# Patient Record
Sex: Female | Born: 1995 | Race: White | Hispanic: No | Marital: Single | Smoking: Never smoker
Health system: Southern US, Community
[De-identification: ages and names within clinical notes are randomized; demographics above are authoritative.]

## PROBLEM LIST (undated history)

## (undated) DIAGNOSIS — F329 Major depressive disorder, single episode, unspecified: Secondary | ICD-10-CM

## (undated) DIAGNOSIS — G43909 Migraine, unspecified, not intractable, without status migrainosus: Secondary | ICD-10-CM

## (undated) DIAGNOSIS — F419 Anxiety disorder, unspecified: Secondary | ICD-10-CM

## (undated) DIAGNOSIS — F410 Panic disorder [episodic paroxysmal anxiety] without agoraphobia: Secondary | ICD-10-CM

## (undated) DIAGNOSIS — F32A Depression, unspecified: Secondary | ICD-10-CM

## (undated) HISTORY — PX: ESOPHAGOGASTRODUODENOSCOPY: SHX1529

## (undated) HISTORY — PX: CHOLECYSTECTOMY: SHX55

---
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
## 2015-03-29 DIAGNOSIS — Z8659 Personal history of other mental and behavioral disorders: Secondary | ICD-10-CM

## 2015-03-29 DIAGNOSIS — F840 Autistic disorder: Secondary | ICD-10-CM | POA: Insufficient documentation

## 2015-03-29 HISTORY — DX: Personal history of other mental and behavioral disorders: Z86.59

## 2015-11-13 DIAGNOSIS — E282 Polycystic ovarian syndrome: Secondary | ICD-10-CM | POA: Insufficient documentation

## 2015-11-13 DIAGNOSIS — R5383 Other fatigue: Secondary | ICD-10-CM | POA: Insufficient documentation

## 2016-11-07 DIAGNOSIS — F410 Panic disorder [episodic paroxysmal anxiety] without agoraphobia: Secondary | ICD-10-CM | POA: Insufficient documentation

## 2016-11-07 DIAGNOSIS — F4000 Agoraphobia, unspecified: Secondary | ICD-10-CM

## 2016-11-07 HISTORY — DX: Agoraphobia, unspecified: F40.00

## 2017-04-20 DIAGNOSIS — F339 Major depressive disorder, recurrent, unspecified: Secondary | ICD-10-CM | POA: Insufficient documentation

## 2017-04-20 DIAGNOSIS — F330 Major depressive disorder, recurrent, mild: Secondary | ICD-10-CM | POA: Insufficient documentation

## 2017-07-18 ENCOUNTER — Inpatient Hospital Stay (HOSPITAL_COMMUNITY)
Admission: AD | Admit: 2017-07-18 | Discharge: 2017-07-23 | DRG: 885 | Disposition: A | Discharge status: Home / Self Care | Payer: Medicaid Other | Source: Intra-hospital | Attending: Psychiatry | Admitting: Psychiatry

## 2017-07-18 ENCOUNTER — Emergency Department (HOSPITAL_COMMUNITY)
Admission: EM | Admit: 2017-07-18 | Discharge: 2017-07-18 | Disposition: A | Discharge status: Other Acute Inpatient Hospital | Payer: Medicaid Other | Attending: Emergency Medicine | Admitting: Emergency Medicine

## 2017-07-18 ENCOUNTER — Encounter (HOSPITAL_COMMUNITY): Payer: Self-pay

## 2017-07-18 ENCOUNTER — Encounter (HOSPITAL_COMMUNITY): Payer: Self-pay | Admitting: Emergency Medicine

## 2017-07-18 ENCOUNTER — Other Ambulatory Visit: Payer: Self-pay

## 2017-07-18 DIAGNOSIS — R45851 Suicidal ideations: Secondary | ICD-10-CM | POA: Diagnosis present

## 2017-07-18 DIAGNOSIS — F419 Anxiety disorder, unspecified: Secondary | ICD-10-CM | POA: Diagnosis not present

## 2017-07-18 DIAGNOSIS — F840 Autistic disorder: Secondary | ICD-10-CM | POA: Diagnosis present

## 2017-07-18 DIAGNOSIS — F1099 Alcohol use, unspecified with unspecified alcohol-induced disorder: Secondary | ICD-10-CM | POA: Diagnosis not present

## 2017-07-18 DIAGNOSIS — R51 Headache: Secondary | ICD-10-CM | POA: Diagnosis not present

## 2017-07-18 DIAGNOSIS — R440 Auditory hallucinations: Secondary | ICD-10-CM | POA: Diagnosis not present

## 2017-07-18 DIAGNOSIS — F28 Other psychotic disorder not due to a substance or known physiological condition: Secondary | ICD-10-CM | POA: Diagnosis present

## 2017-07-18 DIAGNOSIS — F29 Unspecified psychosis not due to a substance or known physiological condition: Secondary | ICD-10-CM

## 2017-07-18 DIAGNOSIS — F329 Major depressive disorder, single episode, unspecified: Secondary | ICD-10-CM | POA: Insufficient documentation

## 2017-07-18 DIAGNOSIS — G43909 Migraine, unspecified, not intractable, without status migrainosus: Secondary | ICD-10-CM | POA: Diagnosis present

## 2017-07-18 DIAGNOSIS — G4700 Insomnia, unspecified: Secondary | ICD-10-CM | POA: Diagnosis present

## 2017-07-18 DIAGNOSIS — R519 Headache, unspecified: Secondary | ICD-10-CM

## 2017-07-18 DIAGNOSIS — F23 Brief psychotic disorder: Secondary | ICD-10-CM

## 2017-07-18 DIAGNOSIS — F410 Panic disorder [episodic paroxysmal anxiety] without agoraphobia: Secondary | ICD-10-CM | POA: Diagnosis present

## 2017-07-18 DIAGNOSIS — F411 Generalized anxiety disorder: Secondary | ICD-10-CM | POA: Insufficient documentation

## 2017-07-18 HISTORY — DX: Panic disorder (episodic paroxysmal anxiety): F41.0

## 2017-07-18 HISTORY — DX: Major depressive disorder, single episode, unspecified: F32.9

## 2017-07-18 HISTORY — DX: Migraine, unspecified, not intractable, without status migrainosus: G43.909

## 2017-07-18 HISTORY — DX: Unspecified psychosis not due to a substance or known physiological condition: F29

## 2017-07-18 HISTORY — DX: Depression, unspecified: F32.A

## 2017-07-18 HISTORY — DX: Anxiety disorder, unspecified: F41.9

## 2017-07-18 LAB — COMPREHENSIVE METABOLIC PANEL
ALBUMIN: 4.3 g/dL (ref 3.5–5.0)
ALK PHOS: 62 U/L (ref 38–126)
ALT: 21 U/L (ref 14–54)
AST: 24 U/L (ref 15–41)
Anion gap: 13 (ref 5–15)
BILIRUBIN TOTAL: 1 mg/dL (ref 0.3–1.2)
BUN: 10 mg/dL (ref 6–20)
CO2: 24 mmol/L (ref 22–32)
CREATININE: 1.02 mg/dL — AB (ref 0.44–1.00)
Calcium: 9.9 mg/dL (ref 8.9–10.3)
Chloride: 103 mmol/L (ref 101–111)
GFR calc Af Amer: >60 mL/min (ref >60)
GFR calc non Af Amer: >60 mL/min (ref >60)
GLUCOSE: 99 mg/dL (ref 65–99)
POTASSIUM: 4.1 mmol/L (ref 3.5–5.1)
Sodium: 140 mmol/L (ref 135–145)
TOTAL PROTEIN: 6.8 g/dL (ref 6.5–8.1)

## 2017-07-18 LAB — RAPID URINE DRUG SCREEN, HOSP PERFORMED
AMPHETAMINES: NOT DETECTED
BARBITURATES: NOT DETECTED
Benzodiazepines: POSITIVE — AB
Cocaine: NOT DETECTED
OPIATES: NOT DETECTED
Tetrahydrocannabinol: NOT DETECTED

## 2017-07-18 LAB — CBC
HEMATOCRIT: 44.5 % (ref 36.0–46.0)
Hemoglobin: 15.2 g/dL — ABNORMAL HIGH (ref 12.0–15.0)
MCH: 30.3 pg (ref 26.0–34.0)
MCHC: 34.2 g/dL (ref 30.0–36.0)
MCV: 88.6 fL (ref 78.0–100.0)
PLATELETS: 222 10*3/uL (ref 150–400)
RBC: 5.02 MIL/uL (ref 3.87–5.11)
RDW: 11.8 % (ref 11.5–15.5)
WBC: 7.9 10*3/uL (ref 4.0–10.5)

## 2017-07-18 LAB — I-STAT BETA HCG BLOOD, ED (MC, WL, AP ONLY)

## 2017-07-18 LAB — ACETAMINOPHEN LEVEL: Acetaminophen (Tylenol), Serum: <10 ug/mL — ABNORMAL LOW (ref 10–30)

## 2017-07-18 LAB — ETHANOL: Alcohol, Ethyl (B): <10 mg/dL (ref <10)

## 2017-07-18 LAB — SALICYLATE LEVEL: Salicylate Lvl: <7 mg/dL (ref 2.8–30.0)

## 2017-07-18 MED ORDER — IBUPROFEN 400 MG PO TABS
600.0000 mg | ORAL_TABLET | Freq: Four times a day (QID) | ORAL | Status: DC | PRN
  Administered 2017-07-19 – 2017-07-21 (×4): 600 mg via ORAL
  Filled 2017-07-18 (×5): qty 1

## 2017-07-18 MED ORDER — DIVALPROEX SODIUM ER 500 MG PO TB24
750.0000 mg | ORAL_TABLET | Freq: Every day | ORAL | Status: DC
  Administered 2017-07-18 – 2017-07-22 (×5): 750 mg via ORAL
  Filled 2017-07-18 (×7): qty 1

## 2017-07-18 MED ORDER — QUETIAPINE FUMARATE 25 MG PO TABS
25.0000 mg | ORAL_TABLET | Freq: Once | ORAL | Status: AC
  Administered 2017-07-18: 25 mg via ORAL
  Filled 2017-07-18 (×2): qty 1

## 2017-07-18 MED ORDER — METOCLOPRAMIDE HCL 5 MG/ML IJ SOLN
10.0000 mg | Freq: Once | INTRAMUSCULAR | Status: AC
  Administered 2017-07-18: 10 mg via INTRAVENOUS
  Filled 2017-07-18: qty 2

## 2017-07-18 MED ORDER — DIPHENHYDRAMINE HCL 50 MG/ML IJ SOLN
25.0000 mg | Freq: Once | INTRAMUSCULAR | Status: AC
  Administered 2017-07-18: 25 mg via INTRAVENOUS
  Filled 2017-07-18: qty 1

## 2017-07-18 MED ORDER — QUETIAPINE FUMARATE 100 MG PO TABS
100.0000 mg | ORAL_TABLET | Freq: Every day | ORAL | Status: DC
  Administered 2017-07-18: 100 mg via ORAL
  Filled 2017-07-18 (×3): qty 1

## 2017-07-18 MED ORDER — ENSURE ENLIVE PO LIQD
237.0000 mL | Freq: Two times a day (BID) | ORAL | Status: DC
  Administered 2017-07-18 – 2017-07-22 (×10): 237 mL via ORAL
  Filled 2017-07-18 (×3): qty 237

## 2017-07-18 MED ORDER — KETOROLAC TROMETHAMINE 30 MG/ML IJ SOLN
30.0000 mg | Freq: Once | INTRAMUSCULAR | Status: AC
  Administered 2017-07-18: 30 mg via INTRAVENOUS
  Filled 2017-07-18: qty 1

## 2017-07-18 MED ORDER — VALPROATE SODIUM 500 MG/5ML IV SOLN
500.0000 mg | Freq: Once | INTRAVENOUS | Status: AC
  Administered 2017-07-18: 500 mg via INTRAVENOUS
  Filled 2017-07-18: qty 5

## 2017-07-18 MED ORDER — LORAZEPAM 1 MG PO TABS
1.0000 mg | ORAL_TABLET | Freq: Four times a day (QID) | ORAL | Status: DC | PRN
Start: 2017-07-18 — End: 2017-07-19
  Administered 2017-07-18 – 2017-07-19 (×2): 1 mg via ORAL
  Filled 2017-07-18 (×2): qty 1

## 2017-07-18 MED ORDER — SODIUM CHLORIDE 0.9 % IV BOLUS
1000.0000 mL | Freq: Once | INTRAVENOUS | Status: AC
  Administered 2017-07-18: 1000 mL via INTRAVENOUS

## 2017-07-18 MED ORDER — FLUOXETINE HCL 20 MG PO CAPS
60.0000 mg | ORAL_CAPSULE | Freq: Every day | ORAL | Status: DC
  Administered 2017-07-18 – 2017-07-19 (×2): 60 mg via ORAL
  Filled 2017-07-18 (×5): qty 3

## 2017-07-18 MED ORDER — SUMATRIPTAN SUCCINATE 100 MG PO TABS
100.0000 mg | ORAL_TABLET | ORAL | Status: DC | PRN
  Administered 2017-07-19 – 2017-07-21 (×5): 100 mg via ORAL
  Filled 2017-07-18 (×3): qty 2
  Filled 2017-07-18: qty 1
  Filled 2017-07-18 (×2): qty 2

## 2017-07-18 MED ORDER — HYDROXYZINE HCL 25 MG PO TABS
25.0000 mg | ORAL_TABLET | ORAL | Status: DC | PRN
  Administered 2017-07-19 – 2017-07-21 (×4): 25 mg via ORAL
  Filled 2017-07-18 (×5): qty 1

## 2017-07-18 MED ORDER — PROCHLORPERAZINE MALEATE 5 MG PO TABS
10.0000 mg | ORAL_TABLET | Freq: Four times a day (QID) | ORAL | Status: DC | PRN
  Administered 2017-07-18 – 2017-07-20 (×4): 10 mg via ORAL
  Filled 2017-07-18 (×4): qty 1

## 2017-07-18 MED ORDER — DEXAMETHASONE SODIUM PHOSPHATE 10 MG/ML IJ SOLN
10.0000 mg | Freq: Once | INTRAMUSCULAR | Status: AC
  Administered 2017-07-18: 10 mg via INTRAVENOUS
  Filled 2017-07-18: qty 1

## 2017-07-18 NOTE — Tx Team (Signed)
Initial Treatment Plan 07/18/2017 1:45 PM [name] ZOX:[id number]    PATIENT STRESSORS: Health problems   PATIENT STRENGTHS: Financial means Motivation for treatment/growth Supportive family/friends   PATIENT IDENTIFIED PROBLEMS: Chronic migraine for past 2 months  Increasing depression and suicidal thoughts                   DISCHARGE CRITERIA:  Ability to meet basic life and health needs Improved stabilization in mood, thinking, and/or behavior  PRELIMINARY DISCHARGE PLAN: Outpatient therapy Return to previous living arrangement  PATIENT/FAMILY INVOLVEMENT: This treatment plan has been presented to and reviewed with the patient, Kammy Klett, and/or family member, Tria Noguera (mother).  The patient and family have been given the opportunity to ask questions and make suggestions.  Kirstie Mirza, RN 07/18/2017, 1:45 PM

## 2017-07-18 NOTE — [facility] Group Notes (Signed)
Life SKills   Date:  07/18/2017  Time:  4:08 PM  Type of Therapy:  Nurse Education  /  The group focuses on teaching patients how to identify their needs and then how to develp the skills needed to get them met.   Participation Level:  Active  Participation Quality:  Attentive  Affect:  Appropriate  Cognitive:  Appropriate  Insight:  Appropriate  Engagement in Group:  Engaged  Modes of Intervention:  Education  Summary of Progress/Problems:  Betty Rogers 07/18/2017, 4:08 PM

## 2017-07-18 NOTE — Progress Notes (Signed)
Per Julieanne Cotton, pt has been accepted to [facility] bed 403-1. Accepting provider is Shuvon Rankin.  Attending provider is [place]. Patient can arrive by 10:00 A.M. Number for report is [phone].  CSW has called [employer] with accepting information.   Moss Mc MSW, LCSW-A, LCAS-A Clinical Social Worker 07/18/2017 9:24 AM

## 2017-07-18 NOTE — H&P (Signed)
Psychiatric Admission Assessment Adult  Patient Identification: Betty Rogers MRN:  [id number] Date of Evaluation:  07/18/2017 Chief Complaint:  MDD with psychotic features Principal Diagnosis: <principal problem not specified> Diagnosis:   Patient Active Problem List   Diagnosis Date Noted  . Schizoaffective disorder ([place]) [F25.9] 07/18/2017   History of Present Illness: Patient is seen and examined.  Patient is a 22 year old female with a reported past psychiatric history significant for autism spectrum disorder, unspecified depression, unspecified anxiety, unspecified panic attacks, unspecified psychosis, migraine headaches and apparently tinnitus who originally presented to the [facility] emergency department with a chronic migraine headache.  Apparently the patient has had a migraine for the last several weeks to months.  She apparently had a medical hospitalization at [facility] approximately a week ago for this headache.  Apparently that hospitalization was not successful.  She presented to the emergency department with her mother on 5/18.  He was also for a headache evaluation.  The patient wanted her to have an MRI at that time.  The emergency room physician did not believe it was indicated.  During the course of the evaluation the patient also related having auditory and visual hallucinations.  There was also suicidal ideation with a threat to jump out of the car because the headache was so bad.  The ER gave the patient IV Depakote in the emergency room.  At the time of this evaluation in the psychiatric unit she denied having an active headache.  She did state that she was having auditory hallucinations.  Patient has autism spectrum disorder, and also appears to have some intellectual deficits.  She is not a very good historian.  The emergency room record showed the patient apparently did not have a psychiatric provider.  I contacted the patient's mother and got some collateral  history.  She is seen by psychiatric practice in [place].  It is a neuropsychiatric practice.  She stated that they had seen the provider approximately a month ago.  The patient was having the headache at that time, but "they did nothing".  The patient's mother was somewhat disorganized about the patient's medications.  We attempted to contact the [facility] pharmacy, but they were already closed.  The mother stated the patient had been taking Seroquel, and "had been on everything in the past".  When the mother delivered the list of medications to the nursing staff Seroquel was not on that record.  Review of chart everywhere showed she had previously been treated with Topamax, Seroquel, Prozac.  Her current Prozac dose was 80 mg p.o. daily.  Apparently that is not been successful.  The mother stated she was having panic attacks multiple times a day.  Review of chart everywhere is showed that the patient had been taking 300 mg of Seroquel at least in 2017.  She was admitted to the hospital for evaluation and stabilization. Associated Signs/Symptoms: Depression Symptoms:  depressed mood, anhedonia, psychomotor agitation, fatigue, feelings of worthlessness/guilt, difficulty concentrating, hopelessness, suicidal thoughts without plan, anxiety, panic attacks, loss of energy/fatigue, (Hypo) Manic Symptoms:  Impulsivity, Anxiety Symptoms:  Excessive Worry, Psychotic Symptoms:  Hallucinations: Auditory PTSD Symptoms: Negative Total Time spent with patient: 1 hour  Past Psychiatric History: Apparently the patient has not had any psychiatric hospitalizations in the past.  This would be the first.  She has been followed psychiatrically.  It appears as though she had been seen at the [facility] for her autism spectrum disorder.  Is the patient at risk  to self? No.  Has the patient been a risk to self in the past 6 months? Yes.    Has the patient been a risk to self  within the distant past? No.  Is the patient a risk to others? No.  Has the patient been a risk to others in the past 6 months? No.  Has the patient been a risk to others within the distant past? No.   Prior Inpatient Therapy:   Prior Outpatient Therapy:    Alcohol Screening: 1. How often do you have a drink containing alcohol?: Monthly or less 2. How many drinks containing alcohol do you have on a typical day when you are drinking?: 1 or 2 3. How often do you have six or more drinks on one occasion?: Never AUDIT-C Score: 1 4. How often during the last year have you found that you were not able to stop drinking once you had started?: Never 5. How often during the last year have you failed to do what was normally expected from you becasue of drinking?: Never 6. How often during the last year have you needed a first drink in the morning to get yourself going after a heavy drinking session?: Never 7. How often during the last year have you had a feeling of guilt of remorse after drinking?: Never 8. How often during the last year have you been unable to remember what happened the night before because you had been drinking?: Never 9. Have you or someone else been injured as a result of your drinking?: No 10. Has a relative or friend or a doctor or another health worker been concerned about your drinking or suggested you cut down?: No Alcohol Use Disorder Identification Test Final Score (AUDIT): 1 Substance Abuse History in the last 12 months:  No. Consequences of Substance Abuse: Negative Previous Psychotropic Medications: Yes  Psychological Evaluations: Yes  Past Medical History:  Past Medical History:  Diagnosis Date  . Anxiety and depression   . Migraines   . Panic attacks     Past Surgical History:  Procedure Laterality Date  . CHOLECYSTECTOMY    . CHOLECYSTECTOMY     Family History: History reviewed. No pertinent family history. Family Psychiatric  History: Apparently there is a  history of bipolar disorder in her family. Tobacco Screening: Have you used any form of tobacco in the last 30 days? (Cigarettes, Smokeless Tobacco, Cigars, and/or Pipes): No Social History:  Social History   Substance and Sexual Activity  Alcohol Use Yes  . Frequency: Never   Comment: "a half a wine cooler occasionally"     Social History   Substance and Sexual Activity  Drug Use Never    Additional Social History: Marital status: Single Are you sexually active?: Yes What is your sexual orientation?: Bisexual - prefer guys. Has your sexual activity been affected by drugs, alcohol, medication, or emotional stress?: N/A Does patient have children?: No                         Allergies:   Allergies  Allergen Reactions  . Nickel     Rash  . Other Itching    Metal    Lab Results:  Results for orders placed or performed during the hospital encounter of 07/18/17 (from the past 48 hour(s))  Comprehensive metabolic panel     Status: Abnormal   Collection Time: 07/18/17  5:35 AM  Result Value Ref Range   Sodium 140 135 -  145 mmol/L   Potassium 4.1 3.5 - 5.1 mmol/L   Chloride 103 101 - 111 mmol/L   CO2 24 22 - 32 mmol/L   Glucose, Bld 99 65 - 99 mg/dL   BUN 10 6 - 20 mg/dL   Creatinine, Ser 1.02 (H) 0.44 - 1.00 mg/dL   Calcium 9.9 8.9 - 10.3 mg/dL   Total Protein 6.8 6.5 - 8.1 g/dL   Albumin 4.3 3.5 - 5.0 g/dL   AST 24 15 - 41 U/L   ALT 21 14 - 54 U/L   Alkaline Phosphatase 62 38 - 126 U/L   Total Bilirubin 1.0 0.3 - 1.2 mg/dL   GFR calc non Af Amer >60 >60 mL/min   GFR calc Af Amer >60 >60 mL/min    Comment: (NOTE) The eGFR has been calculated using the CKD EPI equation. This calculation has not been validated in all clinical situations. eGFR's persistently <60 mL/min signify possible Chronic Kidney Disease.    Anion gap 13 5 - 15    Comment: Performed at [facility] [address] [place], [place] [address]  Ethanol     Status: None   Collection  Time: 07/18/17  5:35 AM  Result Value Ref Range   Alcohol, Ethyl (B) <10 <10 mg/dL    Comment: (NOTE) Lowest detectable limit for serum alcohol is 10 mg/dL. For medical purposes only. Performed at [facility], [place] [address], [place], [place] [address]   Salicylate level     Status: None   Collection Time: 07/18/17  5:35 AM  Result Value Ref Range   Salicylate Lvl <0.6 2.8 - 30.0 mg/dL    Comment: Performed at [facility] [address], [place], [place] [address]  Acetaminophen level     Status: Abnormal   Collection Time: 07/18/17  5:35 AM  Result Value Ref Range   Acetaminophen (Tylenol), Serum <10 (L) 10 - 30 ug/mL    Comment: (NOTE) Therapeutic concentrations vary significantly. A range of 10-30 ug/mL  may be an effective concentration for many patients. However, some  are best treated at concentrations outside of this range. Acetaminophen concentrations >150 ug/mL at 4 hours after ingestion  and >50 ug/mL at 12 hours after ingestion are often associated with  toxic reactions. Performed at [facility], [place] [address] [place], [place] [address]   cbc     Status: Abnormal   Collection Time: 07/18/17  5:35 AM  Result Value Ref Range   WBC 7.9 4.0 - 10.5 K/uL   RBC 5.02 3.87 - 5.11 MIL/uL   Hemoglobin 15.2 (H) 12.0 - 15.0 g/dL   HCT 44.5 36.0 - 46.0 %   MCV 88.6 78.0 - 100.0 fL   MCH 30.3 26.0 - 34.0 pg   MCHC 34.2 30.0 - 36.0 g/dL   RDW 11.8 11.5 - 15.5 %   Platelets 222 150 - 400 K/uL    Comment: Performed at [facility], [place] [address], [place], [place] [address]  Rapid urine drug screen (hospital performed)     Status: Abnormal   Collection Time: 07/18/17  5:38 AM  Result Value Ref Range   Opiates NONE DETECTED NONE DETECTED   Cocaine NONE DETECTED NONE DETECTED   Benzodiazepines POSITIVE (A) NONE DETECTED   Amphetamines NONE DETECTED NONE DETECTED   Tetrahydrocannabinol NONE DETECTED NONE DETECTED   Barbiturates NONE DETECTED NONE  DETECTED    Comment: (NOTE) DRUG SCREEN FOR MEDICAL PURPOSES ONLY.  IF CONFIRMATION IS NEEDED FOR ANY PURPOSE, NOTIFY LAB WITHIN 5 DAYS. LOWEST DETECTABLE LIMITS FOR URINE DRUG SCREEN Drug Class                     Cutoff (ng/mL) Amphetamine and metabolites    1000 Barbiturate and metabolites    200 Benzodiazepine                 191 Tricyclics and metabolites     300 Opiates and metabolites        300 Cocaine and metabolites        300 THC                            50 Performed at [facility], [place] [address], [place], [place] [address]   I-Stat beta hCG blood, ED     Status: None   Collection Time: 07/18/17  5:49 AM  Result Value Ref Range   I-stat hCG, quantitative <5.0 <5 mIU/mL   Comment 3            Comment:   GEST. AGE      CONC.  (mIU/mL)   <=1 WEEK        5 - 50     2 WEEKS       50 - 500     3 WEEKS       100 - 10,000     4 WEEKS     1,000 - 30,000        FEMALE AND NON-PREGNANT FEMALE:     LESS THAN 5 mIU/mL     Blood Alcohol level:  Lab Results  Component Value Date   ETH <10 04/59/9774    Metabolic Disorder Labs:  No results found for: HGBA1C, MPG No results found for: PROLACTIN No results found for: CHOL, TRIG, HDL, CHOLHDL, VLDL, LDLCALC  Current Medications: Current Facility-Administered Medications  Medication Dose Route Frequency Provider Last Rate Last Dose  . divalproex (DEPAKOTE ER) 24 hr tablet 750 mg  750 mg Oral Q supper Sharma Covert, MD      . feeding supplement (ENSURE ENLIVE) (ENSURE ENLIVE) liquid 237 mL  237 mL Oral BID BM Sharma Covert, MD   237 mL at 07/18/17 1450  . FLUoxetine (PROZAC) capsule 60 mg  60 mg Oral Daily Sharma Covert, MD      . hydrOXYzine (ATARAX/VISTARIL) tablet 25 mg  25 mg Oral Q4H PRN Sharma Covert, MD      . ibuprofen (ADVIL,MOTRIN) tablet 600 mg  600 mg Oral Q6H PRN Rankin, Shuvon B, NP      . LORazepam (ATIVAN) tablet 1 mg  1 mg Oral Q6H PRN Sharma Covert, MD      .  prochlorperazine (COMPAZINE) tablet 10 mg  10 mg Oral Q6H PRN Sharma Covert, MD      . QUEtiapine (SEROQUEL) tablet 100 mg  100 mg Oral QHS Sharma Covert, MD      . QUEtiapine (SEROQUEL) tablet 25 mg  25 mg Oral Once Sharma Covert, MD      . SUMAtriptan Dellis Filbert) tablet 100 mg  100 mg Oral Q2H PRN Sharma Covert, MD       PTA Medications: Medications Prior to Admission  Medication Sig Dispense Refill Last Dose  . divalproex (DEPAKOTE) 500 MG DR tablet Take 500 mg by mouth 2 (two) times daily.  07/17/2017 at Unknown time  . FLUoxetine (PROZAC) 40 MG capsule Take 40 mg by mouth daily.   07/17/2017 at Unknown time  . hydrOXYzine (ATARAX/VISTARIL) 10 MG tablet Take 10 mg by mouth 2 (two) times daily as needed for anxiety.   07/17/2017 at Unknown time  . LORazepam (ATIVAN) 1 MG tablet Take 1 mg by mouth 2 (two) times daily.   07/18/2017 at Unknown time  . prochlorperazine (COMPAZINE) 10 MG tablet Take 10 mg by mouth every 8 (eight) hours as needed for nausea or vomiting.   07/17/2017 at Unknown time  . SUMAtriptan (IMITREX) 100 MG tablet Take 50 mg by mouth every 2 (two) hours as needed for migraine. May repeat in 2 hours if headache persists or recurs.   07/18/2017 at Unknown time    Musculoskeletal: Strength & Muscle Tone: within normal limits Gait & Station: normal Patient leans: N/A  Psychiatric Specialty Exam: Physical Exam  Nursing note and vitals reviewed. Constitutional: She is oriented to person, place, and time. She appears well-developed and well-nourished.  HENT:  Head: Normocephalic and atraumatic.  Respiratory: Effort normal.  Neurological: She is alert and oriented to person, place, and time.    ROS  There were no vitals taken for this visit.There is no height or weight on file to calculate BMI.  General Appearance: Disheveled  Eye Contact:  Minimal  Speech:  Normal Rate  Volume:  Decreased  Mood:  Dysphoric  Affect:  Blunt  Thought Process:  Coherent   Orientation:  Full (Time, Place, and Person)  Thought Content:  Hallucinations: Auditory  Suicidal Thoughts:  Yes.  without intent/plan  Homicidal Thoughts:  No  Memory:  Immediate;   Fair  Judgement:  Impaired  Insight:  Lacking  Psychomotor Activity:  Increased  Concentration:  Concentration: Fair  Recall:  [employer] of Knowledge:  Fair  Language:  Fair  Akathisia:  Negative  Handed:  Right  AIMS (if indicated):     Assets:  Desire for Improvement  ADL's:  Intact  Cognition:  WNL  Sleep:       Treatment Plan Summary: Daily contact with patient to assess and evaluate symptoms and progress in treatment, Medication management and Plan Patient is seen and examined.  Patient is a 22 year old female with the above-stated past psychiatric history is admitted to the hospital because of suicidal ideation.  She has been previously treated with Seroquel, Topamax and Prozac.  Her current Prozac dosage was 80 mg, but apparently does not appear to be effective.  I am going to begin weaning her off of it.  We will decrease her dose to 60 mg a day today, and then hopefully get her off in the next couple of days to use a different antidepressant/antianxiety medicine.  Is unclear whether or not she is actively taking Seroquel, but I am going to go on and restarted at 100 mg p.o. nightly.  We will titrate that over the next several days.  We will also continue the Depakote at 750 mg p.o. nightly.  I am also going to increase her Ativan 1 mg p.o. 3 times daily as needed for anxiety.  We can continue the Vistaril.  Compazine and Imitrex will be available for migraine headache problems.  We will continue to try and contact her pharmacy to get an adequate current list of all of her medications and think she may have been treated with in the past.  On Monday we will contact her local psychiatric provider to  get a more clear history of what is been going on most recently.  Observation Level/Precautions:  15  minute checks  Laboratory:  Chemistry Profile  Psychotherapy:    Medications:    Consultations:    Discharge Concerns:    Estimated LOS:  Other:     Physician Treatment Plan for Primary Diagnosis: <principal problem not specified> Long Term Goal(s): Improvement in symptoms so as ready for discharge  Short Term Goals: Ability to identify changes in lifestyle to reduce recurrence of condition will improve, Ability to verbalize feelings will improve, Ability to disclose and discuss suicidal ideas, Ability to demonstrate self-control will improve, Ability to identify and develop effective coping behaviors will improve, Ability to maintain clinical measurements within normal limits will improve and Compliance with prescribed medications will improve  Physician Treatment Plan for Secondary Diagnosis: Active Problems:   Schizoaffective disorder ([place])  Long Term Goal(s): Improvement in symptoms so as ready for discharge  Short Term Goals: Ability to identify changes in lifestyle to reduce recurrence of condition will improve, Ability to verbalize feelings will improve, Ability to disclose and discuss suicidal ideas, Ability to demonstrate self-control will improve, Ability to identify and develop effective coping behaviors will improve, Ability to maintain clinical measurements within normal limits will improve and Compliance with prescribed medications will improve  I certify that inpatient services furnished can reasonably be expected to improve the patient's condition.    Sharma Covert, MD 5/18/20193:34 PM

## 2017-07-18 NOTE — ED Provider Notes (Addendum)
TIME SEEN: 6:08 AM  CHIEF COMPLAINT: Suicidal thoughts, command hallucinations, headache  HPI: Patient is a 22 year old female with history of depression, migraines who presents to the emergency department with complaints of suicidal thoughts for the past day with command hallucinations telling her to harm herself.  She states that on the way here she had a plan to jump out of the car.  No homicidal thoughts.  No history of previous suicide attempt.  Patient also complaining of a posterior headache.  Describes it as throbbing in nature and gradual in onset.  Feels different than her typical migraines because her migraines are usually in the top of her head.  She has had some tinnitus with this headache.  It does cause her to have nausea and vomiting.  Has tried Imitrex at home without relief.  Was seen at [facility] for the same and had a head CT which was unremarkable.  ROS: See HPI Constitutional: no fever  Eyes: no drainage  ENT: no runny nose   Cardiovascular:  no chest pain  Resp: no SOB  GI: no vomiting GU: no dysuria Integumentary: no rash  Allergy: no hives  Musculoskeletal: no leg swelling  Neurological: no slurred speech ROS otherwise negative  PAST MEDICAL HISTORY/PAST SURGICAL HISTORY:  Past Medical History:  Diagnosis Date  . Anxiety and depression   . Migraines   . Panic attacks     MEDICATIONS:  Prior to Admission medications   Not on File    ALLERGIES:  Not on File  SOCIAL HISTORY:  Social History   Tobacco Use  . Smoking status: Never Smoker  . Smokeless tobacco: Never Used  Substance Use Topics  . Alcohol use: Never    Frequency: Never    FAMILY HISTORY: No family history on file.  EXAM: BP 130/81 (BP Location: Right Arm)   Pulse (!) 113   Temp 98.5 F (36.9 C) (Oral)   Resp 20   Ht  (1.626 m)   Wt 74.8 kg (165 lb)   SpO2 100%   BMI 28.32 kg/m  CONSTITUTIONAL: Alert and oriented and responds appropriately to questions.  Well-appearing; well-nourished, pacing the hallway, in no significant distress, appears anxious HEAD: Normocephalic EYES: Conjunctivae clear, pupils appear equal, EOMI ENT: normal nose; moist mucous membranes NECK: Supple, no meningismus, no nuchal rigidity, no LAD  CARD: RRR; S1 and S2 appreciated; no murmurs, no clicks, no rubs, no gallops RESP: Normal chest excursion without splinting or tachypnea; breath sounds clear and equal bilaterally; no wheezes, no rhonchi, no rales, no hypoxia or respiratory distress, speaking full sentences ABD/GI: Normal bowel sounds; non-distended; soft, non-tender, no rebound, no guarding, no peritoneal signs, no hepatosplenomegaly BACK:  The back appears normal and is non-tender to palpation, there is no CVA tenderness EXT: Normal ROM in all joints; non-tender to palpation; no edema; normal capillary refill; no cyanosis, no calf tenderness or swelling    SKIN: Normal color for age and race; warm; no rash NEURO: Moves all extremities equally, normal sensation diffusely, cranial nerves II through XII intact, normal speech, normal gait PSYCH: Patient here with suicidal thoughts with plan.  Has command hallucinations.  No HI.  MEDICAL DECISION MAKING: Patient here with complaints of headache.  I feel that this seems like a migraine but mother disagrees.  Mother is demanding an MRI of the patient's brain in the emergency department.  I discussed with her that given no fever, focal neurologic deficits, head injury and not being on blood thinners that  at this time she does not need an emergent MRI but this can be done as an outpatient.  Mother is upset with this plan and states "that is going to take [place] months".  Have recommended close outpatient neurology follow-up.  We will treat headache with migraine cocktail (Toradol, Reglan, Benadryl, Decadron, IV fluid).   As for patient's suicidal ideation, will obtain screening labs and urine and consult TTS.  She is here  voluntarily.  I feel she would benefit from inpatient psychiatric admission.  ED PROGRESS: Patient's work-up unremarkable other than drug screen being positive for benzodiazepines.  At this time patient is medically cleared.  Awaiting TTS consult.   I reviewed all nursing notes, vitals, pertinent previous records, EKGs, lab and urine results, imaging (as available).     Corrin Hingle, Layla Maw, DO 07/18/17 0705   7:30 AM  Pt still complaining of headache after migraine cocktail.  Will give IV Depakote.  I am not concerned for any life-threatening illness at this time.    Siana Panameno, Layla Maw, DO 07/18/17 0732    Persephanie Laatsch, Layla Maw, DO 07/18/17 1610

## 2017-07-18 NOTE — Progress Notes (Addendum)
D: Patient arrived today to [facility] from [facility] ED as voluntary patient. Patient reports chronic migraine lasting about 2 months, which has not resolved with treatment. She has become increasingly depressed due to the level of pain (10/10), with suicidal thoughts and a plan to stab herself with a knife or jump in front of a car. Patient reports command auditory hallucinations telling herself to harm herself, but denies having suicidal thoughts of her own. Patient reports poor appetite, panic attacks, and no sleep for past 2 days. Her mother is her guardian. Patient has prior diagnosis of MDD, treated with prozac. UDS positive for benzos, however, mother states she takes Ativan twice daily. Patient reports tinnitus. A: Skin assessment performed by Clayborne Dana, RN per protocol, no contraband noted. Skin unremarkable.  Patient had no belongings at time of admission. Unit orientation completed. Care plan and unit routines reviewed with patient, understanding verbalized. Emotional support offered to patient. Encouraged patient to voice concerns. Fluids offered to patient. Q15 minute checks initiated for safety on and off unit. Notified and updated her guardian/mother. R: Patient acknowledged understanding. Patient contracts for safety. Continuing to have SI, with no plan or intent at this time.

## 2017-07-18 NOTE — BHH Group Notes (Signed)
Life SKills   Date:18 Jul 2017  The group focuses on teaching patients how to identify their needs and then hwo to develop skills needed to get those needs met.  Participation Level:  Active  Participation Quality:  Attentive  Affect:  Defensive  Cognitive:  Appropriate  Insight:  Improving  Engagement in Group:  Engaged  Modes of Intervention:  Education  Summary of Progress/Problems:  Lauralyn Primes 07/18/2017, 4:11 PM

## 2017-07-18 NOTE — BHH Counselor (Signed)
Adult Comprehensive Assessment  Patient ID: Betty Rogers, female   DOB: 1996-01-30, 22 y.o.   MRN: [id number]  Information Source: Information source: Patient  Current Stressors:  Educational / Learning stressors: Pt reports it is stressful but is not in school now.  Employment / Job issues: Pt reports I can't hold a job because I get stressed out.  Family Relationships: My younger brother and I struggle. Financial / Lack of resources (include bankruptcy): Pt reports she has social security so that covers my needs. Housing / Lack of housing: Pt pays rent to mom and dad. Pt likes housing arrangements.  Physical health (include injuries & life threatening diseases): Headaches (daily) , voices (daily) and constant rigning in my ear.  Social relationships: I don't have much since graduating from [employer]. I used to have one friend before I moved here from [place].  Substance abuse: Denies Bereavement / Loss: Denies   Living/Environment/Situation:  Living Arrangements: Parent Living conditions (as described by patient or guardian): Like where I live, safe environment.  How long has patient lived in current situation?: 8 years What is atmosphere in current home: Comfortable, Loving  Family History:  Marital status: Single Are you sexually active?: Yes What is your sexual orientation?: Bisexual - prefer guys. Has your sexual activity been affected by drugs, alcohol, medication, or emotional stress?: N/A Does patient have children?: No  Childhood History:  By whom was/is the patient raised?: Mother, Father Description of patient's relationship with caregiver when they were a child: Mom - very close. Dad - not as close as mom but still closed  Patient's description of current relationship with people who raised him/her: Mom - she really close. Dad- the same.  How were you disciplined when you got in trouble as a child/adolescent?: grounding, and spankings when younger Does patient  have siblings?: Yes Number of Siblings: 3 Description of patient's current relationship with siblings: Younger brother - struggle. Older - close. Middle brother - not as close as the oldest but closer then younger brother.  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   Employment/Work Situation:   Employment situation: On disability Why is patient on disability: I can't take care of myself when situations come up.  How long has patient been on disability: 2 months What is the longest time patient has a held a job?: 2.5 years Where was the patient employed at that time?: [employer]  Has patient ever been in the [employer]?: No Are There Guns or Other Weapons in Your Home?: Yes Types of Guns/Weapons: I don't know. They are locked in a safe.  Are These Weapons Safely Secured?: Yes  Financial Resources:   Financial resources: Support from parents / caregiver(Disability) Does patient have a representative payee or guardian?: Yes Name of representative payee or guardian: It's my mom. Betty Rogers  Alcohol/Substance Abuse:   What has been your use of drugs/alcohol within the last 12 months?: None.   Social Support System:   Patient's Community Support System: Good Describe Community Support System: Mom, dad, brothers  Type of faith/religion: No  Leisure/Recreation:   Leisure and Hobbies: draw, play games  Strengths/Needs:   What things does the patient do well?: draw,  card games In what areas does patient struggle / problems for patient: drawing, being able to focus better.   Discharge Plan:   Does patient have access to transportation?: Yes(Mom and dad will come. ) Will patient be returning to same living situation after discharge?: Yes Currently receiving community mental health services: Yes (From Whom)(therapist - Burna Mortimer (doesnt know the agency in [place]), ) If no, would patient like referral for services when discharged?: No Does patient have financial barriers related to discharge medications?: No  Summary/Recommendations:   Summary and Recommendations (to be completed by the evaluator): Patient is a 22 year old female with history of depression, migraines who presents to the emergency department with complaints of suicidal thoughts for the past day with command hallucinations telling her to harm herself.  She states that on the way to the hospital she had a plan to jump out of the car. Patient has a history significant for autism spectrum disorder, unspecified depression, unspecified anxiety, unspecified panic attacks, and migraine headaches. Patient reports her mother, Betty Rogers to be her guardian and payee. Primary stressors include: learning challenges, daily headaches and hearing voices as well as a constant ringing in her ear. Patient shared she has some family relationships that are strained and reports no friendships since completing high school. Patient denies any substance use. Patient will benefit from crisis stabilization, medication evaluation, group therapy and psychoeducation, in addition to case management for discharge planning. At discharge it is recommended that Patient adhere to the established discharge plan and continue in treatment.  Betty Rogers. 07/18/2017

## 2017-07-18 NOTE — ED Notes (Signed)
Staffing notified of need for sitter.  Belonging given to parents.  They remain at the bedside.  Encouraged to call for assistance as needed.

## 2017-07-18 NOTE — ED Notes (Signed)
Mother, Mardelle Pandolfi, also Legal Guardian and pt voiced understanding and agreement w/tx plan - Pt accepted to [facility] [id number]. Mother signed consent form - copy faxed to [facility], copy sent to Medical Records, and original placed in envelope for [facility].

## 2017-07-18 NOTE — BH Assessment (Addendum)
Assessment Note  Betty Rogers is an 22 y.o. female. Patient presents to [facility] voluntarily accompanied by her parents, Read Drivers & Evaline Waltman. Pt endorses chronic migraines with command hallucinations. She says for the past two days her headache has become more severe. She reports she hears voices. She says she hears multiple voices and they tell her to kill herself. AH command her to "grab a knife" and "jump out of the car". She reports she thought about jumping out of car on the drive to [facility] this am.  Pt reports she doesn't feel safe and wants to come into a psych hospital. She has no history of inpatient mental health treatment. She isn't seeing any outpatient mental health providers currently. Pt reports frequent panic attacks with most recent panic attack this am. She says for the past two days she has avoided going into the kitchen for fear she would hurt herself with a knife. She endorses fatigue, isolating behavior, anhedonia (she used to like to draw and play video games), irritability. She reports she also hears a "humming" sound which switches from ear to ear. She reports poor appetite with nausea. No delusions noted. She is unable to discern whether the auditory hallucinations occur when she isn't having a migraine. Pt has no history of suicide attempts. Pt denies homicidal thoughts or physical aggression. Pt denies having access to firearms. Pt denies having any legal problems at this time. Pt denies SI currently or at any time in the past. Pt denies any history of suicide attempts and denies history of self-mutilation.  Parents report pt has an appointment with a neurologist in two weeks with [facility]. Mom says they have been trying to get an MRI for patient but have had to wait two months due to Medicaid. They report great concern about pt's physical and mental state. There is a history of suicide on dad's side of family.   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 Anxiety and depression   . Migraines   . Panic attacks     Past Surgical History:  Procedure Laterality Date  . CHOLECYSTECTOMY      Family History: No family history on file.  Social History:  reports that she has never smoked. She has never used smokeless tobacco. She reports that she does not drink alcohol or use drugs.  Additional Social History:  Alcohol / Drug Use Pain Medications: pt denies abuse - see pta meds list Prescriptions: pt denies abuse - see pta meds list Over the Counter: pt denies abuse - see pta meds list History of alcohol / drug use?: No history of alcohol / drug abuse  CIWA: CIWA-Ar BP: 130/81 Pulse Rate: (!) 113 COWS:    Allergies:  Allergies  Allergen Reactions  . Other Itching    Metal     Home Medications:  (Not in a hospital admission)  OB/GYN Status:  No LMP recorded.  General Assessment Data Location of Assessment: [facility] ED TTS Assessment: In system Is this a Tele or Face-to-Face Assessment?: Face-to-Face Is this an Initial Assessment or a Re-assessment for this encounter?: Initial Assessment Marital status: Single Maiden name: Betty Rogers IT consultant Is patient pregnant?: No Pregnancy Status: No Living Arrangements: Parent, Other relatives(mom, dad, 71 yo brother) Can pt return to current living arrangement?: Yes Admission Status: Voluntary Is patient  capable of signing voluntary admission?: Yes Referral Source: Self/Family/Friend Insurance type: medicaid     Crisis Care Plan Living Arrangements: Parent, Other relatives(mom, dad, 38 yo brother) Legal Guardian: (herself) Name of Psychiatrist: none Name of Therapist: none  Education Status Is patient currently in school?: No Is the patient employed,  unemployed or receiving disability?: Unemployed  Risk to self with the past 6 months Suicidal Ideation: Yes-Currently Present Has patient been a risk to self within the past 6 months prior to admission? : No Suicidal Intent: No Has patient had any suicidal intent within the past 6 months prior to admission? : No Is patient at risk for suicide?: Yes Suicidal Plan?: Yes-Currently Present Has patient had any suicidal plan within the past 6 months prior to admission? : Yes Specify Current Suicidal Plan: jump out of car this am on way to hospital Access to Means: Yes Specify Access to Suicidal Means: access to car, knives at home What has been your use of drugs/alcohol within the last 12 months?: none Previous Attempts/Gestures: No How many times?: 0 Other Self Harm Risks: none Triggers for Past Attempts: (n/a) Intentional Self Injurious Behavior: None Family Suicide History: Yes(on dad's side) Recent stressful life event(s): Recent negative physical changes(migraines getting worse) Persecutory voices/beliefs?: Yes Depression: Yes Depression Symptoms: Isolating, Fatigue, Feeling angry/irritable, Loss of interest in usual pleasures Substance abuse history and/or treatment for substance abuse?: No Suicide prevention information given to non-admitted patients: Not applicable  Risk to Others within the past 6 months Homicidal Ideation: No Does patient have any lifetime risk of violence toward others beyond the six months prior to admission? : No Thoughts of Harm to Others: No Current Homicidal Intent: No Current Homicidal Plan: No Access to Homicidal Means: No Identified Victim: none History of harm to others?: No Assessment of Violence: None Noted Violent Behavior Description: pt denies history of violence Does patient have access to weapons?: No Criminal Charges Pending?: No Does patient have a court date: No Is patient on probation?: No  Psychosis Hallucinations: Auditory, With  command Delusions: None noted  Mental Status Report Appearance/Hygiene: Unremarkable, In scrubs(overweight) Eye Contact: Fair Motor Activity: Freedom of movement Speech: Logical/coherent Level of Consciousness: Alert, Quiet/awake Mood: Depressed, Anxious, Sad, Anhedonia Affect: Appropriate to circumstance, Depressed, Anxious, Sad Anxiety Level: Panic Attacks Panic attack frequency: daily Most recent panic attack: earlier this morning Thought Processes: Coherent, Relevant Judgement: Impaired Orientation: Person, Place, Time, Situation Obsessive Compulsive Thoughts/Behaviors: None  Cognitive Functioning Concentration: Decreased(due to AH) Memory: Remote Intact, Recent Intact Is patient IDD: No Is patient DD?: No Insight: Fair Impulse Control: Fair Appetite: Fair Have you had any weight changes? : No Change Sleep: Decreased Total Hours of Sleep: 6 Vegetative Symptoms: None  ADLScreening [facility] Assessment Services) Patient's cognitive ability adequate to safely complete daily activities?: Yes Patient able to express need for assistance with ADLs?: Yes Independently performs ADLs?: Yes (appropriate for developmental age)  Prior Inpatient Therapy Prior Inpatient Therapy: No  Prior Outpatient Therapy Prior Outpatient Therapy: Yes Prior Therapy Dates: in the distant past Prior Therapy Facilty/Provider(s): doesn't remember Reason for Treatment: MDD, panic disorder Does patient have an ACCT team?: No Does patient have Intensive In-House Services?  : No Does patient have [facility] services? : No Does patient have P4CC services?: No  ADL Screening (condition at time of admission) Patient's cognitive ability adequate to safely complete daily activities?: Yes Is the patient deaf or have difficulty hearing?: No Does the patient have difficulty seeing, even when wearing glasses/contacts?: No Does  the patient have difficulty concentrating, remembering, or making decisions?:  Yes Patient able to express need for assistance with ADLs?: Yes Does the patient have difficulty dressing or bathing?: No Independently performs ADLs?: Yes (appropriate for developmental age) Does the patient have difficulty walking or climbing stairs?: No Weakness of Legs: None Weakness of Arms/Hands: None  Home Assistive Devices/Equipment Home Assistive Devices/Equipment: Eyeglasses    Abuse/Neglect Assessment (Assessment to be complete while patient is alone) Abuse/Neglect Assessment Can Be Completed: Yes Physical Abuse: Denies Verbal Abuse: Denies Sexual Abuse: Denies Exploitation of patient/patient's resources: Denies Self-Neglect: Denies     Merchant navy officer (For Healthcare) Does Patient Have a Medical Advance Directive?: No Would patient like information on creating a medical advance directive?: No - Patient declined    Additional Information 1:1 In Past 12 Months?: No CIRT Risk: No Elopement Risk: No Does patient have medical clearance?: Yes     Disposition:  Disposition Initial Assessment Completed for this Encounter: Yes Disposition of Patient: Admit(shuvon rankin NP recommends inpatient)  On Site Evaluation by:   Reviewed with Physician:    Donnamarie Rossetti P 07/18/2017 8:51 AM

## 2017-07-18 NOTE — ED Triage Notes (Signed)
Mother reports patient has migraine headaches but this seems different than usual. Patient reports having ringing in her ears and having auditory and visual hallucinations.  Endorses being SI with no plan.

## 2017-07-18 NOTE — BHH Suicide Risk Assessment (Signed)
[facility] Admission Suicide Risk Assessment   Nursing information obtained from:    Demographic factors:    Current Mental Status:    Loss Factors:    Historical Factors:    Risk Reduction Factors:     Total Time spent with patient: 45 minutes Principal Problem: <principal problem not specified> Diagnosis:   Patient Active Problem List   Diagnosis Date Noted  . Schizoaffective disorder (HCC) [F25.9] 07/18/2017   Subjective Data: Patient is seen and examined.  Patient is a 22 year old female with a past psychiatric history significant for autism spectrum disorder, unspecified depression, unspecified anxiety, unspecified panic attacks, migraine headaches and apparently tinnitus who originally presented to the [facility] emergency department with a chronic migraine headache.  Apparently the patient did have a migraine for the last several weeks to a month.  It had worsened over the last several weeks.  She had actually been hospitalized at [facility]  approximately a week ago.  Apparently that was not successful.  She presented to the emergency room with her mother on 5/18.  The mother wanted her to have an MRI at that time.  The emergency room physician did not believe it was indicated.  During the course of the evaluation the patient stated she was having auditory and visual hallucinations.  She was having ringing in her ears.  The mother stated that she threatened to jump out of the car because of her migraine headache being so bad.  She stated that the auditory and visual hallucinations came because the headache was so bad.  She received IV Depakote in the emergency room.  At the time of the evaluation in the psychiatric unit the patient denied having an active headache, but did state she was having hallucinations.  There is also some suspicion that she may have some intellectual deficits as well as autism.  She was not a very good historian.  I contacted the mother.  In the emergency room notes  that stated that she did not have a psychiatric provider, but the mother stated that she had been seen by a neuropsychiatric practice in [place].  She had last seen them about a month ago.  The patient was having a headache at that time, but per the mother "they did nothing".  She did state she had been referred to a neurologist, but that was approximately a month away.  The mother stated that because of the headache she was having panic attacks multiple times during the day and became agitated easily.  When discussing about the auditory hallucinations the mother stated that the patient has been on multiple medications throughout her past.  Review of her autism spectrum disorder medications revealed at least in 2017 she was taken topiramate, sumatriptan, Seroquel and Prozac.  The patient was admitted to the hospital for evaluation and stabilization.  Continued Clinical Symptoms:  Alcohol Use Disorder Identification Test Final Score (AUDIT): 1 The "Alcohol Use Disorders Identification Test", Guidelines for Use in Primary Care, Second Edition.  World Science writer [facility]). Score between 0-7:  no or low risk or alcohol related problems. Score between 8-15:  moderate risk of alcohol related problems. Score between 16-19:  high risk of alcohol related problems. Score 20 or above:  warrants further diagnostic evaluation for alcohol dependence and treatment.   CLINICAL FACTORS:   Depression:   Anhedonia Hopelessness Impulsivity More than one psychiatric diagnosis Currently Psychotic Previous Psychiatric Diagnoses and Treatments   Musculoskeletal: Strength & Muscle Tone: within normal limits Gait &  Station: normal Patient leans: N/A  Psychiatric Specialty Exam: Physical Exam  Constitutional: She is oriented to person, place, and time. She appears well-developed and well-nourished.  HENT:  Head: Normocephalic and atraumatic.  Respiratory: Effort normal.  Musculoskeletal: Normal range of  motion.  Neurological: She is alert and oriented to person, place, and time.    ROS  There were no vitals taken for this visit.There is no height or weight on file to calculate BMI.  General Appearance: Disheveled  Eye Contact:  Fair  Speech:  Normal Rate  Volume:  Decreased  Mood:  Dysphoric  Affect:  Congruent  Thought Process:  Linear  Orientation:  Full (Time, Place, and Person)  Thought Content:  Logical and Hallucinations: Auditory  Suicidal Thoughts:  Yes.  without intent/plan  Homicidal Thoughts:  No  Memory:  Immediate;   Poor  Judgement:  Impaired  Insight:  Lacking  Psychomotor Activity:  Increased  Concentration:  Concentration: Poor  Recall:  Fair  Fund of Knowledge:  Poor  Language:  Fair  Akathisia:  Negative  Handed:  Right  AIMS (if indicated):     Assets:  Desire for Improvement  ADL's:  Intact  Cognition:  WNL  Sleep:         COGNITIVE FEATURES THAT CONTRIBUTE TO RISK:  None    SUICIDE RISK:   Minimal: No identifiable suicidal ideation.  Patients presenting with no risk factors but with morbid ruminations; may be classified as minimal risk based on the severity of the depressive symptoms  PLAN OF CARE: Patient is seen and examined.  Patient is a 22 year old female with the above-stated past psychiatric history who was admitted to the hospital because of suicidal ideation.  She has been previously treated with Seroquel, Topamax, Prozac.  Her Prozac dosage is currently 80 mg, but apparently not really effective.,  Decrease her dose down to 60 mg a day with the idea of weaning her off over the next several days and starting something else.  The list of medications given by her mother did not include Seroquel, and I am going restart that at 100 mg p.o. nightly.  We will continue the Depakote at 750 mg p.o. nightly, and I am can increase her Ativan 1 mg p.o. 3 times daily as needed.  We will continue the Vistaril.  Compazine and Imitrex will be continued for  migraine headache.  We are going to continue to contact her pharmacy to clarify her medications more clearly.  I certify that inpatient services furnished can reasonably be expected to improve the patient's condition.   Antonieta Pert, MD 07/18/2017, 3:21 PM

## 2017-07-19 ENCOUNTER — Encounter (HOSPITAL_COMMUNITY): Payer: Self-pay | Admitting: Psychiatry

## 2017-07-19 LAB — VALPROIC ACID LEVEL: VALPROIC ACID LVL: 99 ug/mL (ref 50.0–100.0)

## 2017-07-19 MED ORDER — QUETIAPINE FUMARATE 200 MG PO TABS
200.0000 mg | ORAL_TABLET | Freq: Every day | ORAL | Status: DC
  Administered 2017-07-19 – 2017-07-20 (×2): 200 mg via ORAL
  Filled 2017-07-19 (×5): qty 1

## 2017-07-19 MED ORDER — LORAZEPAM 0.5 MG PO TABS
0.5000 mg | ORAL_TABLET | Freq: Four times a day (QID) | ORAL | Status: DC | PRN
  Administered 2017-07-19 – 2017-07-22 (×4): 0.5 mg via ORAL
  Filled 2017-07-19 (×4): qty 1

## 2017-07-19 MED ORDER — FLUOXETINE HCL 20 MG PO CAPS
40.0000 mg | ORAL_CAPSULE | Freq: Every day | ORAL | Status: DC
  Administered 2017-07-20 – 2017-07-21 (×2): 40 mg via ORAL
  Filled 2017-07-19 (×4): qty 2

## 2017-07-19 NOTE — Progress Notes (Signed)
Nutrition Brief Note  Patient identified on the Malnutrition Screening Tool (MST) Report  No weight loss in weigh records. Pt reports poor appetite. Ensure supplements have been ordered per protocol.  Wt Readings from Last 15 Encounters:  07/18/17 165 lb (74.8 kg)  07/18/17 165 lb (74.8 kg)    Body mass index is 28.32 kg/m. Patient meets criteria for overweight based on current BMI.   Labs and medications reviewed.   No nutrition interventions warranted at this time. If nutrition issues arise, please consult RD.   Tilda Franco, MS, RD, LDN [facility] Inpatient Clinical Dietitian Pager: [phone] After Hours Pager: [phone]

## 2017-07-19 NOTE — Progress Notes (Signed)
Writer entered patients room and observed her lying in bed resting. Patients mother inquired about her medications and wanted to know what she would be taking. Writer informed mother of medication she would receive at bedtime. Her mom is concerned of medications she will receive and writer encouraged her to speak with her doctor on tomorrow. Patient received scheduled medication and continued to rest in bed. Safety maintained on unit with 15 min checks.

## 2017-07-19 NOTE — [facility] Group Notes (Signed)
[facility] LCSW Group Therapy Note  07/19/2017  10:00-11:00AM  Type of Therapy and Topic:  Group Therapy:  Building Supports  Participation Level:  Minimal   Description of Group:  Patients in this group were introduced to the idea of adding a variety of healthy supports to address the various needs in their lives.  Different types of support were defined and described, and patients were asked to act out what each type could be.  Patients discussed what additional healthy supports could be helpful in their recovery and wellness after discharge in order to prevent future hospitalizations.   An emphasis was placed on following up with the discharge plan when they leave the hospital in order to continue becoming healthier and happier.  Therapeutic Goals: 1)  demonstrate the importance of adding supports  2)  discuss 4 definitions of support  3)  identify the patient's current level of healthy support and   4)  elicit commitments to add one healthy support   Summary of Patient Progress:  Patient was present and engaged in the beginning of group reporting her mother is a healthy support. Patient left the group prior to completion and did not take the worksheet / information with her nor return to group.   Therapeutic Modalities:   Motivational Interviewing Brief Solution-Focused Therapy  Shellia Cleverly

## 2017-07-19 NOTE — Progress Notes (Signed)
Adult Psychoeducational Group Note  Date:  07/19/2017 Time:  2:07 AM  Group Topic/Focus:  Wrap-Up Group:   The focus of this group is to help patients review their daily goal of treatment and discuss progress on daily workbooks.  Participation Level:  Did Not Attend  Participation Quality:  Did Not Attend  Affect:  Did Not Attend  Cognitive:  Did Not Attend  Insight: None  Engagement in Group:  Did Not Attend  Modes of Intervention:  Did Not Attend  Additional Comments:  Pt did not attend evening wrap up group tonight.  Felipa Furnace 07/19/2017, 2:07 AM

## 2017-07-19 NOTE — Progress Notes (Signed)
D: Patient presents anxious, somatically preoccupied, and depressed. Patient endorsing SI with no plan or intent, "I don't know what I would do." Guardianship paperwork was obtained per report from prior shift. She also endorses questionable hallucinations of "commands that are coming in static." Patient continues to have migraine pain 10/10. Denies HI. Reports anxiety 10/10, depression 9/10, and hopelessness 10/10. Patient endorses having tremors, agitation, and nausea, but no tremors observed. Patient had no emesis. C/O lightheadedness and dizziness. Orthostatics do not reveal hypotension. A: Patient checked q15 min, and checks reviewed. Reviewed medication changes with patient and educated on side effects. Educated patient on importance of attending group therapy sessions and educated on several coping skills. Encouarged participation in milieu through recreation therapy and attending meals with peers. Administered Ibuprofen for pain, patient reports no relief. Administered Ativan and Vistaril for anxiety, which patient states helped a little. Administered compazine for nausea.  R: Patient receptive to education on medications, and is medication compliant. Patient isolates to room or paces in the hallway, minimal interaction with peers. Patient contracts for safety on the unit. Goal for today: "my mood."

## 2017-07-19 NOTE — [facility] Suicide Risk Assessment (Signed)
[facility] INPATIENT:  Family/Significant Other Suicide Prevention Education  Suicide Prevention Education:  Education Completed; Mother, Cadence Minton,  (name of family member/significant other) has been identified by the patient as the family member/significant other with whom the patient will be residing, and identified as the person(s) who will aid the patient in the event of a mental health crisis (suicidal ideations/suicide attempt).  With written consent from the patient, the family member/significant other has been provided the following suicide prevention education, prior to the and/or following the discharge of the patient.  The suicide prevention education provided includes the following:  Suicide risk factors  Suicide prevention and interventions  National Suicide Hotline telephone number  [facility] assessment telephone number  [facility] Emergency Assistance 911  [facility] and/or Residential Mobile Crisis Unit telephone number  Request made of family/significant other to:  Remove weapons (e.g., guns, rifles, knives), all items previously/currently identified as safety concern.    Remove drugs/medications (over-the-counter, prescriptions, illicit drugs), all items previously/currently identified as a safety concern.  The family member/significant other verbalizes understanding of the suicide prevention education information provided.  The family member/significant other agrees to remove the items of safety concern listed above. Mother inquired about how her daughter was doing. CSW reported she saw patient during group today. Mother's response to information was "I am well aware of this dear". CSW explained it is part of the process and completed the information with mom. Mother stated "thank you Erubiel Manasco" and disconnected the call.   Shellia Cleverly 07/19/2017, 4:29 PM

## 2017-07-19 NOTE — Plan of Care (Signed)
Patient reports poor sleep, poor appetite, poor concentration, feeling hyper. Isolates to room, paces hallway. Minimal participation in plan of care.

## 2017-07-19 NOTE — Progress Notes (Signed)
[facility] MD Progress Note  07/19/2017 10:41 AM Betty Rogers  MRN:  [id number] Subjective: Patient is seen and examined.  Patient is a 22 year old female with a reported past psychiatric history significant for autism spectrum disorder, unspecified depression, unspecified anxiety, unspecified panic attacks, unspecified psychosis, migraine headaches and apparently tinnitus.  She is seen in follow-up.  She stated that she felt about the same.  She stated the voices were decreased, but still there.  She stated the voices were "in my head".  She stated she was still having fleeting suicidal ideation.  She continued to complain of a headache.  Her Depakote level this morning was 99.  I spoke to her mother for quite some time this morning.  It really sounds as though most of these problems have been going on for months or years.  She stated that she had been doing well prior to February of this year.  She stated the headache, anxiety, suicidal thoughts and other problems have been going on since February.  The headache had been going on and off for many years.  They previously seen neurology in [facility], but that doctor left, and the person they were referred to the mother and patient did not like them.  She is seeing someone in neuropsychiatry here, and just saw them a couple weeks ago.  She stated that the patient had been on multiple medications, and nothing it really helped her in the past.  When we are discussing the past were talking about years.  We discussed the fact that an acute psychiatric hospitalization would not not really help many of the issues that were going on.  The mother's frustration is really with the inability to get to see a neurologist.  I told her I would call them neurology consult, but was unsure whether or not they could get over to see her.  I discussed with the patient as well as her mother increasing the Seroquel.  Her mother stated she had been on Seroquel before, but "made her sleep  all day".  I told her that since she been on it before and tolerated it that I would increase it to 200 mg which was less than what she had had previously.  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 Principal Problem: <principal problem not specified> Diagnosis:   Patient Active Problem List   Diagnosis Date Noted  . 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 Anxiety and depression   . Migraines   . Panic attacks     Past Surgical History:  Procedure Laterality Date  . CHOLECYSTECTOMY    . CHOLECYSTECTOMY     Family History: History reviewed. No pertinent family history. Family Psychiatric  History: See admission H&P Social History:  Social History   Substance and Sexual Activity  Alcohol Use Yes  . Frequency: Never   Comment: "a half a wine cooler occasionally"     Social History   Substance and Sexual Activity  Drug Use Never    Social History   Socioeconomic History  . Marital status: Single    Spouse name: Not on file  . Number of children: Not on file  . Years of education: Not on file  . Highest education level: Not on file  Occupational  History  . Not on file  Social Needs  . Financial resource strain: Not on file  . Food insecurity:    Worry: Not on file    Inability: Not on file  . Transportation needs:    Medical: Not on file    Non-medical: Not on file  Tobacco Use  . Smoking status: Never Smoker  . Smokeless tobacco: Never Used  Substance and Sexual Activity  . Alcohol use: Yes    Frequency: Never    Comment: "a half a wine cooler occasionally"  . Drug use: Never  . Sexual activity: Not Currently    Birth control/protection: Abstinence, None  Lifestyle  . Physical activity:    Days per week: Not on file     Minutes per session: Not on file  . Stress: Not on file  Relationships  . Social connections:    Talks on phone: Not on file    Gets together: Not on file    Attends religious service: Not on file    Active member of club or organization: Not on file    Attends meetings of clubs or organizations: Not on file    Relationship status: Not on file  Other Topics Concern  . Not on file  Social History Narrative  . Not on file   Additional Social History:                         Sleep: Fair  Appetite:  Fair  Current Medications: Current Facility-Administered Medications  Medication Dose Route Frequency Provider Last Rate Last Dose  . divalproex (DEPAKOTE ER) 24 hr tablet 750 mg  750 mg Oral Q supper Antonieta Pert, MD   750 mg at 07/18/17 1715  . feeding supplement (ENSURE ENLIVE) (ENSURE ENLIVE) liquid 237 mL  237 mL Oral BID BM Antonieta Pert, MD   237 mL at 07/18/17 1450  . [START ON 07/20/2017] FLUoxetine (PROZAC) capsule 40 mg  40 mg Oral Daily Antonieta Pert, MD      . hydrOXYzine (ATARAX/VISTARIL) tablet 25 mg  25 mg Oral Q4H PRN Antonieta Pert, MD      . ibuprofen (ADVIL,MOTRIN) tablet 600 mg  600 mg Oral Q6H PRN Rankin, Shuvon B, NP   600 mg at 07/19/17 0727  . LORazepam (ATIVAN) tablet 1 mg  1 mg Oral Q6H PRN Antonieta Pert, MD   1 mg at 07/19/17 0758  . prochlorperazine (COMPAZINE) tablet 10 mg  10 mg Oral Q6H PRN Antonieta Pert, MD   10 mg at 07/19/17 0724  . QUEtiapine (SEROQUEL) tablet 200 mg  200 mg Oral QHS Antonieta Pert, MD      . SUMAtriptan [facility]) tablet 100 mg  100 mg Oral Q2H PRN Antonieta Pert, MD        Lab Results:  Results for orders placed or performed during the hospital encounter of 07/18/17 (from the past 48 hour(s))  Valproic acid level     Status: None   Collection Time: 07/19/17  6:17 AM  Result Value Ref Range   Valproic Acid Lvl 99 50.0 - 100.0 ug/mL    Comment: Performed at [facility], [address] [place], [place] [address]    Blood Alcohol level:  Lab Results  Component Value Date   ETH <10 07/18/2017    Metabolic Disorder Labs: No results found for: HGBA1C, MPG No results found for: PROLACTIN No results found for:  CHOL, TRIG, HDL, CHOLHDL, VLDL, LDLCALC  Physical Findings: AIMS:  , ,  ,  ,    CIWA:    COWS:     Musculoskeletal: Strength & Muscle Tone: within normal limits Gait & Station: normal Patient leans: N/A  Psychiatric Specialty Exam: Physical Exam  Nursing note and vitals reviewed. Constitutional: She is oriented to person, place, and time. She appears well-developed and well-nourished.  HENT:  Head: Normocephalic and atraumatic.  Respiratory: Effort normal.  Neurological: She is alert and oriented to person, place, and time.    ROS  Blood pressure 92/81, pulse (!) 138, temperature 98.2 F (36.8 C), temperature source Oral, resp. rate 18, height  (1.626 m), weight 74.8 kg (165 lb).Body mass index is 28.32 kg/m.  General Appearance: Disheveled  Eye Contact:  Fair  Speech:  Normal Rate  Volume:  Decreased  Mood:  Dysphoric  Affect:  Blunt  Thought Process:  Coherent  Orientation:  Full (Time, Place, and Person)  Thought Content:  Logical and Hallucinations: Auditory  Suicidal Thoughts:  Yes.  without intent/plan  Homicidal Thoughts:  No  Memory:  Immediate;   Poor  Judgement:  Impaired  Insight:  Lacking  Psychomotor Activity:  Increased  Concentration:  Concentration: Fair  Recall:  [employer] of Knowledge:  Fair  Language:  Fair  Akathisia:  Negative  Handed:  Right  AIMS (if indicated):     Assets:  Desire for Improvement Housing Leisure Time Social Support  ADL's:  Intact  Cognition:  WNL  Sleep:  Number of Hours: 6.75     Treatment Plan Summary: Daily contact with patient to assess and evaluate symptoms and progress in treatment, Medication management and Plan Patient is seen and examined.   Patient is a 23 year old female with the above-stated post past medical and past psychiatric history seen in follow-up.  She is essentially unchanged from yesterday.  I talked to her mother about medication changes.  I meant to decrease the fluoxetine to 40 mg a day with the thought of changing it to a different antidepressant once her dosage is down a little bit further.  Her Depakote level was 99, and that may be related to the infusion she got as well as a 750 mg p.o. every afternoon.  No change in the Depakote dose.  We will continue the hydroxyzine and lorazepam at their current dosages.  I am going to increase her Seroquel dosage to 200 mg p.o. nightly.  I have told the mother that I would at least ask for a neurology consult.  Their outpatient neurology appointment is in 2 weeks to a month.  Otherwise many of the issues that she has ongoing will not be able to be solved during the course of an acute hospitalization but hopefully we can improve some of her situations.  Antonieta Pert, MD 07/19/2017, 10:41 AM

## 2017-07-20 DIAGNOSIS — G4700 Insomnia, unspecified: Secondary | ICD-10-CM

## 2017-07-20 DIAGNOSIS — F329 Major depressive disorder, single episode, unspecified: Secondary | ICD-10-CM

## 2017-07-20 DIAGNOSIS — F419 Anxiety disorder, unspecified: Secondary | ICD-10-CM

## 2017-07-20 DIAGNOSIS — R51 Headache: Secondary | ICD-10-CM

## 2017-07-20 DIAGNOSIS — R440 Auditory hallucinations: Secondary | ICD-10-CM

## 2017-07-20 MED ORDER — QUETIAPINE FUMARATE 100 MG PO TABS
100.0000 mg | ORAL_TABLET | Freq: Once | ORAL | Status: AC
  Administered 2017-07-20: 100 mg via ORAL
  Filled 2017-07-20 (×2): qty 1

## 2017-07-20 NOTE — Progress Notes (Addendum)
[facility] MD Progress Note  07/20/2017 10:13 AM Betty Rogers  MRN:  [id number] Subjective: patient reports that she continues to feel depressed, anxious. Attributes these symptoms in part to chronic headache. Denies medication side effects. Objective : I have discussed case with treatment team and have met with patient. 22 year old female, no children, lives with parents, reports history of depression and anxiety. Chart notes indicate history of Autism Spectrum Disorder . Admitted due to depression, suicidal ideations with thoughts of jumping out of a car, auditory hallucinations . Reports history of headache which she thinks has contributed to symptoms. Today states she is feeling " worse". Continues to report anxiety, auditory hallucinations ( describes as voice telling her " do it"), thoughts of cutting self. Denies medication side effects. Currently not restless or  in any acute distress, cooperative during session. Labs - Valproic Acid Serum Level 99- therapeutic     Principal Problem: Diagnosis:   Patient Active Problem List   Diagnosis Date Noted  . Psychosis ([place]) [F29] 07/18/2017   Total Time spent with patient: 20 minutes  Past Psychiatric History: See admission H&P  Past Medical History:  Past Medical History:  Diagnosis Date  . Anxiety and depression   . Migraines   . Panic attacks     Past Surgical History:  Procedure Laterality Date  . CHOLECYSTECTOMY    . CHOLECYSTECTOMY     Family History: History reviewed. No pertinent family history. Family Psychiatric  History: See admission H&P Social History:  Social History   Substance and Sexual Activity  Alcohol Use Yes  . Frequency: Never   Comment: "a half a wine cooler occasionally"     Social History   Substance and Sexual Activity  Drug Use Never    Social History   Socioeconomic History  . Marital status: Single    Spouse name: Not on file  . Number of children: Not on file  . Years of education:  Not on file  . Highest education level: Not on file  Occupational History  . Not on file  Social Needs  . Financial resource strain: Not on file  . Food insecurity:    Worry: Not on file    Inability: Not on file  . Transportation needs:    Medical: Not on file    Non-medical: Not on file  Tobacco Use  . Smoking status: Never Smoker  . Smokeless tobacco: Never Used  Substance and Sexual Activity  . Alcohol use: Yes    Frequency: Never    Comment: "a half a wine cooler occasionally"  . Drug use: Never  . Sexual activity: Not Currently    Birth control/protection: Abstinence, None  Lifestyle  . Physical activity:    Days per week: Not on file    Minutes per session: Not on file  . Stress: Not on file  Relationships  . Social connections:    Talks on phone: Not on file    Gets together: Not on file    Attends religious service: Not on file    Active member of club or organization: Not on file    Attends meetings of clubs or organizations: Not on file    Relationship status: Not on file  Other Topics Concern  . Not on file  Social History Narrative  . Not on file   Additional Social History:   Sleep: improving   Appetite:  Fair  Current Medications: Current Facility-Administered Medications  Medication Dose Route Frequency Provider Last Rate Last  Dose  . divalproex (DEPAKOTE ER) 24 hr tablet 750 mg  750 mg Oral Q supper Sharma Covert, MD   750 mg at 07/19/17 1709  . feeding supplement (ENSURE ENLIVE) (ENSURE ENLIVE) liquid 237 mL  237 mL Oral BID BM Sharma Covert, MD   237 mL at 07/19/17 1512  . FLUoxetine (PROZAC) capsule 40 mg  40 mg Oral Daily Sharma Covert, MD   40 mg at 07/20/17 0745  . hydrOXYzine (ATARAX/VISTARIL) tablet 25 mg  25 mg Oral Q4H PRN Sharma Covert, MD   25 mg at 07/19/17 2056  . ibuprofen (ADVIL,MOTRIN) tablet 600 mg  600 mg Oral Q6H PRN Rankin, Shuvon B, NP   600 mg at 07/20/17 0745  . LORazepam (ATIVAN) tablet 0.5 mg  0.5 mg  Oral Q6H PRN Sharma Covert, MD   0.5 mg at 07/20/17 0745  . prochlorperazine (COMPAZINE) tablet 10 mg  10 mg Oral Q6H PRN Sharma Covert, MD   10 mg at 07/19/17 2151  . QUEtiapine (SEROQUEL) tablet 200 mg  200 mg Oral QHS Sharma Covert, MD   200 mg at 07/19/17 2100  . SUMAtriptan (IMITREX) tablet 100 mg  100 mg Oral Q2H PRN Sharma Covert, MD   100 mg at 07/19/17 2055    Lab Results:  Results for orders placed or performed during the hospital encounter of 07/18/17 (from the past 48 hour(s))  Valproic acid level     Status: None   Collection Time: 07/19/17  6:17 AM  Result Value Ref Range   Valproic Acid Lvl 99 50.0 - 100.0 ug/mL    Comment: Performed at [facility], [place] [address] [place],  [address]    Blood Alcohol level:  Lab Results  Component Value Date   ETH <10 09/62/8366    Metabolic Disorder Labs: No results found for: HGBA1C, MPG No results found for: PROLACTIN No results found for: CHOL, TRIG, HDL, CHOLHDL, VLDL, LDLCALC  Physical Findings: AIMS: Facial and Oral Movements Muscles of Facial Expression: None, normal Lips and Perioral Area: None, normal Jaw: None, normal Tongue: None, normal,Extremity Movements Upper (arms, wrists, hands, fingers): None, normal Lower (legs, knees, ankles, toes): None, normal, Trunk Movements Neck, shoulders, hips: None, normal, Overall Severity Severity of abnormal movements (highest score from questions above): None, normal Incapacitation due to abnormal movements: None, normal Patient's awareness of abnormal movements (rate only patient's report): No Awareness, Dental Status Current problems with teeth and/or dentures?: No Does patient usually wear dentures?: No  CIWA:  CIWA-Ar Total: 5 COWS:  COWS Total Score: 4  Musculoskeletal: Strength & Muscle Tone: within normal limits Gait & Station: normal Patient leans: N/A  Psychiatric Specialty Exam: Physical Exam  Nursing note and  vitals reviewed. Constitutional: She is oriented to person, place, and time. She appears well-developed and well-nourished.  HENT:  Head: Normocephalic and atraumatic.  Respiratory: Effort normal.  Neurological: She is alert and oriented to person, place, and time.    ROS reports chronic headache, no chest pain, no shortness of breath, no vomiting   Blood pressure 106/75, pulse (!) 125, temperature 98.3 F (36.8 C), temperature source Oral, resp. rate 18, height _0  (1.626 m), weight 74.8 kg (165 lb).Body mass index is 28.32 kg/m.  General Appearance: Fairly Groomed  Eye Contact:  Good  Speech:  Normal Rate  Volume:  Normal  Mood:  Anxious and Depressed  Affect:  blunted   Thought Process:  Linear and Descriptions  of Associations: Intact  Orientation:  Other:  fully alert and attentive   Thought Content:  endorses auditory hallucinations, does not appear internally preoccupied, no delusions expressed   Suicidal Thoughts:  endorses thoughts of cutting .   Homicidal Thoughts:  No  Memory:  recent and remote fair   Judgement:  Fair  Insight:  limited   Psychomotor Activity:  Normal  Concentration:  Concentration: Fair and Attention Span: Fair  Recall:  [employer] of Knowledge:  Fair  Language:  Good  Akathisia:  Negative  Handed:  Right  AIMS (if indicated):     Assets:  Desire for Improvement Housing Leisure Time Social Support  ADL's:  Intact  Cognition:  WNL  Sleep:  Number of Hours: 6.25   Assessment : patient reports depression, anxiety, which she attributes in part to chronic headache. Also describes auditory hallucinations/does not currently present internally preoccupied.  Tolerating medications well thus far ( Prozac, Depakote ER, Seroquel). Valproic Acid Serum level within therapeutic range .  Treatment Plan Summary: Encourage group and milieu participation to work on coping skills and symptom reduction Continue Prozac 40 mgrs QDAY for depression,  anxiety Continue Seroquel 200 mgrs QHS for mood disorder and insomnia Continue Depakote ER 750 mgrs QHS for mood disorder  Continue Ativan 0.5 mgr Q 6 hours PRN for anxiety as needed  On Ibuprofen PRN, Imitrex PRN for headache as needed Treatment team working on disposition planning  Continue one to one observation for safety.  Check TSH, HgbA1C, Lipid Panel.    Jenne Campus, MD 07/20/2017, 10:13 AM   Patient ID: Reed Breech, female   DOB: 05-20-1995, 22 y.o.   MRN: [id number]

## 2017-07-20 NOTE — Progress Notes (Addendum)
D: Patient presents flat, somatically preoccupied, anxious, depressed, suicidal. Patient is hearing command hallucinations telling her to harm herself by cutting. She also sees her arm with lacerations in it when she looks at it [facility]). Patient endorses suicidal ideation with plan to hang sheet or pants over the door and hang herself. Patient continues to have migraine 10/10 and anxiety 10/10. Patient denies HI or any thoughts of hurting others. Voices are not telling her to harm others. Patient reports poor sleep, despite receiving sleep aid. Poor appetite, not eating meals. Did not drink ensure this morning. Complains of mild nausea. Tremors reported, but when assessed, no apparent tremors. Patient shakes her arms only when asked to check for tremors. Patient c/o blurred vision, dizziness, lightheadedness related to migraine. Vitals WNL, with slightly elevated heart rate.   A: Patient checked q15 min, and checks reviewed. Reviewed medication changes with patient and educated on side effects. Educated patient on importance of attending group therapy sessions and educated on several coping skills. Encouarged participation in milieu through recreation therapy and attending meals with peers. Placed patient immediately on 1:1 observation and obtained order when suicidal thoughts were voiced. Administered ibuprofen for migraine and Ativan and Vistaril for anxiety, all with minimal relief. Offered compazine for nausea, and patient declined. R: Patient receptive to education on medications, and is medication compliant. Patient refusing to go to groups, cafeteria with peers or recreation time. Patient voiced coping skill of drawing, but refusing to try that to help her anxiety. "I need someone to watch me, please." Goal for today: "Be positive" and to meet that "someone there."

## 2017-07-20 NOTE — [facility] Group Notes (Signed)
[facility] Group Notes:  (Nursing/MHT/Case Management/Adjunct)  Date:  07/20/2017  Time:  4:15 pm  Type of Therapy:  Psychoeducational Skills  Participation Level:  Active  Participation Quality:  Appropriate  Affect:  Appropriate  Cognitive:  Appropriate  Insight:  Appropriate  Engagement in Group:  Engaged  Modes of Intervention:  Discussion  Summary of Progress/Problems: Patient alert and engaged appropriately in group.   Betty Rogers 07/20/2017, 6:17 PM

## 2017-07-20 NOTE — Progress Notes (Signed)
D: Patient is on 1:1 for suicide precautions. She is scoring high risk on CSSRS. She is having auditory and visual hallucinations telling her to cut herself. She also has a plan to hang herself with a sheet or a pair of pants. A: Initiated and maintained 1:1. Educated patient on 1:1 observation. R: Patient is understanding of need for 1:1 observation.

## 2017-07-20 NOTE — Progress Notes (Signed)
Patient ID: Betty Rogers, female   DOB: [birth year], 22 y.o.   MRN: [id number] Pt is very somatic-has several complains including headache, nausea, dizziness anxiety and severe depression. Pt denied SI/HI, pain or AVH. Pt contracts for safety. Pt observed isolated to her room. Medications offered as prescribed. All patient's questions and concerns addressed. Support, encouragement, and safe environment provided. 15-minute safety checks continue. Pt was med compliant. Safety checks continue.

## 2017-07-20 NOTE — [facility] Group Notes (Signed)
[facility] LCSW Group Therapy Note  Date/Time: 07/20/17, 1315  Type of Therapy and Topic:  Group Therapy:  Overcoming Obstacles  Participation Level:  Did not attend  Description of Group:    In this group patients will be encouraged to explore what they see as obstacles to their own wellness and recovery. They will be guided to discuss their thoughts, feelings, and behaviors related to these obstacles. The group will process together ways to cope with barriers, with attention given to specific choices patients can make. Each patient will be challenged to identify changes they are motivated to make in order to overcome their obstacles. This group will be process-oriented, with patients participating in exploration of their own experiences as well as giving and receiving support and challenge from other group members.  Therapeutic Goals: 1. Patient will identify personal and current obstacles as they relate to admission. 2. Patient will identify barriers that currently interfere with their wellness or overcoming obstacles.  3. Patient will identify feelings, thought process and behaviors related to these barriers. 4. Patient will identify two changes they are willing to make to overcome these obstacles:    Summary of Patient Progress      Therapeutic Modalities:   Cognitive Behavioral Therapy Solution Focused Therapy Motivational Interviewing Relapse Prevention Therapy  Greg Adellyn Capek, LCSW 

## 2017-07-20 NOTE — Tx Team (Signed)
Interdisciplinary Treatment and Diagnostic Plan Update  07/20/2017 Time of Session: 10:20am Betty Rogers MRN: [id number]  Principal Diagnosis: <principal problem not specified>  Secondary Diagnoses: Active Problems:   Psychosis ([place])   Current Medications:  Current Facility-Administered Medications  Medication Dose Route Frequency Provider Last Rate Last Dose  . divalproex (DEPAKOTE ER) 24 hr tablet 750 mg  750 mg Oral Q supper Sharma Covert, MD   750 mg at 07/19/17 1709  . feeding supplement (ENSURE ENLIVE) (ENSURE ENLIVE) liquid 237 mL  237 mL Oral BID BM Sharma Covert, MD   237 mL at 07/19/17 1512  . FLUoxetine (PROZAC) capsule 40 mg  40 mg Oral Daily Sharma Covert, MD   40 mg at 07/20/17 0745  . hydrOXYzine (ATARAX/VISTARIL) tablet 25 mg  25 mg Oral Q4H PRN Sharma Covert, MD   25 mg at 07/19/17 2056  . ibuprofen (ADVIL,MOTRIN) tablet 600 mg  600 mg Oral Q6H PRN Rankin, Shuvon B, NP   600 mg at 07/20/17 0745  . LORazepam (ATIVAN) tablet 0.5 mg  0.5 mg Oral Q6H PRN Sharma Covert, MD   0.5 mg at 07/20/17 0745  . prochlorperazine (COMPAZINE) tablet 10 mg  10 mg Oral Q6H PRN Sharma Covert, MD   10 mg at 07/19/17 2151  . QUEtiapine (SEROQUEL) tablet 200 mg  200 mg Oral QHS Sharma Covert, MD   200 mg at 07/19/17 2100  . SUMAtriptan (IMITREX) tablet 100 mg  100 mg Oral Q2H PRN Sharma Covert, MD   100 mg at 07/19/17 2055   PTA Medications: Medications Prior to Admission  Medication Sig Dispense Refill Last Dose  . divalproex (DEPAKOTE) 500 MG DR tablet Take 500 mg by mouth 2 (two) times daily.   07/17/2017 at Unknown time  . FLUoxetine (PROZAC) 40 MG capsule Take 40 mg by mouth daily.   07/17/2017 at Unknown time  . hydrOXYzine (ATARAX/VISTARIL) 10 MG tablet Take 10 mg by mouth 2 (two) times daily as needed for anxiety.   07/17/2017 at Unknown time  . LORazepam (ATIVAN) 1 MG tablet Take 1 mg by mouth 2 (two) times daily.   07/18/2017 at Unknown time   . prochlorperazine (COMPAZINE) 10 MG tablet Take 10 mg by mouth every 8 (eight) hours as needed for nausea or vomiting.   07/17/2017 at Unknown time  . SUMAtriptan (IMITREX) 100 MG tablet Take 50 mg by mouth every 2 (two) hours as needed for migraine. May repeat in 2 hours if headache persists or recurs.   07/18/2017 at Unknown time    Patient Stressors: Health problems  Patient Strengths: Social worker for treatment/growth Supportive family/friends  Treatment Modalities: Medication Management, Group therapy, Case management,  1 to 1 session with clinician, Psychoeducation, Recreational therapy.   Physician Treatment Plan for Primary Diagnosis: <principal problem not specified> Long Term Goal(s): Improvement in symptoms so as ready for discharge Improvement in symptoms so as ready for discharge   Short Term Goals: Ability to identify changes in lifestyle to reduce recurrence of condition will improve Ability to verbalize feelings will improve Ability to disclose and discuss suicidal ideas Ability to demonstrate self-control will improve Ability to identify and develop effective coping behaviors will improve Ability to maintain clinical measurements within normal limits will improve Compliance with prescribed medications will improve Ability to identify changes in lifestyle to reduce recurrence of condition will improve Ability to verbalize feelings will improve Ability to disclose and discuss suicidal ideas Ability to demonstrate  self-control will improve Ability to identify and develop effective coping behaviors will improve Ability to maintain clinical measurements within normal limits will improve Compliance with prescribed medications will improve  Medication Management: Evaluate patient's response, side effects, and tolerance of medication regimen.  Therapeutic Interventions: 1 to 1 sessions, Unit Group sessions and Medication administration.  Evaluation of  Outcomes: Not Met  Physician Treatment Plan for Secondary Diagnosis: Active Problems:   Psychosis ([place])  Long Term Goal(s): Improvement in symptoms so as ready for discharge Improvement in symptoms so as ready for discharge   Short Term Goals: Ability to identify changes in lifestyle to reduce recurrence of condition will improve Ability to verbalize feelings will improve Ability to disclose and discuss suicidal ideas Ability to demonstrate self-control will improve Ability to identify and develop effective coping behaviors will improve Ability to maintain clinical measurements within normal limits will improve Compliance with prescribed medications will improve Ability to identify changes in lifestyle to reduce recurrence of condition will improve Ability to verbalize feelings will improve Ability to disclose and discuss suicidal ideas Ability to demonstrate self-control will improve Ability to identify and develop effective coping behaviors will improve Ability to maintain clinical measurements within normal limits will improve Compliance with prescribed medications will improve     Medication Management: Evaluate patient's response, side effects, and tolerance of medication regimen.  Therapeutic Interventions: 1 to 1 sessions, Unit Group sessions and Medication administration.  Evaluation of Outcomes: Not Met   RN Treatment Plan for Primary Diagnosis: <principal problem not specified> Long Term Goal(s): Knowledge of disease and therapeutic regimen to maintain health will improve  Short Term Goals: Ability to verbalize frustration and anger appropriately will improve, Ability to demonstrate self-control, Ability to disclose and discuss suicidal ideas, Ability to identify and develop effective coping behaviors will improve and Compliance with prescribed medications will improve  Medication Management: RN will administer medications as ordered by provider, will assess and evaluate  patient's response and provide education to patient for prescribed medication. RN will report any adverse and/or side effects to prescribing provider.  Therapeutic Interventions: 1 on 1 counseling sessions, Psychoeducation, Medication administration, Evaluate responses to treatment, Monitor vital signs and CBGs as ordered, Perform/monitor CIWA, COWS, AIMS and Fall Risk screenings as ordered, Perform wound care treatments as ordered.  Evaluation of Outcomes: Not Met   LCSW Treatment Plan for Primary Diagnosis: <principal problem not specified> Long Term Goal(s): Safe transition to appropriate next level of care at discharge, Engage patient in therapeutic group addressing interpersonal concerns.  Short Term Goals: Engage patient in aftercare planning with referrals and resources, Increase social support, Increase ability to appropriately verbalize feelings, Increase emotional regulation, Facilitate acceptance of mental health diagnosis and concerns, Facilitate patient progression through stages of change regarding substance use diagnoses and concerns, Identify triggers associated with mental health/substance abuse issues and Increase skills for wellness and recovery  Therapeutic Interventions: Assess for all discharge needs, 1 to 1 time with Social worker, Explore available resources and support systems, Assess for adequacy in community support network, Educate family and significant other(s) on suicide prevention, Complete Psychosocial Assessment, Interpersonal group therapy.  Evaluation of Outcomes: Not Met   Progress in Treatment: Attending groups: Yes. Participating in groups: Yes. Taking medication as prescribed: Yes. Toleration medication: Yes. Family/Significant other contact made: Yes, individual(s) contacted:  the patient's mother/legal guardian Patient understands diagnosis: Yes. Discussing patient identified problems/goals with staff: Yes. Medical problems stabilized or resolved:  Yes. Denies suicidal/homicidal ideation: Yes. Issues/concerns per patient self-inventory: No.  Other:   New problem(s) identified: None   New Short Term/Long Term Goal(s):medication stabilization, elimination of SI thoughts, development of comprehensive mental wellness plan.   Patient Goals: " I just want to get the voices out of my head"  Discharge Plan or Barriers: Patient plans to return home with her mother/legal guardian and follow up with Dr. Cox  in [place] for medication management. CSW will continue to assess for an appropriate therapy referral/follow up.   Reason for Continuation of Hospitalization: Anxiety Depression Hallucinations Medication stabilization  Estimated Length of Stay: Wednesday, 07/22/17  Attendees: Patient: Betty Rogers 07/20/2017 8:44 AM  Physician: Dr. Fernando Cobos, MD 07/20/2017 8:44 AM  Nursing: Jonathan, RN 07/20/2017 8:44 AM  RN Care Manager: X 07/20/2017 8:44 AM  Social Worker:  , LCSWA 07/20/2017 8:44 AM  Recreational Therapist: X 07/20/2017 8:44 AM  Other: X 07/20/2017 8:44 AM  Other: X 07/20/2017 8:44 AM  Other:X 07/20/2017 8:44 AM    Scribe for Treatment Team:  E , LCSWA 07/20/2017 8:44 AM 

## 2017-07-20 NOTE — Progress Notes (Signed)
D: Patient continuing to need 1:1 supervision. Suicidal with a plan to hang self. Denies hallucinations at this time. A: 1:1 observation maintained. R: Patient understanding of need for 1:1 observation.

## 2017-07-20 NOTE — Progress Notes (Signed)
CSW left message for pt legal guardian/mother Olegario Messier requesting copy of guardianship paperwork and pt therapist name and number. Garner Nash, MSW, LCSW Clinical Social Worker 07/20/2017 1:02 PM

## 2017-07-20 NOTE — Plan of Care (Signed)
Patient has minimal participation in plan of care. Patient is able to voice AVH and suicidal thoughts to staff, which is positive, however, she cannot agree she will be safe. 1:1 initiated. Patient not eating meals, occasionally drinking prescribed Ensure shakes. Not attending any group sessions, due to social anxiety. Support and encouragement provided.

## 2017-07-20 NOTE — Progress Notes (Signed)
Patient ID: Betty Rogers, female   DOB: April 24, 1995, 22 y.o.   MRN: [id number]  Pt currently presents with a flat affect and guarded behavior. Pt is unable to report a goal for tomorrow. Reports goal for discharge is to "not hear voices anymore." Pt reports ongoing headache. Pt reports intermittent sleep with current medication regimen.   Pt provided with medications per providers orders. Pt's labs and vitals were monitored throughout the night. Pt given a 1:1 about emotional and mental status. Pt supported and encouraged to express concerns and questions. Pt educated on medications. Provider notified of patients request for increased sleep medication, see MAR.   Pt's safety ensured with 15 minute and environmental checks. Pt currently denies SI/HI and visual hallucinations. Endorses ongoing AH of "mumbling." Pt verbally agrees to seek staff if SI/HI or VH occurs, AH worsens and to consult with staff before acting on any harmful thoughts. Pt remains on a 1:1 for safety. Will continue POC.

## 2017-07-20 NOTE — Progress Notes (Addendum)
Patient ID: Betty Rogers, female   DOB: 1995-05-31, 22 y.o.   MRN: [id number]  1:1-   D: Pt currently sitting in the dayroom watching tv. Sitter is currently at patients side. Pt reports ongoing migraine. Affect is flat, mood is appropriate.   A: Emotional support given, pt given opportunity to express concerns. Sitter encouraged to share any pertinent observations. Pt offered as fluids and alternative pain relieving methods. Pt asks for a cold pack, given.   R: Pt remains safe on a 1:1 per MD orders. Pt in no current distress, reports no relief from cold pack. Reports relief of water intake. Will continue to monitor.

## 2017-07-20 NOTE — Progress Notes (Signed)
Pt attended group this evening. 

## 2017-07-21 LAB — HEMOGLOBIN A1C
HEMOGLOBIN A1C: 4.6 % — AB (ref 4.8–5.6)
MEAN PLASMA GLUCOSE: 85.32 mg/dL

## 2017-07-21 LAB — LIPID PANEL
Cholesterol: 253 mg/dL — ABNORMAL HIGH (ref 0–200)
HDL: 29 mg/dL — ABNORMAL LOW (ref >40)
LDL CALC: 162 mg/dL — AB (ref 0–99)
TRIGLYCERIDES: 312 mg/dL — AB (ref <150)
Total CHOL/HDL Ratio: 8.7 RATIO
VLDL: 62 mg/dL — ABNORMAL HIGH (ref 0–40)

## 2017-07-21 LAB — TSH: TSH: 1.494 u[IU]/mL (ref 0.350–4.500)

## 2017-07-21 MED ORDER — BUTORPHANOL TARTRATE 10 MG/ML NA SOLN
1.0000 | NASAL | Status: DC | PRN
  Administered 2017-07-21 (×2): 1 via NASAL
  Filled 2017-07-21 (×2): qty 2.5

## 2017-07-21 MED ORDER — FLUOXETINE HCL 20 MG PO CAPS
20.0000 mg | ORAL_CAPSULE | Freq: Every day | ORAL | Status: DC
  Administered 2017-07-22: 20 mg via ORAL
  Filled 2017-07-21 (×3): qty 1

## 2017-07-21 MED ORDER — QUETIAPINE FUMARATE 50 MG PO TABS
300.0000 mg | ORAL_TABLET | Freq: Every day | ORAL | Status: DC
  Administered 2017-07-21 – 2017-07-22 (×2): 300 mg via ORAL
  Filled 2017-07-21 (×3): qty 1

## 2017-07-21 NOTE — Progress Notes (Signed)
Patient ID: Betty Rogers, female   DOB: 03/21/1995, 22 y.o.   MRN: [id number]  1:1-   D: Pt currently lying in bed with eyes closed. Respirations even and unlabored. Sitter is currently within arms reach of patient.  A: Visual assessment of patient and environmental safety completed. Sitter encouraged to share any pertinent observations. None to note. R: Pt remains safe on a 1:1 per MD orders. Pt in no current distress. Will continue to monitor.

## 2017-07-21 NOTE — Plan of Care (Signed)
Problem: Medication: Goal: Compliance with prescribed medication regimen will improve Outcome: Progressing   Patient is taking medications as prescribed per MD without resistance.

## 2017-07-21 NOTE — [facility] Group Notes (Signed)
LCSW Group Therapy Note 07/21/2017 2:31 PM  Type of Therapy/Topic: Group Therapy: Feelings about Diagnosis  Participation Level: Did Not Attend   Description of Group:  This group will allow patients to explore their thoughts and feelings about diagnoses they have received. Patients will be guided to explore their level of understanding and acceptance of these diagnoses. Facilitator will encourage patients to process their thoughts and feelings about the reactions of others to their diagnosis and will guide patients in identifying ways to discuss their diagnosis with significant others in their lives. This group will be process-oriented, with patients participating in exploration of their own experiences, giving and receiving support, and processing challenge from other group members.  Therapeutic Goals: 1. Patient will demonstrate understanding of diagnosis as evidenced by identifying two or more symptoms of the disorder 2. Patient will be able to express two feelings regarding the diagnosis 3. Patient will demonstrate their ability to communicate their needs through discussion and/or role play  Summary of Patient Progress:  Invited, chose not to attend.     Therapeutic Modalities:  Cognitive Behavioral Therapy Brief Therapy Feelings Identification    Raissa Dam Catalina [place] Clinical Social Worker

## 2017-07-21 NOTE — Progress Notes (Signed)
[facility] MD Progress Note  07/21/2017 11:49 AM Betty Rogers  MRN:  [id number] Subjective: Patient is seen and examined.  Patient is a 22 year old female with a past psychiatric history significant for autism spectrum disorder, depression, psychosis.  She is seen in follow-up.  She stated she is basically the same.  She was able to describe the fact that her suicidal thoughts are still present, and that is mainly because of her headache pain.  She still unable to recall any medications that may have been beneficial for her from a depression or headache standpoint.  Her mood is essentially unchanged.  The auditory hallucinations are unchanged. Principal Problem: <principal problem not specified> Diagnosis:   Patient Active Problem List   Diagnosis Date Noted  . Psychosis (HCC) [F29] 07/18/2017   Total Time spent with patient: 20 minutes  Past Psychiatric History: See admission H&P  Past Medical History:  Past Medical History:  Diagnosis Date  . Anxiety and depression   . Migraines   . Panic attacks     Past Surgical History:  Procedure Laterality Date  . CHOLECYSTECTOMY    . CHOLECYSTECTOMY     Family History: History reviewed. No pertinent family history. Family Psychiatric  History: See admission H&P Social History:  Social History   Substance and Sexual Activity  Alcohol Use Yes  . Frequency: Never   Comment: "a half a wine cooler occasionally"     Social History   Substance and Sexual Activity  Drug Use Never    Social History   Socioeconomic History  . Marital status: Single    Spouse name: Not on file  . Number of children: Not on file  . Years of education: Not on file  . Highest education level: Not on file  Occupational History  . Not on file  Social Needs  . Financial resource strain: Not on file  . Food insecurity:    Worry: Not on file    Inability: Not on file  . Transportation needs:    Medical: Not on file    Non-medical: Not on file  Tobacco Use   . Smoking status: Never Smoker  . Smokeless tobacco: Never Used  Substance and Sexual Activity  . Alcohol use: Yes    Frequency: Never    Comment: "a half a wine cooler occasionally"  . Drug use: Never  . Sexual activity: Not Currently    Birth control/protection: Abstinence, None  Lifestyle  . Physical activity:    Days per week: Not on file    Minutes per session: Not on file  . Stress: Not on file  Relationships  . Social connections:    Talks on phone: Not on file    Gets together: Not on file    Attends religious service: Not on file    Active member of club or organization: Not on file    Attends meetings of clubs or organizations: Not on file    Relationship status: Not on file  Other Topics Concern  . Not on file  Social History Narrative  . Not on file   Additional Social History:                         Sleep: Fair  Appetite:  Good  Current Medications: Current Facility-Administered Medications  Medication Dose Route Frequency Provider Last Rate Last Dose  . butorphanol (STADOL) nasal spray 1 spray  1 spray Nasal Q4H PRN Antonieta Pert, MD      .  divalproex (DEPAKOTE ER) 24 hr tablet 750 mg  750 mg Oral Q supper Antonieta Pert, MD   750 mg at 07/20/17 1714  . feeding supplement (ENSURE ENLIVE) (ENSURE ENLIVE) liquid 237 mL  237 mL Oral BID BM Antonieta Pert, MD   237 mL at 07/21/17 1045  . [START ON 07/22/2017] FLUoxetine (PROZAC) capsule 20 mg  20 mg Oral Daily Antonieta Pert, MD      . hydrOXYzine (ATARAX/VISTARIL) tablet 25 mg  25 mg Oral Q4H PRN Antonieta Pert, MD   25 mg at 07/20/17 2111  . ibuprofen (ADVIL,MOTRIN) tablet 600 mg  600 mg Oral Q6H PRN Rankin, Shuvon B, NP   600 mg at 07/21/17 1112  . LORazepam (ATIVAN) tablet 0.5 mg  0.5 mg Oral Q6H PRN Antonieta Pert, MD   0.5 mg at 07/20/17 1733  . prochlorperazine (COMPAZINE) tablet 10 mg  10 mg Oral Q6H PRN Antonieta Pert, MD   10 mg at 07/20/17 1733  . QUEtiapine  (SEROQUEL) tablet 300 mg  300 mg Oral QHS Antonieta Pert, MD      . SUMAtriptan [facility]) tablet 100 mg  100 mg Oral Q2H PRN Antonieta Pert, MD   100 mg at 07/21/17 1610    Lab Results:  Results for orders placed or performed during the hospital encounter of 07/18/17 (from the past 48 hour(s))  Hemoglobin A1c     Status: Abnormal   Collection Time: 07/21/17  6:22 AM  Result Value Ref Range   Hgb A1c MFr Bld 4.6 (L) 4.8 - 5.6 %    Comment: (NOTE) Pre diabetes:          5.7%-6.4% Diabetes:              >6.4% Glycemic control for   <7.0% adults with diabetes    Mean Plasma Glucose 85.32 mg/dL    Comment: Performed at [facility], [address], [place], [place] [address]  Lipid panel     Status: Abnormal   Collection Time: 07/21/17  6:22 AM  Result Value Ref Range   Cholesterol 253 (H) 0 - 200 mg/dL   Triglycerides 409 (H) <150 mg/dL   HDL 29 (L) >81 mg/dL   Total CHOL/HDL Ratio 8.7 RATIO   VLDL 62 (H) 0 - 40 mg/dL   LDL Cholesterol 191 (H) 0 - 99 mg/dL    Comment:        Total Cholesterol/HDL:CHD Risk Coronary Heart Disease Risk Table                     Men   Women  1/2 Average Risk   3.4   3.3  Average Risk       5.0   4.4  2 X Average Risk   9.6   7.1  3 X Average Risk  23.4   11.0        Use the calculated Patient Ratio above and the CHD Risk Table to determine the patient's CHD Risk.        ATP III CLASSIFICATION (LDL):  <100     mg/dL   Optimal  478-295  mg/dL   Near or Above                    Optimal  130-159  mg/dL   Borderline  621-308  mg/dL   High  >657     mg/dL   Very High Performed at [facility]  [facility], [address], [place], [place] [address]   TSH     Status: None   Collection Time: 07/21/17  6:22 AM  Result Value Ref Range   TSH 1.494 0.350 - 4.500 uIU/mL    Comment: Performed by a 3rd Generation assay with a functional sensitivity of <=0.01 uIU/mL. Performed at [facility], [address],  [place], [place] [address]     Blood Alcohol level:  Lab Results  Component Value Date   ETH <10 07/18/2017    Metabolic Disorder Labs: Lab Results  Component Value Date   HGBA1C 4.6 (L) 07/21/2017   MPG 85.32 07/21/2017   No results found for: PROLACTIN Lab Results  Component Value Date   CHOL 253 (H) 07/21/2017   TRIG 312 (H) 07/21/2017   HDL 29 (L) 07/21/2017   CHOLHDL 8.7 07/21/2017   VLDL 62 (H) 07/21/2017   LDLCALC 162 (H) 07/21/2017    Physical Findings: AIMS: Facial and Oral Movements Muscles of Facial Expression: None, normal Lips and Perioral Area: None, normal Jaw: None, normal Tongue: None, normal,Extremity Movements Upper (arms, wrists, hands, fingers): None, normal Lower (legs, knees, ankles, toes): None, normal, Trunk Movements Neck, shoulders, hips: None, normal, Overall Severity Severity of abnormal movements (highest score from questions above): None, normal Incapacitation due to abnormal movements: None, normal Patient's awareness of abnormal movements (rate only patient's report): No Awareness, Dental Status Current problems with teeth and/or dentures?: No Does patient usually wear dentures?: No  CIWA:  CIWA-Ar Total: 6 COWS:  COWS Total Score: 5  Musculoskeletal: Strength & Muscle Tone: within normal limits Gait & Station: normal Patient leans: N/A  Psychiatric Specialty Exam: Physical Exam  Nursing note and vitals reviewed. Constitutional: She is oriented to person, place, and time. She appears well-developed and well-nourished.  HENT:  Head: Normocephalic and atraumatic.  Respiratory: Effort normal.  Musculoskeletal: Normal range of motion.  Neurological: She is alert and oriented to person, place, and time.    ROS  Blood pressure 108/66, pulse (!) 116, temperature (!) 97.4 F (36.3 C), temperature source Oral, resp. rate 18, height  (1.626 m), weight 74.8 kg (165 lb).Body mass index is 28.32 kg/m.  General Appearance: Casual  Eye  Contact:  Fair  Speech:  Slow  Volume:  Decreased  Mood:  Dysphoric  Affect:  Congruent  Thought Process:  Coherent  Orientation:  Full (Time, Place, and Person)  Thought Content:  Logical  Suicidal Thoughts:  Yes.  without intent/plan  Homicidal Thoughts:  No  Memory:  Immediate;   Fair  Judgement:  Impaired  Insight:  Lacking  Psychomotor Activity:  Decreased  Concentration:  Concentration: Fair  Recall:  [employer] of Knowledge:  Fair  Language:  Fair  Akathisia:  Negative  Handed:  Right  AIMS (if indicated):     Assets:  Desire for Improvement  ADL's:  Intact  Cognition:  WNL  Sleep:  Number of Hours: 6.5     Treatment Plan Summary: Daily contact with patient to assess and evaluate symptoms and progress in treatment, Medication management and Plan Patient is seen and examined.  Patient is a 22 year old female with the above-stated past psychiatric history was seen in follow-up.  I am to continue decreasing the Prozac.  Will decrease it to 20 mg p.o. daily today.  We will probably stop it tomorrow, and I will start a different antidepressant medication at that time.  Additionally, I will increase her Seroquel to 300  mg p.o. nightly.  I have written for neurology consult, and I am going to write it again.  In the meantime I am going to try and give her some Stadol.  The patient clearly stated that her depressive mood, suicidal ideation and other issues are clearly linked to her headache.  We will see if any of these changes are beneficial.  Antonieta Pert, MD 07/21/2017, 11:49 AM

## 2017-07-21 NOTE — Progress Notes (Signed)
Patient ID: Betty Rogers, female   DOB: 08-04-1995, 22 y.o.   MRN: [id number]  1:1-   D: Pt currently lying in bed with eyes closed. Respirations even and unlabored. Sitter is currently within arms reach of patient.  A: Visual assessment of patient and environmental safety completed. Sitter encouraged to share any pertinent observations.  R: Pt remains safe on a 1:1 per MD orders. Pt in no current distress. Will continue to monitor.

## 2017-07-21 NOTE — Progress Notes (Signed)
1:1  D: Patiently currently walking hall with Recruitment consultant. Respirations even and unlabored. Sitter is currently within arms reach of patient.  A: Visual assessment of patient and environmental safety completed. No pertinent observations.  R: Patient remains safe on a 1:1 per MD orders. Patient in no current distress. Will continue to monitor.

## 2017-07-21 NOTE — Progress Notes (Addendum)
1:1  D: Patiently currently in room with Recruitment consultant. Respirations even and unlabored. Sitter is within arms reach of patient.  A: Visual assessment of patient and environmental safety completed. No pertinent observations.  R: Patient remains safe on a 1:1 per MD orders. Patient in no current distress. Will continue to monitor.

## 2017-07-21 NOTE — Progress Notes (Signed)
CSW spoke to pt mother/guardian Olegario Messier regarding possible out of home placement.  Olegario Messier does not want to pursue that currently as she does not think that would be an improvement in the care that pt would receive.  Olegario Messier is mostly concerned that pt have an MRI and is hoping this could take place while pt is at [facility].  Pt has told Olegario Messier that she is sitting around watching TV all day.  CSW explained the groups that take place daily and Olegario Messier said most likely her daughter is choosing not to attend, as she has autism diagnosis.   Garner Nash, MSW, LCSW Clinical Social Worker 07/21/2017 10:16 AM

## 2017-07-21 NOTE — Progress Notes (Signed)
1:1  D: Patiently currently in room with Recruitment consultant. Patient is asleep and not in distress. Respirations even and unlabored. Sitter is within arms reach of patient.  A: Visual assessment of patient and environmental safety completed. No pertinent observations.  R: Patient remains safe on a 1:1 per MD orders. Patient in no current distress. Will continue to monitor.

## 2017-07-21 NOTE — Progress Notes (Signed)
Patient presents with flat and depressed affect. Patient's goal for the day is to "not have any suicidal thoughts." Writer asked patient how she'll accomplish this goal and patient said "by sharing how I'm feeling." Patient reports she did not sleep well last night.  Patient provided with medications per MD orders. Patient still on 1:1 for suicide safety reasons and emotional/mental support.   Patient safety ensured with 15 minute and environmental checks. Patient denies HI/AVH, but said SI "comes and goes." Patient verbally agreed to seek staff if SI worsens. Patient remains on 1:1 for safety. Will continue to monitor.

## 2017-07-22 ENCOUNTER — Inpatient Hospital Stay (HOSPITAL_COMMUNITY): Payer: Medicaid Other

## 2017-07-22 ENCOUNTER — Encounter (HOSPITAL_COMMUNITY): Payer: Self-pay | Admitting: Emergency Medicine

## 2017-07-22 LAB — I-STAT BETA HCG BLOOD, ED (MC, WL, AP ONLY)

## 2017-07-22 MED ORDER — DIVALPROEX SODIUM ER 250 MG PO TB24: 750.0000 mg | ORAL_TABLET | Freq: Every day | ORAL | 0 refills | Status: DC

## 2017-07-22 MED ORDER — ALUM & MAG HYDROXIDE-SIMETH 200-200-20 MG/5ML PO SUSP: 30.0000 mL | Freq: Four times a day (QID) | ORAL | Status: DC | PRN

## 2017-07-22 MED ORDER — LORAZEPAM 2 MG/ML IJ SOLN
1.0000 mg | Freq: Once | INTRAMUSCULAR | Status: AC
  Administered 2017-07-22: 1 mg via INTRAVENOUS
  Filled 2017-07-22: qty 1

## 2017-07-22 MED ORDER — PROCHLORPERAZINE EDISYLATE 10 MG/2ML IJ SOLN
10.0000 mg | Freq: Once | INTRAMUSCULAR | Status: AC
  Administered 2017-07-22: 10 mg via INTRAVENOUS
  Filled 2017-07-22: qty 2

## 2017-07-22 MED ORDER — QUETIAPINE FUMARATE 300 MG PO TABS: 300.0000 mg | ORAL_TABLET | Freq: Every day | ORAL | 0 refills | Status: DC

## 2017-07-22 MED ORDER — HYDROXYZINE HCL 25 MG PO TABS: 25.0000 mg | ORAL_TABLET | ORAL | 0 refills | Status: DC | PRN

## 2017-07-22 MED ORDER — FLUOXETINE HCL 20 MG PO CAPS: 20.0000 mg | ORAL_CAPSULE | Freq: Every day | ORAL | 0 refills | Status: AC

## 2017-07-22 MED ORDER — BUSPIRONE HCL 7.5 MG PO TABS: 7.5000 mg | ORAL_TABLET | Freq: Three times a day (TID) | ORAL | 0 refills | Status: DC

## 2017-07-22 MED ORDER — FLUOXETINE HCL 20 MG PO CAPS
40.0000 mg | ORAL_CAPSULE | Freq: Every day | ORAL | Status: DC
  Administered 2017-07-22: 40 mg via ORAL
  Filled 2017-07-22 (×4): qty 2

## 2017-07-22 MED ORDER — PROCHLORPERAZINE MALEATE 10 MG PO TABS: 10.0000 mg | ORAL_TABLET | Freq: Two times a day (BID) | ORAL | 0 refills | Status: DC | PRN

## 2017-07-22 MED ORDER — SODIUM CHLORIDE 0.9 % IV BOLUS
1000.0000 mL | Freq: Once | INTRAVENOUS | Status: AC
  Administered 2017-07-22: 1000 mL via INTRAVENOUS

## 2017-07-22 MED ORDER — DIVALPROEX SODIUM 250 MG PO DR TAB: 500.0000 mg | DELAYED_RELEASE_TABLET | Freq: Two times a day (BID) | ORAL | Status: DC

## 2017-07-22 MED ORDER — LORAZEPAM 1 MG PO TABS
1.0000 mg | ORAL_TABLET | Freq: Four times a day (QID) | ORAL | Status: DC | PRN
  Administered 2017-07-22: 1 mg via ORAL
  Filled 2017-07-22: qty 1

## 2017-07-22 MED ORDER — SUMATRIPTAN SUCCINATE 100 MG PO TABS: 100.0000 mg | ORAL_TABLET | ORAL | 0 refills | Status: DC | PRN

## 2017-07-22 MED ORDER — ONDANSETRON HCL 4 MG PO TABS
4.0000 mg | ORAL_TABLET | Freq: Three times a day (TID) | ORAL | Status: DC | PRN
  Administered 2017-07-23: 4 mg via ORAL
  Filled 2017-07-22: qty 1

## 2017-07-22 MED ORDER — BUSPIRONE HCL 15 MG PO TABS
7.5000 mg | ORAL_TABLET | Freq: Three times a day (TID) | ORAL | Status: DC
  Administered 2017-07-22 – 2017-07-23 (×3): 7.5 mg via ORAL
  Filled 2017-07-22 (×8): qty 1

## 2017-07-22 MED ORDER — KETOROLAC TROMETHAMINE 30 MG/ML IJ SOLN
15.0000 mg | Freq: Once | INTRAMUSCULAR | Status: AC
  Administered 2017-07-22: 15 mg via INTRAVENOUS
  Filled 2017-07-22: qty 1

## 2017-07-22 MED ORDER — ACETAMINOPHEN 325 MG PO TABS
650.0000 mg | ORAL_TABLET | ORAL | Status: DC | PRN
  Administered 2017-07-23: 650 mg via ORAL
  Filled 2017-07-22: qty 2

## 2017-07-22 NOTE — Progress Notes (Signed)
Discharge note:  Patient discharged to [facility] for neurological workup.  Patient received all personal belongings from unit and locker.  Reviewed AVS/transition record with patient and he indicated understanding.  She continues to endorse "thoughts" when asked if she was suicidal.  Parents were notified of patient's transfer and they will meet her in the ED.  Patient left with non-emergent EMS.

## 2017-07-22 NOTE — [facility] Suicide Risk Assessment (Signed)
[facility] Discharge Suicide Risk Assessment   Principal Problem: <principal problem not specified> Discharge Diagnoses:  Patient Active Problem List   Diagnosis Date Noted  . 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  All other systems reviewed and are negative.   Blood pressure 109/78, pulse 92, temperature (!) 97.4 F (36.3 C), temperature source Oral, resp. rate 16, height  (1.626 m), weight 74.8 kg (165 lb).Body mass index is 28.32 kg/m.  General Appearance: Casual  Eye Contact::  Minimal  Speech:  Normal Rate409  Volume:  Decreased  Mood:  Dysphoric  Affect:  Constricted  Thought Process:  Coherent  Orientation:  Full (Time, Place, and Person)  Thought Content:  Hallucinations: Auditory  Suicidal Thoughts:  Yes.  without intent/plan  Homicidal Thoughts:  No  Memory:  Immediate;   Fair  Judgement:  Intact  Insight:  Lacking  Psychomotor Activity:  Normal  Concentration:  Fair  Recall:  [employer] of Knowledge:Fair  Language: Fair  Akathisia:  Negative  Handed:  Right  AIMS (if indicated):     Assets:  Desire for Improvement  Sleep:  Number of Hours: 5.75  Cognition: WNL  ADL's:  Intact   Mental Status Per Nursing Assessment::   On Admission:     Demographic Factors:  Caucasian  Loss Factors: NA  Historical Factors: Impulsivity  Risk Reduction Factors:   Positive social support  Continued Clinical Symptoms:  Chronic Pain  Cognitive Features That Contribute To Risk:  None    Suicide Risk:  Minimal: No identifiable suicidal ideation.  Patients presenting with no risk factors but with morbid ruminations; may be classified as minimal risk based on the severity of the depressive symptoms  Follow-up Information    [facility]. Go on 07/28/2017.   Why:  Please attend your therapy appt with Loura Halt on Tuesday, 07/28/17, at 10:15am. Contact information: [address] [place], [place] [address] [phone] [phone]       [facility]. Go on 08/04/2017.   Specialty:  Behavioral Health Why:  Please attend your medication appt with Tamela Oddi on Tuesday, 08/04/17, at 10:20am. Contact information: [address] [place] [address] [phone]           Plan Of Care/Follow-up recommendations:  Activity:  ad lib  Addendum-patient is being sent to the emergency room.  The patient's mother is requesting more intensive neurological care.  She brought the patient to the emergency room at our facility for a neurological admission.  The patient was admitted to psychiatry because of suicidality.  The patient is made it clear that without the headache she would not be suicidal.  We have attempted to break her headache with several medications including Stadol.  The mother believes that an MRI is necessary.  Neurological consultation had been requested, but has not been completed.  The patient has a neurological appointment in 2 weeks, but the mother believes that the patient needs to be seen neurologically now.  Should she need to be readmitted to psychiatry would probably be recommended that she seek a higher level of care in an academic institution perhaps the [facility] or [facility] where medical, neurological and psychiatric care might be able to be  coordinated to meet the patient and her family's needs.  Antonieta Pert, MD 07/22/2017, 9:18 AM

## 2017-07-22 NOTE — ED Notes (Signed)
Called pharmacy to deliver meds and ordered dinner tray

## 2017-07-22 NOTE — Progress Notes (Signed)
Pt meets criteria for inpatient per Donell Sievert, PA. Referral information has been sent to the following hospitals: [facility]  [facility]  [facility]  [facility]  [facility]   Disposition CSW will continue to assist with placement needs.   Wells Guiles, LCSW, LCAS Disposition CSW [facility]/TTS [phone]

## 2017-07-22 NOTE — ED Notes (Signed)
ED Provider at bedside. 

## 2017-07-22 NOTE — Progress Notes (Signed)
Patient discharged from [facility] to [facility] for complete neurological assessment.  Patient will not return to [facility].  Per MD, if patient needs mental health services, she will need a higher level or care.

## 2017-07-22 NOTE — ED Notes (Addendum)
Patient transported to MRI  Sitter present along with mother of patient.

## 2017-07-22 NOTE — Discharge Instructions (Signed)
As discussed, your evaluation today has been largely reassuring.  But, it is important that you monitor your condition carefully, and do not hesitate to return to the ED if you develop new, or concerning changes in your condition. ? ?Otherwise, please follow-up with your physician for appropriate ongoing care. ? ?

## 2017-07-22 NOTE — Progress Notes (Signed)
Patient ID: [name], female   DOB: [birth year], 22 y.o.   MRN: [id number] 1:1 Note  Pt observed in the dayroom not interacting. Pt continue to complain of severe anxiety and depression; Pt however denied SI/HI, pain or AVH. Pt contracts for safety. Medications offered as prescribed. All patient's questions and concerns addressed. Support, encouragement, and safe environment provided. 15-minute safety checks continue. Pt was med compliant. Safety checks continue. 1:1 staff is present in room with Pt at this time. 1:1 monitoring continues for Pt's safety. 15-minute safety checks also continues at this time.

## 2017-07-22 NOTE — ED Notes (Signed)
Mother and Father of patient presents to the bedside. Mother of patient with elevated tone is asking "What are you doing and when is she getting an MRI, They said that she was getting one." This nurse introducing herself to family and explains where we are in the process of evaluation. Orders were placed for IV fluids and meds" Mother of pt asking what medications patient had already been given prior to arrival in ER. A summary of medications listed. Mother still very frustrated with elevated tone. Sitter present at bedside. EPD informed.

## 2017-07-22 NOTE — ED Provider Notes (Signed)
[facility] EMERGENCY DEPARTMENT Provider Note   CSN: [id number] Arrival date & time: 07/22/17  1029     History   Chief Complaint Chief Complaint  Patient presents with  . Headache    HPI Betty Rogers is a 22 y.o. female.  HPI Patient seen after being discharged from our behavioral health facility, now with concern for ongoing headache. Patient notes that she is a history of migraines, and has had this headache for about 2 months, but more severe than usual. Headache is diffuse, sore, and not improved with OTC medication or fluids, or IV medication provided by behavioral health colleagues. Headache is on the top of her head, which is slightly different from normal, but is throbbing, typical for headaches.  Patient notes that her suicidal ideation has improved substantially, and she has none of this currently.  Not she does still have hallucinations, unchanged. Patient is eventually joined by her mother, who voices some frustration at the patient's lack of access to neurology care.   Past Medical History:  Diagnosis Date  . Anxiety and depression   . Migraines   . Panic attacks     Patient Active Problem List   Diagnosis Date Noted  . Psychosis (HCC) 07/18/2017    Past Surgical History:  Procedure Laterality Date  . CHOLECYSTECTOMY    . CHOLECYSTECTOMY       OB History   None      Home Medications    Prior to Admission medications   Medication Sig Start Date End Date Taking? Authorizing Provider  busPIRone (BUSPAR) 7.5 MG tablet Take 1 tablet (7.5 mg total) by mouth 3 (three) times daily. 07/22/17   Charm Rings, NP  divalproex (DEPAKOTE ER) 250 MG 24 hr tablet Take 3 tablets (750 mg total) by mouth daily with supper. 07/22/17   Charm Rings, NP  divalproex (DEPAKOTE) 500 MG DR tablet Take 500 mg by mouth 2 (two) times daily.    [provider]  FLUoxetine (PROZAC) 20 MG capsule Take 1 capsule (20 mg total) by mouth daily.  07/23/17   Charm Rings, NP  FLUoxetine (PROZAC) 40 MG capsule Take 40 mg by mouth daily.    [provider]  hydrOXYzine (ATARAX/VISTARIL) 10 MG tablet Take 10 mg by mouth 2 (two) times daily as needed for anxiety.    [provider]  hydrOXYzine (ATARAX/VISTARIL) 25 MG tablet Take 1 tablet (25 mg total) by mouth every 4 (four) hours as needed for anxiety. 07/22/17   Charm Rings, NP  LORazepam (ATIVAN) 1 MG tablet Take 1 mg by mouth 2 (two) times daily.    [provider]  prochlorperazine (COMPAZINE) 10 MG tablet Take 10 mg by mouth every 8 (eight) hours as needed for nausea or vomiting.    [provider]  QUEtiapine (SEROQUEL) 300 MG tablet Take 1 tablet (300 mg total) by mouth at bedtime. 07/22/17   Charm Rings, NP  SUMAtriptan (IMITREX) 100 MG tablet Take 50 mg by mouth every 2 (two) hours as needed for migraine. May repeat in 2 hours if headache persists or recurs.    [provider]  SUMAtriptan (IMITREX) 100 MG tablet Take 1 tablet (100 mg total) by mouth every 2 (two) hours as needed for migraine or headache. May repeat in 2 hours if headache persists or recurs. 07/22/17   Charm Rings, NP    Family History History reviewed. No pertinent family history.  Social History Social History  Tobacco Use  . Smoking status: Never Smoker  . Smokeless tobacco: Never Used  Substance Use Topics  . Alcohol use: Yes    Frequency: Never    Comment: "a half a wine cooler occasionally"  . Drug use: Never     Allergies   Nickel and Other   Review of Systems Review of Systems  Constitutional:       Per HPI, otherwise negative  HENT:       Per HPI, otherwise negative  Respiratory:       Per HPI, otherwise negative  Cardiovascular:       Per HPI, otherwise negative  Gastrointestinal: Negative for vomiting.  Endocrine:       Negative aside from HPI  Genitourinary:       Neg aside from HPI   Musculoskeletal:       Per HPI,  otherwise negative  Skin: Negative.   Neurological: Positive for headaches. Negative for syncope.  Psychiatric/Behavioral: Positive for hallucinations, sleep disturbance and suicidal ideas. The patient is nervous/anxious.      Physical Exam Updated Vital Signs BP 115/75 (BP Location: Left Arm)   Pulse (!) 122   Temp 98.4 F (36.9 C) (Oral)   Resp 18   Ht  (1.626 m)   Wt 74.8 kg (165 lb)   SpO2 100%   BMI 28.32 kg/m   Physical Exam  Constitutional: She is oriented to person, place, and time. She appears well-developed and well-nourished. No distress.  HENT:  Head: Normocephalic and atraumatic.  Eyes: Conjunctivae and EOM are normal.  Cardiovascular: Normal rate and regular rhythm.  Pulmonary/Chest: Effort normal and breath sounds normal. No stridor. No respiratory distress.  Abdominal: She exhibits no distension.  Musculoskeletal: She exhibits no edema.  Neurological: She is alert and oriented to person, place, and time. No cranial nerve deficit.  Skin: Skin is warm and dry.  Psychiatric: Her speech is delayed. She expresses no suicidal plans.  Nursing note and vitals reviewed.    ED Treatments / Results  Labs (all labs ordered are listed, but only abnormal results are displayed) Labs Reviewed  HEMOGLOBIN A1C - Abnormal; Notable for the following components:      Result Value   Hgb A1c MFr Bld 4.6 (*)    All other components within normal limits  LIPID PANEL - Abnormal; Notable for the following components:   Cholesterol 253 (*)    Triglycerides 312 (*)    HDL 29 (*)    VLDL 62 (*)    LDL Cholesterol 162 (*)    All other components within normal limits  VALPROIC ACID LEVEL  TSH     Radiology Mr Brain Wo Contrast  Result Date: 07/22/2017 CLINICAL DATA:  Chronic headache, normal exam EXAM: MRI HEAD WITHOUT CONTRAST TECHNIQUE: Multiplanar, multiecho pulse sequences of the brain and surrounding structures were obtained without intravenous contrast.  COMPARISON:  None. FINDINGS: Brain: No acute infarction, hemorrhage, hydrocephalus, extra-axial collection or mass lesion. Negative for Chiari malformation. Vascular: Normal arterial flow voids Skull and upper cervical spine: Negative Sinuses/Orbits: Negative Other: None IMPRESSION: Negative MRI head. Electronically Signed   By: Marlan Palau M.D.   On: 07/22/2017 14:42    Procedures Procedures (including critical care time)  Medications Ordered in ED Medications  feeding supplement (ENSURE ENLIVE) (ENSURE ENLIVE) liquid 237 mL (237 mLs Oral Given 07/22/17 1001)  ibuprofen (ADVIL,MOTRIN) tablet 600 mg (600 mg Oral Given 07/21/17 1112)  divalproex (DEPAKOTE ER) 24 hr tablet 750 mg (750 mg  Oral Given 07/21/17 1604)  SUMAtriptan (IMITREX) tablet 100 mg (100 mg Oral Given 07/21/17 2124)  hydrOXYzine (ATARAX/VISTARIL) tablet 25 mg (25 mg Oral Given 07/21/17 2327)  prochlorperazine (COMPAZINE) tablet 10 mg (10 mg Oral Given 07/20/17 1733)  LORazepam (ATIVAN) tablet 0.5 mg (0.5 mg Oral Given 07/22/17 0650)  butorphanol (STADOL) nasal spray 1 spray (1 spray Nasal Given 07/21/17 1825)  QUEtiapine (SEROQUEL) tablet 300 mg (300 mg Oral Given 07/21/17 2124)  FLUoxetine (PROZAC) capsule 20 mg (20 mg Oral Given 07/22/17 0746)  busPIRone (BUSPAR) tablet 7.5 mg (7.5 mg Oral Given 07/22/17 1002)  sodium chloride 0.9 % bolus 1,000 mL (1,000 mLs Intravenous New Bag/Given 07/22/17 1155)  QUEtiapine (SEROQUEL) tablet 25 mg (25 mg Oral Given 07/18/17 1555)  QUEtiapine (SEROQUEL) tablet 100 mg (100 mg Oral Given 07/20/17 2316)  prochlorperazine (COMPAZINE) injection 10 mg (10 mg Intravenous Given 07/22/17 1158)  ketorolac (TORADOL) 30 MG/ML injection 15 mg (15 mg Intravenous Given 07/22/17 1159)     Initial Impression / Assessment and Plan / ED Course  I have reviewed the triage vital signs and the nursing notes.  Pertinent labs & imaging results that were available during my care of the patient were reviewed by me and  considered in my medical decision making (see chart for details).    After the initial evaluation I reviewed the patient's chart including Dr. mentation from recent behavioral health visit, including Dr. mentation of ongoing chronic headache, going back at least 2 months, per nurse practitioner evaluation "She is seeing someone in neuropsychiatry here, and just saw them a couple weeks ago.  She stated that the patient had been on multiple medications, and nothing it really helped her in the past.  When we are discussing the past were talking about years.  We discussed the fact that an acute psychiatric hospitalization would not not really help many of the issues that were going on.  The mother's frustration is really with the inability to get to see a neurologist. "   On repeat exam the patient is joined by her mother. Mother voices substantial frustration at absence of MRI study, neurology follow-up, and patient's continued suicidal ideation, and inability to wait until next week for neurology follow-up. Mother states the patient has been told she has a Chiari malformation, has concern about this having changed, contributing to the patient's headache.   Update:, Patient now states that she does have some suicidal ideation, though it is unclear if this is due to ongoing pain or intrinsic. I reviewed the reassuring MRI, reassuring vital signs, reassuring prior labs, but the patient's mother remains adamant that the patient is suicidal, in addition to the headache. In spite of attempt to try new medication for headache control, with anticipated improvement in her suicidal status, the patient will have TTS consult, per mother's request, due to persistent suicidal ideation.  Final Clinical Impressions(s) / ED Diagnoses   Final diagnoses:  Brief psychotic disorder (HCC)  Suicidal ideation Bad headache   Gerhard Munch, MD 07/22/17 [phone]

## 2017-07-22 NOTE — ED Notes (Signed)
Pt still in MRI 

## 2017-07-22 NOTE — ED Notes (Addendum)
Dr. Patria Mane notified of mom requesting for pt to be discharged and he will speak to her.

## 2017-07-22 NOTE — BH Assessment (Signed)
Tele Assessment Note   Patient Name: Betty Rogers MRN: [id number] Referring Physician: Dr. Gerhard Munch, MD Location of Patient: [facility]-EMERGENCY DEPT Location of Provider: Behavioral Health TTS Department  Betty Rogers is a 22 y.o. female who was referred for an assessment at [facility] after completing a full Neurological Assessment at [facility]. She was apparently discharged from [facility] earlier this morning. In a note from Betty Devon RN at [phone], "Per MD [Dr. Deetta Perla, MD], if patient needs mental health services, she will need a higher level [of] care."  Pt completed the assessment with her mother out of the room. Pt was asked about SI, HI, AVH, and NSSIB. Pt was asked pointedly about SI, as this was her main concern. Pt was asked about SI in multiple fashions, including (but not limited to) if she wanted to kill herself, if she would kill herself if she left the hospital, if she had a current plan, etc. Pt denied a plan to kill herself or that she had a current plan. Pt's mother was brought back into the room and pt changed her answers, stating that she might kill herself and stating she was unsure she could remain safe at home. Pt's mother was removed from the room and a nurse brought in and pt was asked the same questions. Pt, for a third time, gave different answers. Pt's mother returned to the room. Pt's mother expressed understanding that pt's answers could not be taken literally. Pt's mother expressed scared feelings about returning to work tomorrow and pt being left at home alone. Reviewed healthy and effective coping skills with pt and pt's ability to utilize healthy and effective coping skills. Pt expressed an understanding but appeared to question her ability to follow through. Pt responded somewhat positively to clinician's positive verbal reinforcement that she can make positive decisions for herself.  Pt shared she sometimes has thoughts about HI but stated  they are like those of SI; she has thoughts about them but does not believe she will act on them. She denied NSSIB and any court involvement. She denies any substance use. Pt shares she never experienced any VA, PA, or SA. Pt states she sometimes sees AH in the form of "shadow people," which she specifies occurs happens when she really doesn't feel well, approximately 2x/week.  Pt is oriented x4. Her remote and recent memory is intact. Pt is overall cooperative throughout the assessment, though she became somewhat argumentative when clinician began reminding her of the statements she had made prior in the form of the answers she had provided, almost as if she changed her mind/decided she didn't like the previous answer she gave. Pt's insight, judgement, and impulse control is impaired at this time.   Diagnosis: F60.3, Borderline personality disorder   Past Medical History:  Past Medical History:  Diagnosis Date  . Anxiety and depression   . Migraines   . Panic attacks     Past Surgical History:  Procedure Laterality Date  . CHOLECYSTECTOMY    . CHOLECYSTECTOMY      Family History: History reviewed. No pertinent family history.  Social History:  reports that she has never smoked. She has never used smokeless tobacco. She reports that she drinks alcohol. She reports that she does not use drugs.  Additional Social History:  Alcohol / Drug Use Pain Medications: Please see MAR Prescriptions: Please see MAR Over the Counter: Please see MAR History of alcohol / drug use?: No history of alcohol / drug  abuse Longest period of sobriety (when/how long): N/A  CIWA: CIWA-Ar BP: 121/85 Pulse Rate: 77 Nausea and Vomiting: mild nausea with no vomiting Tactile Disturbances: none Tremor: no tremor Auditory Disturbances: not present Paroxysmal Sweats: no sweat visible Visual Disturbances: not present Anxiety: three Headache, Fullness in Head: mild Agitation: normal activity Orientation and  Clouding of Sensorium: oriented and can do serial additions CIWA-Ar Total: 6 COWS: Clinical Opiate Withdrawal Scale (COWS) Resting Pulse Rate: Pulse Rate 101-120 Sweating: No report of chills or flushing Restlessness: Able to sit still Pupil Size: Pupils pinned or normal size for room light Bone or Joint Aches: Not present Runny Nose or Tearing: Not present GI Upset: nausea or loose stool Tremor: No tremor Yawning: No yawning Anxiety or Irritability: Patient reports increasing irritability or anxiousness Gooseflesh Skin: Skin is smooth COWS Total Score: 5  Allergies:  Allergies  Allergen Reactions  . Nickel     Rash  . Other Itching    Metal     Home Medications:  (Not in a hospital admission)  OB/GYN Status:  No LMP recorded.  General Assessment Data Assessment unable to be completed: Yes Reason for not completing assessment: RN states she is in the middle of a intubation Location of Assessment: [facility] ED TTS Assessment: In system Is this a Tele or Face-to-Face Assessment?: Tele Assessment Is this an Initial Assessment or a Re-assessment for this encounter?: Initial Assessment Marital status: Single Maiden name: Ingle Is patient pregnant?: No Pregnancy Status: No Living Arrangements: Parent, Other relatives Can pt return to current living arrangement?: Yes Admission Status: Voluntary Is patient capable of signing voluntary admission?: Yes Referral Source: Self/Family/Friend Insurance type: Medicaid     Crisis Care Plan Living Arrangements: Parent, Other relatives Legal Guardian: Mother Name of Psychiatrist: Ezzard Rogers of [facility](They are switching to [facility] in June) Name of Therapist: Loura Rogers of [facility]  Education Status Is patient currently in school?: No Highest grade of school patient has completed: Graduated [employer] Is the patient employed, unemployed or receiving  disability?: Receiving disability income  Risk to self with the past 6 months Suicidal Ideation: Yes-Currently Present Has patient been a risk to self within the past 6 months prior to admission? : Yes Suicidal Intent: No(Pt keeps changing answers; 1st said 'no,' then said 'yes') Has patient had any suicidal intent within the past 6 months prior to admission? : No(Pt keeps changing answers, first said 'no' then said 'yes') Is patient at risk for suicide?: No(Pt keeps changing answers; first said 'no' then said 'yes') Suicidal Plan?: No(Pt had a plan on Saturday, does not currently) Has patient had any suicidal plan within the past 6 months prior to admission? : Yes Specify Current Suicidal Plan: Pt had plan on Saturday to hang self with pants Access to Means: Yes Specify Access to Suicidal Means: Pt has access to her clothing What has been your use of drugs/alcohol within the last 12 months?: Pt denies Previous Attempts/Gestures: No How many times?: 0 Other Self Harm Risks: Pt has autism and continuously changes answers, is difficult to know what her intentions are Triggers for Past Attempts: Unpredictable Intentional Self Injurious Behavior: None Family Suicide History: Yes(2 paternal uncles) Recent stressful life event(s): Other (Comment)(Pt has had a headache for 2 months) Persecutory voices/beliefs?: Yes(Pt hears voices that tell her to kill herself) Depression: Yes Depression Symptoms: Feeling angry/irritable, Tearfulness, Fatigue, Insomnia, Guilt, Feeling worthless/self pity Substance abuse history and/or treatment for substance abuse?:  No Suicide prevention information given to non-admitted patients: Not applicable  Risk to Others within the past 6 months Homicidal Ideation: No Does patient have any lifetime risk of violence toward others beyond the six months prior to admission? : No Thoughts of Harm to Others: No Current Homicidal Intent: No Current Homicidal Plan: No Access  to Homicidal Means: No Identified Victim: N/A History of harm to others?: No Assessment of Violence: On admission Violent Behavior Description: None noted Does patient have access to weapons?: No Criminal Charges Pending?: No Does patient have a court date: No Is patient on probation?: No  Psychosis Hallucinations: Auditory Delusions: None noted  Mental Status Report Appearance/Hygiene: Disheveled Eye Contact: Good Motor Activity: Unremarkable Speech: Slow, Logical/coherent Level of Consciousness: Quiet/awake Mood: Sullen Affect: Sullen Anxiety Level: None Panic attack frequency: None noted Most recent panic attack: None noted Thought Processes: Coherent Judgement: Impaired Orientation: Person, Place, Time, Situation Obsessive Compulsive Thoughts/Behaviors: None  Cognitive Functioning Concentration: Normal Memory: Recent Intact, Remote Intact Is patient IDD: No Is patient DD?: No Insight: Poor Impulse Control: Poor Appetite: Good Have you had any weight changes? : No Change Sleep: No Change Total Hours of Sleep: 7 Vegetative Symptoms: None  ADLScreening [facility] Assessment Services) Patient's cognitive ability adequate to safely complete daily activities?: Yes Patient able to express need for assistance with ADLs?: Yes Independently performs ADLs?: Yes (appropriate for developmental age)  Prior Inpatient Therapy Prior Inpatient Therapy: Yes Prior Therapy Dates: Multiple Prior Therapy Facilty/Provider(s): [facility] Reason for Treatment: SI  Prior Outpatient Therapy Prior Outpatient Therapy: Yes Prior Therapy Dates: Present Prior Therapy Facilty/Provider(s): [facility], [facility] Reason for Treatment: SI Does patient have an ACCT team?: No Does patient have Intensive In-House Services?  : No Does patient have [facility] services? : No Does patient have P4CC services?: No  ADL Screening (condition at time of admission) Patient's cognitive  ability adequate to safely complete daily activities?: Yes Is the patient deaf or have difficulty hearing?: No Does the patient have difficulty seeing, even when wearing glasses/contacts?: No Does the patient have difficulty concentrating, remembering, or making decisions?: No Patient able to express need for assistance with ADLs?: Yes Does the patient have difficulty dressing or bathing?: No Independently performs ADLs?: Yes (appropriate for developmental age) Does the patient have difficulty walking or climbing stairs?: No Weakness of Legs: None Weakness of Arms/Hands: None  Home Assistive Devices/Equipment Home Assistive Devices/Equipment: Eyeglasses  Therapy Consults (therapy consults require a physician order) PT Evaluation Needed: No OT Evalulation Needed: No SLP Evaluation Needed: No Abuse/Neglect Assessment (Assessment to be complete while patient is alone) Abuse/Neglect Assessment Can Be Completed: Yes Physical Abuse: Denies Verbal Abuse: Denies Sexual Abuse: Denies Exploitation of patient/patient's resources: Denies Self-Neglect: Denies Values / Beliefs Cultural Requests During Hospitalization: None Spiritual Requests During Hospitalization: None Consults Spiritual Care Consult Needed: No Social Work Consult Needed: No Merchant navy officer (For Healthcare) Does Patient Have a Medical Advance Directive?: No Does patient want to make changes to medical advance directive?: No - Patient declined Type of Advance Directive: Environmental manager of Healthcare Power of Attorney in Chart?: No - copy requested Would patient like information on creating a medical advance directive?: No - Patient declined Nutrition Screen- [facility] Adult/[facility]/AP Patient's home diet: Regular Has the patient recently lost weight without trying?: Patient is unsure Has the patient been eating poorly because of a decreased appetite?: Yes Malnutrition Screening Tool Score: 3     Disposition:  Donell Sievert PA reviewed pt's  chart and information and determined pt does meet criteria for inpatient hospitalization. Pt's nurse Huntley Dec RN was informed of this decision at 2038. There are currently no beds available for pt at [facility], and it was determined earlier that pt needs a higher level of care, so pt's information will be referred out for other potential hospital placements.  Disposition Initial Assessment Completed for this Encounter: Yes  This service was provided via telemedicine using a 2-way, interactive audio and video technology.  Names of all persons participating in this telemedicine service and their role in this encounter. Name: Betty Rogers Role: Patient  Name: Jamison Oka Role: Patient's Mother    Ralph Dowdy 07/22/2017 8:10 PM

## 2017-07-22 NOTE — Progress Notes (Signed)
Patient discharged from [facility] to [facility]. Parents are meeting patient there. Charge nurse aware and A/C aware. All belongings returned to patient.

## 2017-07-22 NOTE — ED Notes (Signed)
Charge RN spoke with pt's mother that is concerned about pt not receiving her meds and her dinner tray.  Charge RN did witness primary RN send meal request via email earlier.  NS called and ordered tray again once tray did not come.  Charge RN also spoke with ED pharmacist about primary RN requesting meds and has not received.  ED pharmacist is in pharmacy now getting meds.  Dinner tray is on the way.  Also updated that TTS note is not in yet regarding pt's disposition.

## 2017-07-22 NOTE — Progress Notes (Signed)
Patient ID: Betty Rogers, female   DOB: 03-20-1995, 22 y.o.   MRN: [id number] Pt at this time is in bed resting with eyes closed. Pt does not look to be in any distress at this time. 1:1 staff is present in room with Pt at this time. 1:1 monitoring continues for Pt's safety. 15-minute safety checks also continues at this time.

## 2017-07-22 NOTE — ED Triage Notes (Signed)
Pt arrives by [facility] from behavioral health after being released from there but has a headache that she reports has been the same for 1 month. History of migraines. Pt has been there since Friday she states for SI thoughts that have improved and she no longer has a plan to harm herself.

## 2017-07-22 NOTE — ED Notes (Signed)
Pt has returned and ED Provider at bedside.

## 2017-07-22 NOTE — ED Notes (Signed)
Pt's mother speaking with charge RN again about TTS decision.  Updated regarding note that pt does meet inpatient criteria but bed not available at [facility] and they state pt needs a higher level of care.  Pt and mother stating that they want to just go home.  Will notify MD.

## 2017-07-22 NOTE — Discharge Summary (Signed)
Physician Discharge Summary Note  Patient:  Betty Rogers is an 22 y.o., female MRN:  [id number] DOB:  1995/06/07 Patient phone:  [phone] (home)  Patient address:   [address] [place] [address],  Total Time spent with patient: 45 minutes  Date of Admission:  07/18/2017 Date of Discharge: 07/21/2017  Reason for Admission:  psychosis  Principal Problem: Psychosis [facility]) Discharge Diagnoses: Patient Active Problem List   Diagnosis Date Noted  . Psychosis ([facility]) [F29] 07/18/2017    Priority: High    Past Psychiatric History: psychosis  Past Medical History:  Past Medical History:  Diagnosis Date  . Anxiety and depression   . Migraines   . Panic attacks     Past Surgical History:  Procedure Laterality Date  . CHOLECYSTECTOMY    . CHOLECYSTECTOMY     Family History: History reviewed. No pertinent family history. Family Psychiatric  History: none  Social History:  Social History   Substance and Sexual Activity  Alcohol Use Yes  . Frequency: Never   Comment: "a half a wine cooler occasionally"     Social History   Substance and Sexual Activity  Drug Use Never    Social History   Socioeconomic History  . Marital status: Single    Spouse name: Not on file  . Number of children: Not on file  . Years of education: Not on file  . Highest education level: Not on file  Occupational History  . Not on file  Social Needs  . Financial resource strain: Not on file  . Food insecurity:    Worry: Not on file    Inability: Not on file  . Transportation needs:    Medical: Not on file    Non-medical: Not on file  Tobacco Use  . Smoking status: Never Smoker  . Smokeless tobacco: Never Used  Substance and Sexual Activity  . Alcohol use: Yes    Frequency: Never    Comment: "a half a wine cooler occasionally"  . Drug use: Never  . Sexual activity: Not Currently    Birth control/protection: Abstinence, None  Lifestyle  . Physical activity:    Days per week:  Not on file    Minutes per session: Not on file  . Stress: Not on file  Relationships  . Social connections:    Talks on phone: Not on file    Gets together: Not on file    Attends religious service: Not on file    Active member of club or organization: Not on file    Attends meetings of clubs or organizations: Not on file    Relationship status: Not on file  Other Topics Concern  . Not on file  Social History Narrative  . Not on file    Hospital Course:  On admission 07/18/2017:  Patient is seen and examined.  Patient is a 22 year old female with a reported past psychiatric history significant for autism spectrum disorder, unspecified depression, unspecified anxiety, unspecified panic attacks, unspecified psychosis, migraine headaches and apparently tinnitus who originally presented to the [facility] emergency department with a chronic migraine headache.  Apparently the patient has had a migraine for the last several weeks to months.  She apparently had a medical hospitalization at [facility] approximately a week ago for this headache.  Apparently that hospitalization was not successful.  She presented to the emergency department with her mother on 5/18.  He was also for a headache evaluation.  The patient wanted her to have an MRI  at that time.  The emergency room physician did not believe it was indicated.  During the course of the evaluation the patient also related having auditory and visual hallucinations.  There was also suicidal ideation with a threat to jump out of the car because the headache was so bad.  The ER gave the patient IV Depakote in the emergency room.  At the time of this evaluation in the psychiatric unit she denied having an active headache.  She did state that she was having auditory hallucinations.  Patient has autism spectrum disorder, and also appears to have some intellectual deficits.  She is not a very good historian.  The emergency room record showed the patient  apparently did not have a psychiatric provider.  I contacted the patient's mother and got some collateral history.  She is seen by psychiatric practice in [place].  It is a neuropsychiatric practice.  She stated that they had seen the provider approximately a month ago.  The patient was having the headache at that time, but "they did nothing".  The patient's mother was somewhat disorganized about the patient's medications.  We attempted to contact the [facility], but they were already closed.  The mother stated the patient had been taking Seroquel, and "had been on everything in the past".  When the mother delivered the list of medications to the nursing staff Seroquel was not on that record.  Review of chart everywhere showed she had previously been treated with Topamax, Seroquel, Prozac.  Her current Prozac dose was 80 mg p.o. daily.  Apparently that is not been successful.  The mother stated she was having panic attacks multiple times a day.  Review of chart everywhere is showed that the patient had been taking 300 mg of Seroquel at least in 2017.  She was admitted to the hospital for evaluation and stabilization.  Medications:  Started Depakote 750 mg after supper for mood stabilization, Prozac 60 mg daily for depression, Vistaril 25 mg every four hour PRN anxiety, Ativan 1 mg every six hours PRN anxiety, Seroquel 100 mg at bedtime for sleep and mood  07/19/2017:  Patient is seen and examined.  Patient is a 22 year old female with a reported past psychiatric history significant for autism spectrum disorder, unspecified depression, unspecified anxiety, unspecified panic attacks, unspecified psychosis, migraine headaches and apparently tinnitus.  She is seen in follow-up.  She stated that she felt about the same.  She stated the voices were decreased, but still there.  She stated the voices were "in my head".  She stated she was still having fleeting suicidal ideation.  She continued to complain  of a headache.  Her Depakote level this morning was 99.  I spoke to her mother for quite some time this morning.  It really sounds as though most of these problems have been going on for months or years.  She stated that she had been doing well prior to February of this year.  She stated the headache, anxiety, suicidal thoughts and other problems have been going on since February.  The headache had been going on and off for many years.  They previously seen neurology in [facility], but that doctor left, and the person they were referred to the mother and patient did not like them.  She is seeing someone in neuropsychiatry here, and just saw them a couple weeks ago.  She stated that the patient had been on multiple medications, and nothing it really helped her in the past.  When we are discussing the past were talking about years.  We discussed the fact that an acute psychiatric hospitalization would not not really help many of the issues that were going on.  The mother's frustration is really with the inability to get to see a neurologist.  I told her I would call them neurology consult, but was unsure whether or not they could get over to see her.  I discussed with the patient as well as her mother increasing the Seroquel.  Her mother stated she had been on Seroquel before, but "made her sleep all day".  I told her that since she been on it before and tolerated it that I would increase it to 200 mg which was less than what she had had previously.  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  Medications:  Prozac decreased to 40 mg daily for depression and Seroquel 100 mg at bedtime for sleep and mood increased to 200 mg .  07/20/2017:  Subjective: patient reports that she continues to feel depressed, anxious. Attributes these symptoms in part to chronic headache. Denies medication side effects. Objective : I have discussed  case with treatment team and have met with patient. 22 year old female, no children, lives with parents, reports history of depression and anxiety. Chart notes indicate history of Autism Spectrum Disorder . Admitted due to depression, suicidal ideations with thoughts of jumping out of a car, auditory hallucinations . Reports history of headache which she thinks has contributed to symptoms. Today states she is feeling " worse". Continues to report anxiety, auditory hallucinations ( describes as voice telling her " do it"), thoughts of cutting self.  Denies medication side effects. Currently not restless or  in any acute distress, cooperative during session. Labs - Valproic Acid Serum Level 99- therapeutic   Medications:  Ativan 1 mg every six hours PRN anxiety changed to 0.5 mg   07/20/2017:  Subjective: Patient is seen and examined.  Patient is a 22 year old female with a past psychiatric history significant for autism spectrum disorder, depression, psychosis.  She is seen in follow-up.  She stated she is basically the same.  She was able to describe the fact that her suicidal thoughts are still present, and that is mainly because of her headache pain.  She still unable to recall any medications that may have been beneficial for her from a depression or headache standpoint.  Her mood is essentially unchanged.  The auditory hallucinations are unchanged  Medications:  Prozac decreased from 40 mg daily to 20 mg daily for depression and Seroquel 200 mg at bedtime for mood and sleep increased to 300 mg  07/22/2017: Patient has met maximum benefit of hospitalization. Denies suicidal/homicidal ideations and withdrawal symptoms. Some hallucinations remain but minimal.  Discharge instructions provided with explanations along with crisis numbers, follow-up appointment, and Rx. Stable for discharge to [facility] ED for neurology evaluation.  Physical Findings: AIMS: Facial and Oral Movements Muscles of Facial  Expression: None, normal Lips and Perioral Area: None, normal Jaw: None, normal Tongue: None, normal,Extremity Movements Upper (arms, wrists, hands, fingers): None, normal Lower (legs, knees, ankles, toes): None, normal, Trunk Movements Neck, shoulders, hips: None, normal, Overall Severity Severity of abnormal movements (highest score from questions above): None, normal Incapacitation due to abnormal movements: None, normal Patient's awareness of abnormal movements (rate only patient's report): No Awareness, Dental Status Current problems with teeth and/or dentures?: No  Does patient usually wear dentures?: No  CIWA:  CIWA-Ar Total: 6 COWS:  COWS Total Score: 5  Musculoskeletal: Strength & Muscle Tone: within normal limits Gait & Station: normal Patient leans: N/A  Psychiatric Specialty Exam: Review of Systems  Neurological: Positive for headaches.  All other systems reviewed and are negative.   Blood pressure 109/78, pulse 92, temperature (!) 97.4 F (36.3 C), temperature source Oral, resp. rate 16, height 5' 4"  (1.626 m), weight 74.8 kg (165 lb).Body mass index is 28.32 kg/m.  General Appearance: Casual  Eye Contact::  Minimal  Speech:  Normal Rate409  Volume:  Decreased  Mood:  Dysphoric  Affect:  Constricted  Thought Process:  Coherent  Orientation:  Full (Time, Place, and Person)  Thought Content:  Hallucinations: Auditory  Suicidal Thoughts:  Yes.  without intent/plan  Homicidal Thoughts:  No  Memory:  Immediate;   Fair  Judgement:  Intact  Insight:  Lacking  Psychomotor Activity:  Normal  Concentration:  Fair  Recall:  [place] of Knowledge:Fair  Language: Fair  Akathisia:  Negative  Handed:  Right  AIMS (if indicated):     Assets:  Desire for Improvement  Sleep:  Number of Hours: 5.75  Cognition: WNL  ADL's:  Intact    Have you used any form of tobacco in the last 30 days? (Cigarettes, Smokeless Tobacco, Cigars, and/or Pipes): No  Has this patient  used any form of tobacco in the last 30 days? (Cigarettes, Smokeless Tobacco, Cigars, and/or Pipes) Yes, Yes, A prescription for an FDA-approved tobacco cessation medication was offered at discharge and the patient refused  Blood Alcohol level:  Lab Results  Component Value Date   ETH <10 53/66/4403    Metabolic Disorder Labs:  Lab Results  Component Value Date   HGBA1C 4.6 (L) 07/21/2017   MPG 85.32 07/21/2017   No results found for: PROLACTIN Lab Results  Component Value Date   CHOL 253 (H) 07/21/2017   TRIG 312 (H) 07/21/2017   HDL 29 (L) 07/21/2017   CHOLHDL 8.7 07/21/2017   VLDL 62 (H) 07/21/2017   LDLCALC 162 (H) 07/21/2017    See Psychiatric Specialty Exam and Suicide Risk Assessment completed by Attending Physician prior to discharge.  Discharge destination:  Home  Is patient on multiple antipsychotic therapies at discharge:  No   Has Patient had three or more failed trials of antipsychotic monotherapy by history:  No  Recommended Plan for Multiple Antipsychotic Therapies: NA  Discharge Instructions    Diet - low sodium heart healthy   Complete by:  As directed    Discharge instructions   Complete by:  As directed    Follow-up with outpatient provider   Increase activity slowly   Complete by:  As directed      Allergies as of 07/22/2017      Reactions   Nickel    Rash   Other Itching   Metal       Medication List    STOP taking these medications   divalproex 500 MG DR tablet Commonly known as:  DEPAKOTE Replaced by:  divalproex 250 MG 24 hr tablet   LORazepam 1 MG tablet Commonly known as:  ATIVAN     TAKE these medications     Indication  busPIRone 7.5 MG tablet Commonly known as:  BUSPAR Take 1 tablet (7.5 mg total) by mouth 3 (three) times daily.  Indication:  Anxiety Disorder   divalproex 250 MG 24 hr tablet Commonly known as:  DEPAKOTE ER Take 3 tablets (750 mg total) by mouth daily with supper. Replaces:  divalproex 500 MG DR  tablet  Indication:  mood stabilization   FLUoxetine 20 MG capsule Commonly known as:  PROZAC Take 1 capsule (20 mg total) by mouth daily. Start taking on:  07/23/2017 What changed:    medication strength  how much to take  Indication:  Depression   hydrOXYzine 25 MG tablet Commonly known as:  ATARAX/VISTARIL Take 1 tablet (25 mg total) by mouth every 4 (four) hours as needed for anxiety. What changed:    medication strength  how much to take  when to take this  Indication:  Feeling Anxious   prochlorperazine 10 MG tablet Commonly known as:  COMPAZINE Take 10 mg by mouth every 8 (eight) hours as needed for nausea or vomiting.  Indication:  Nausea and Vomiting   QUEtiapine 300 MG tablet Commonly known as:  SEROQUEL Take 1 tablet (300 mg total) by mouth at bedtime.  Indication:  mood disorder   SUMAtriptan 100 MG tablet Commonly known as:  IMITREX Take 1 tablet (100 mg total) by mouth every 2 (two) hours as needed for migraine or headache. May repeat in 2 hours if headache persists or recurs. What changed:    how much to take  reasons to take this  Indication:  Migraine Headache      Follow-up [facility]. Go on 07/28/2017.   Why:  Please attend your therapy appt with Kerry Hough on Tuesday, 07/28/17, at 10:15am. Contact information: [address] [place], [place] [address] [phone] F:([phone] [place]. Go on 08/04/2017.   Specialty:  Behavioral Health Why:  Please attend your medication appt with Eino Farber on Tuesday, 08/04/17, at 10:20am. Contact information: [address] [place] [address] [phone]           Follow-up recommendations:  Activity:  as tolerated Diet:  heart healthy diet  Comments:  Follow-up with above appointment   Signed: Waylan Boga, NP 07/22/2017, 9:24 AM

## 2017-07-22 NOTE — ED Notes (Signed)
Pt denies shortness of breath, chest pain or issues with gait or vision.

## 2017-07-23 DIAGNOSIS — R45851 Suicidal ideations: Secondary | ICD-10-CM

## 2017-07-23 DIAGNOSIS — F1099 Alcohol use, unspecified with unspecified alcohol-induced disorder: Secondary | ICD-10-CM

## 2017-07-23 DIAGNOSIS — F840 Autistic disorder: Secondary | ICD-10-CM

## 2017-07-23 MED ORDER — FLUOXETINE HCL 20 MG PO CAPS
20.0000 mg | ORAL_CAPSULE | Freq: Every day | ORAL | Status: DC
  Administered 2017-07-23: 20 mg via ORAL

## 2017-07-23 MED ORDER — QUETIAPINE FUMARATE 300 MG PO TABS: 300.0000 mg | ORAL_TABLET | Freq: Every day | ORAL | 0 refills | Status: DC

## 2017-07-23 NOTE — ED Notes (Signed)
Mother states she is unaware if she has perscrition for serequel . Dr yelvertion provides same.

## 2017-07-23 NOTE — ED Notes (Signed)
DC instructions again discusse dmore fully with pt and will reinforc e with mother. Pt has no belonginfgs. Mother statews she has taken all belojngings home. Pt home in [employer].

## 2017-07-23 NOTE — ED Notes (Signed)
Pt mom took patient belongings home

## 2017-07-23 NOTE — ED Notes (Signed)
Pt home with mother after performs teach back. Pt stez she understands oinstructions and home stable . Dr. Ranae Palms aware of hr.

## 2017-07-23 NOTE — Consult Note (Signed)
Telepsych Consultation   Reason for Consult:  Reported SI Referring Physician:  Dr. Jeraldine Loots Location of Patient: [facility]-EM[facility]GENCY DEPT Location of Provider: Behavioral Health TTS Department  Patient Identification: Betty Rogers MRN:  [id number] Principal Diagnosis: Psychosis [facility]) Diagnosis:   Patient Active Problem List   Diagnosis Date Noted  . Psychosis (HCC) [F29] 07/18/2017    Total Time spent with patient: 30 minutes  Subjective:  Betty Rogers is a 22 y.o. female patient presented to the ED with complaint of ongoing headache and reporting SI.  HPI:   07/22/17 [facility] Counselor Assessment: 22 y.o. female who was referred for an assessment at [facility] after completing a full Neurological Assessment at [facility]. She was apparently discharged from [facility] earlier this morning. In a note from Joslyn Devon RN at [phone], "Per MD [Dr. Deetta Perla, MD], if patient needs mental health services, she will need a higher level [of] care." Pt completed the assessment with her mother out of the room. Pt was asked about SI, HI, AVH, and NSSIB. Pt was asked pointedly about SI, as this was her main concern. Pt was asked about SI in multiple fashions, including (but not limited to) if she wanted to kill herself, if she would kill herself if she left the hospital, if she had a current plan, etc. Pt denied a plan to kill herself or that she had a current plan. Pt's mother was brought back into the room and pt changed her answers, stating that she might kill herself and stating she was unsure she could remain safe at home. Pt's mother was removed from the room and a nurse brought in and pt was asked the same questions. Pt, for a third time, gave different answers. Pt's mother returned to the room. Pt's mother expressed understanding that pt's answers could not be taken literally. Pt's mother expressed scared feelings about returning to work tomorrow and pt being left at home alone. Reviewed  healthy and effective coping skills with pt and pt's ability to utilize healthy and effective coping skills. Pt expressed an understanding but appeared to question her ability to follow through. Pt responded somewhat positively to clinician's positive verbal reinforcement that she can make positive decisions for herself. Pt shared she sometimes has thoughts about HI but stated they are like those of SI; she has thoughts about them but does not believe she will act on them. She denied NSSIB and any court involvement. She denies any substance use. Pt shares she never experienced any VA, PA, or SA. Pt states she sometimes sees AH in the form of "shadow people," which she specifies occurs happens when she really doesn't feel well, approximately 2x/week. Pt is oriented x4. Her remote and recent memory is intact. Pt is overall cooperative throughout the assessment, though she became somewhat argumentative when clinician began reminding her of the statements she had made prior in the form of the answers she had provided, almost as if she changed her mind/decided she didn't like the previous answer she gave. Pt's insight, judgement, and impulse control is impaired at this time.  On evaluation today: Patient is seen by me via tele-psych and I have consulted with Dr. Jola Babinski.  This morning patient denies any SI/HI/AVH and contracts for safety.  She reports that her mother is her legal guardian and we have received paperwork stating that patient mother is her legal guardian.  Patient reports that she feels that she can follow up with her outpatient providers and be safe  at home and is okay with discharging with her parents.  CSW contacted parents and mom states that they are on their way to the hospital now to pick the patient up and they feel safe taking patient home.  Patient did state that her suicidal thoughts come and go and that it usually is associated with her headache.  Otherwise she never has any suicidal thoughts.   Patient was asked why she changed her story with the counselor yesterday when her mom entered the room and patient states that she misunderstood the question from the counselor yesterday.  However, counselor documented extremely well how patient changed her story only when mother was in the room.  Patient seems to be very manipulative when it comes to her family and patient changes her stories to accommodate what she feels she needs.  Legal guardian is encouraged to maintain safety for patient.  Patient does not meet criteria for inpatient treatment and is psychiatrically cleared.  Past Psychiatric History: Patient denies any previous psychiatric history  Risk to Self: Suicidal Ideation: Yes-Currently Present Suicidal Intent: No(Pt keeps changing answers; 1st said 'no,' then said 'yes') Is patient at risk for suicide?: No(Pt keeps changing answers; first said 'no' then said 'yes') Suicidal Plan?: No(Pt had a plan on Saturday, does not currently) Specify Current Suicidal Plan: Pt had plan on Saturday to hang self with pants Access to Means: Yes Specify Access to Suicidal Means: Pt has access to her clothing What has been your use of drugs/alcohol within the last 12 months?: Pt denies How many times?: 0 Other Self Harm Risks: Pt has autism and continuously changes answers, is difficult to know what her intentions are Triggers for Past Attempts: Unpredictable Intentional Self Injurious Behavior: None Risk to Others: Homicidal Ideation: No Thoughts of Harm to Others: No Current Homicidal Intent: No Current Homicidal Plan: No Access to Homicidal Means: No Identified Victim: N/A History of harm to others?: No Assessment of Violence: On admission Violent Behavior Description: None noted Does patient have access to weapons?: No Criminal Charges Pending?: No Does patient have a court date: No Prior Inpatient Therapy: Prior Inpatient Therapy: Yes Prior Therapy Dates: Multiple Prior Therapy  Facilty/Provider(s): [facility] Reason for Treatment: SI Prior Outpatient Therapy: Prior Outpatient Therapy: Yes Prior Therapy Dates: Present Prior Therapy Facilty/Provider(s): [facility], [facility] Reason for Treatment: SI Does patient have an ACCT team?: No Does patient have Intensive In-House Services?  : No Does patient have [facility] services? : No Does patient have P4CC services?: No  Past Medical History:  Past Medical History:  Diagnosis Date  . Anxiety and depression   . Migraines   . Panic attacks     Past Surgical History:  Procedure Laterality Date  . CHOLECYSTECTOMY    . CHOLECYSTECTOMY     Family History: History reviewed. No pertinent family history. Family Psychiatric  History: denies Social History:  Social History   Substance and Sexual Activity  Alcohol Use Yes  . Frequency: Never   Comment: "a half a wine cooler occasionally"     Social History   Substance and Sexual Activity  Drug Use Never    Social History   Socioeconomic History  . Marital status: Single    Spouse name: Not on file  . Number of children: Not on file  . Years of education: Not on file  . Highest education level: Not on file  Occupational History  . Not on file  Social Needs  . Financial resource strain:  Not on file  . Food insecurity:    Worry: Not on file    Inability: Not on file  . Transportation needs:    Medical: Not on file    Non-medical: Not on file  Tobacco Use  . Smoking status: Never Smoker  . Smokeless tobacco: Never Used  Substance and Sexual Activity  . Alcohol use: Yes    Frequency: Never    Comment: "a half a wine cooler occasionally"  . Drug use: Never  . Sexual activity: Not Currently    Birth control/protection: Abstinence, None  Lifestyle  . Physical activity:    Days per week: Not on file    Minutes per session: Not on file  . Stress: Not on file  Relationships  . Social connections:    Talks on phone: Not on file     Gets together: Not on file    Attends religious service: Not on file    Active member of club or organization: Not on file    Attends meetings of clubs or organizations: Not on file    Relationship status: Not on file  Other Topics Concern  . Not on file  Social History Narrative  . Not on file   Additional Social History:    Allergies:   Allergies  Allergen Reactions  . Nickel     Rash  . Other Itching    Metal     Labs:  Results for orders placed or performed during the hospital encounter of 07/18/17 (from the past 48 hour(s))  I-Stat beta hCG blood, ED     Status: None   Collection Time: 07/22/17  4:57 PM  Result Value Ref Range   I-stat hCG, quantitative <5.0 <5 mIU/mL   Comment 3            Comment:   GEST. AGE      CONC.  (mIU/mL)   <=1 WEEK        5 - 50     2 WEEKS       50 - 500     3 WEEKS       100 - 10,000     4 WEEKS     1,000 - 30,000        FEMALE AND NON-PREGNANT FEMALE:     LESS THAN 5 mIU/mL     Medications:  Current Facility-Administered Medications  Medication Dose Route Frequency Provider Last Rate Last Dose  . acetaminophen (TYLENOL) tablet 650 mg  650 mg Oral Q4H PRN Gerhard Munch, MD   650 mg at 07/23/17 1043  . alum & mag hydroxide-simeth (MAALOX/MYLANTA) 200-200-20 MG/5ML suspension 30 mL  30 mL Oral Q6H PRN Gerhard Munch, MD      . busPIRone (BUSPAR) tablet 7.5 mg  7.5 mg Oral TID Antonieta Pert, MD   7.5 mg at 07/22/17 2046  . butorphanol (STADOL) nasal spray 1 spray  1 spray Nasal Q4H PRN Antonieta Pert, MD   1 spray at 07/21/17 1825  . divalproex (DEPAKOTE ER) 24 hr tablet 750 mg  750 mg Oral Q supper Antonieta Pert, MD   750 mg at 07/22/17 2046  . feeding supplement (ENSURE ENLIVE) (ENSURE ENLIVE) liquid 237 mL  237 mL Oral BID BM Antonieta Pert, MD   237 mL at 07/22/17 2105  . FLUoxetine (PROZAC) capsule 20 mg  20 mg Oral Daily Rumbarger, Faye Ramsay, RPH   20 mg at 07/23/17 1044  . hydrOXYzine (ATARAX/VISTARIL) tablet  25 mg  25 mg Oral Q4H PRN Antonieta Pert, MD   25 mg at 07/21/17 2327  . ibuprofen (ADVIL,MOTRIN) tablet 600 mg  600 mg Oral Q6H PRN Rankin, Shuvon B, NP   600 mg at 07/21/17 1112  . LORazepam (ATIVAN) tablet 0.5 mg  0.5 mg Oral Q6H PRN Antonieta Pert, MD   0.5 mg at 07/22/17 0650  . LORazepam (ATIVAN) tablet 1 mg  1 mg Oral Q6H PRN Gerhard Munch, MD   1 mg at 07/22/17 1948  . ondansetron (ZOFRAN) tablet 4 mg  4 mg Oral Q8H PRN Gerhard Munch, MD      . prochlorperazine (COMPAZINE) tablet 10 mg  10 mg Oral Q6H PRN Antonieta Pert, MD   10 mg at 07/20/17 1733  . QUEtiapine (SEROQUEL) tablet 300 mg  300 mg Oral QHS Antonieta Pert, MD   300 mg at 07/22/17 2259  . SUMAtriptan (IMITREX) tablet 100 mg  100 mg Oral Q2H PRN Antonieta Pert, MD   100 mg at 07/21/17 2124   Current Outpatient Medications  Medication Sig Dispense Refill  . busPIRone (BUSPAR) 7.5 MG tablet Take 1 tablet (7.5 mg total) by mouth 3 (three) times daily. 30 tablet 0  . divalproex (DEPAKOTE ER) 250 MG 24 hr tablet Take 3 tablets (750 mg total) by mouth daily with supper. 90 tablet 0  . divalproex (DEPAKOTE) 500 MG DR tablet Take 500 mg by mouth 2 (two) times daily.    Marland Kitchen FLUoxetine (PROZAC) 20 MG capsule Take 1 capsule (20 mg total) by mouth daily. 30 capsule 0  . FLUoxetine (PROZAC) 40 MG capsule Take 40 mg by mouth daily.    . hydrOXYzine (ATARAX/VISTARIL) 10 MG tablet Take 10 mg by mouth 2 (two) times daily as needed for anxiety.    . hydrOXYzine (ATARAX/VISTARIL) 25 MG tablet Take 1 tablet (25 mg total) by mouth every 4 (four) hours as needed for anxiety. 30 tablet 0  . LORazepam (ATIVAN) 1 MG tablet Take 1 mg by mouth 2 (two) times daily.    . prochlorperazine (COMPAZINE) 10 MG tablet Take 10 mg by mouth every 8 (eight) hours as needed for nausea or vomiting.    . prochlorperazine (COMPAZINE) 10 MG tablet Take 1 tablet (10 mg total) by mouth 2 (two) times daily as needed (headache). 10 tablet 0  .  QUEtiapine (SEROQUEL) 300 MG tablet Take 1 tablet (300 mg total) by mouth at bedtime. 30 tablet 0  . SUMAtriptan (IMITREX) 100 MG tablet Take 50 mg by mouth every 2 (two) hours as needed for migraine. May repeat in 2 hours if headache persists or recurs.    . SUMAtriptan (IMITREX) 100 MG tablet Take 1 tablet (100 mg total) by mouth every 2 (two) hours as needed for migraine or headache. May repeat in 2 hours if headache persists or recurs. 10 tablet 0    Musculoskeletal: Strength & Muscle Tone: within normal limits Gait & Station: normal Patient leans: N/A  Psychiatric Specialty Exam: Physical Exam  Nursing note and vitals reviewed. Constitutional: She is oriented to person, place, and time. She appears well-developed and well-nourished.  Respiratory: Effort normal.  Musculoskeletal: Normal range of motion.  Neurological: She is alert and oriented to person, place, and time.  Skin: Skin is warm.    Review of Systems  Constitutional: Negative.   HENT: Negative.   Eyes: Negative.   Respiratory: Negative.   Cardiovascular: Negative.   Gastrointestinal: Negative.   Genitourinary: Negative.  Musculoskeletal: Negative.   Skin: Negative.   Neurological: Positive for headaches.  Endo/Heme/Allergies: Negative.   Psychiatric/Behavioral: Negative.        Denies all at this time    Blood pressure (P) 121/87, pulse (!) (P) 121, temperature (P) 98.1 F (36.7 C), temperature source (P) Axillary, resp. rate (P) 17, height  (1.626 m), weight 74.8 kg (165 lb), SpO2 (P) 98 %.Body mass index is 28.32 kg/m.  General Appearance: Casual  Eye Contact:  Good  Speech:  Clear and Coherent and Normal Rate  Volume:  Normal  Mood:  Depressed  Affect:  Flat  Thought Process:  Linear and Descriptions of Associations: Intact  Orientation:  Full (Time, Place, and Person)  Thought Content:  WDL  Suicidal Thoughts:  No  Homicidal Thoughts:  No  Memory:  Immediate;   Good Recent;   Good Remote;    Good  Judgement:  Impaired  Insight:  Lacking  Psychomotor Activity:  Normal  Concentration:  Concentration: Good and Attention Span: Good  Recall:  Good  Fund of Knowledge:  Good  Language:  Good  Akathisia:  No  Handed:  Right  AIMS (if indicated):     Assets:  Communication Skills Desire for Improvement Financial Resources/Insurance Resilience Social Support  ADL's:  Intact  Cognition:  WNL  Sleep:  Number of Hours: 5.75     Treatment Plan Summary: Discharged home with legal guardian which is also her mother Follow up with outpatient providers Encourage compliance with medications and treatment  Disposition: No evidence of imminent risk to self or others at present.   Patient does not meet criteria for psychiatric inpatient admission. Discussed crisis plan, support from social network, calling 911, coming to the Emergency Department, and calling Suicide Hotline.  This service was provided via telemedicine using a 2-way, interactive audio and video technology.  Names of all persons participating in this telemedicine service and their role in this encounter. Name: Betty Rogers Role: Patient  Name: Feliz Beam Skie Vitrano Role: NP  Name:  Role:   Name:  Role:     Maryfrances Bunnell, FNP 07/23/2017 11:07 AM

## 2017-07-23 NOTE — ED Notes (Signed)
TTS in process 

## 2017-07-23 NOTE — ED Notes (Signed)
Pt and mother verbaqlizes understanding and no further questions but unable tyo sign due to computer.

## 2017-07-23 NOTE — ED Notes (Signed)
Patient was given a snack and drink. 

## 2017-07-23 NOTE — ED Notes (Signed)
Regular Diet was ordered for Lunch. 

## 2017-07-23 NOTE — ED Notes (Signed)
Pt changed into purple scrubs. Pt wanded by security.

## 2017-07-28 ENCOUNTER — Telehealth: Payer: Self-pay | Admitting: Neurology

## 2017-07-28 ENCOUNTER — Encounter: Payer: Self-pay | Admitting: *Deleted

## 2017-07-28 ENCOUNTER — Encounter: Payer: Self-pay | Admitting: Neurology

## 2017-07-28 ENCOUNTER — Ambulatory Visit: Payer: Medicaid Other | Admitting: Neurology

## 2017-07-28 VITALS — BP 118/90 | HR 128 | Ht 64.0 in | Wt 181.0 lb

## 2017-07-28 DIAGNOSIS — M7918 Myalgia, other site: Secondary | ICD-10-CM | POA: Diagnosis not present

## 2017-07-28 DIAGNOSIS — G43711 Chronic migraine without aura, intractable, with status migrainosus: Secondary | ICD-10-CM | POA: Diagnosis not present

## 2017-07-28 DIAGNOSIS — G43709 Chronic migraine without aura, not intractable, without status migrainosus: Secondary | ICD-10-CM | POA: Insufficient documentation

## 2017-07-28 DIAGNOSIS — G8929 Other chronic pain: Secondary | ICD-10-CM | POA: Diagnosis not present

## 2017-07-28 DIAGNOSIS — R29898 Other symptoms and signs involving the musculoskeletal system: Secondary | ICD-10-CM

## 2017-07-28 DIAGNOSIS — M542 Cervicalgia: Secondary | ICD-10-CM | POA: Diagnosis not present

## 2017-07-28 DIAGNOSIS — G43011 Migraine without aura, intractable, with status migrainosus: Secondary | ICD-10-CM | POA: Insufficient documentation

## 2017-07-28 NOTE — Patient Instructions (Signed)
Botox for migraines Lumbar puncture for IIH Physical therapy for the neck and dry needling Continue Aimovig  Lumbar Puncture A lumbar puncture, or spinal tap, is a procedure in which a small amount of the fluid that surrounds the brain and spinal cord is removed and examined. The fluid is called the cerebrospinal fluid. This procedure may be done to:  Help diagnose various problems, such as meningitis, encephalitis, multiple sclerosis, and AIDS.  Remove fluid and relieve pressure that occurs with certain types of headaches.  Look for bleeding within the brain and spinal cord areas (central nervous system).  Place medicine into the spinal fluid.  Tell a health care provider about:  Any allergies you have.  All medicines you are taking, including vitamins, herbs, eye drops, creams, and over-the-counter medicines.  Any problems you or family members have had with anesthetic medicines.  Any blood disorders you have.  Any surgeries you have had.  Any medical conditions you have. What are the risks? Generally, this is a safe procedure. However, as with any procedure, complications can occur. Possible complications include:  Spinal headache. This is a severe headache that occurs when there is a leak of spinal fluid. A spinal headache causes discomfort but is not dangerous. If it persists, another procedure may be done to treat the headache.  Bleeding. This most often occurs in people with bleeding disorders. These are disorders in which the blood does not clot normally.  Infection at the insertion site that can spread to the bone or spinal fluid.  Formation of a spinal cord tumor (rare).  Brain herniation or movement of the brain into the spinal cord (rare).  Inability to move (extremely rare).  What happens before the procedure?  You may have blood tests done. These tests can help tell how well your kidneys and liver are working. They can also show how well your blood  clots.  If you take blood thinners (anticoagulant medicine), ask your health care provider if and when you should stop taking them.  Your health care provider may order a CT scan of your brain.  Make arrangements for someone to drive you home after the procedure. What happens during the procedure?  You will be positioned so that the spaces between the bones of the spine (vertebrae) are as wide as possible. This will make it easier to pass the needle into the spinal canal.  Depending on your age and size, you may lie on your side, curled up with your knees under your chin. Or, you may sit with your head resting on a pillow that is placed at waist level.  The skin covering the lower back (or lumbar region) will be cleaned.  The skin may be numbed with medicine.  You may be given pain medicine or a medicine to help you relax (sedative).  A small needle will be inserted in the skin until it enters the space that contains the spinal fluid. The needle will not enter the spinal cord.  The spinal fluid will be collected into tubes.  The needle will be withdrawn, and a bandage will be placed on the site. What happens after the procedure?  You will remain lying down for 1 hour or for as long as your health care provider suggests.  The spinal fluid will be sent to a laboratory to be examined. The results of the examination may be available before you go home.  A test, called a culture, may be taken of the spinal fluid if your  health care provider thinks you have an infection. If cultures were taken for exam, the results will usually be available in a couple of days. This information is not intended to replace advice given to you by your health care provider. Make sure you discuss any questions you have with your health care provider. Document Released: 02/15/2000 Document Revised: 07/26/2015 Document Reviewed: 10/25/2012 Elsevier Interactive Patient Education  2017 Elsevier  Inc.   Idiopathic Intracranial Hypertension Idiopathic intracranial hypertension (IIH) is a condition that increases pressure around the brain. The fluid that surrounds the brain and spinal cord (cerebrospinal fluid, CSF) increases and causes the pressure. Idiopathic means that the cause of this condition is not known. IIH affects the brain and spinal cord (is a neurological disorder). If this condition is not treated, it can cause vision loss or blindness. What increases the risk? You are more likely to develop this condition if:  You are severely overweight (obese).  You are a woman who has not gone through menopause.  You take certain medicines, such as birth control or steroids.  What are the signs or symptoms? Symptoms of IIH include:  Headaches. This is the most common symptom.  Pain in the shoulders or neck.  Nausea and vomiting.  A "rushing water" or pulsing sound within the ears (pulsatile tinnitus).  Double vision.  Blurred vision.  Brief episodes of complete vision loss.  How is this diagnosed? This condition may be diagnosed based on:  Your symptoms.  Your medical history.  CT scan of the brain.  MRI of the brain.  Magnetic resonance venogram (MRV) to check veins in the brain.  Diagnostic lumbar puncture. This is a procedure to remove and examine a sample of cerebrospinal fluid. This procedure can determine whether too much fluid may be causing IIH.  A thorough eye exam to check for swelling or nerve damage in the eyes.  How is this treated? Treatment for this condition depends on your symptoms. The goal of treatment is to decrease the pressure around your brain. Common treatments include:  Medicines to decrease the production of spinal fluid and lower the pressure within your skull.  Medicines to prevent or treat headaches.  Surgery to place drains (shunts) in your brain to remove excess fluid.  Lumbar puncture to remove excess cerebrospinal  fluid.  Follow these instructions at home:  If you are overweight or obese, work with your health care provider to lose weight.  Take over-the-counter and prescription medicines only as told by your health care provider.  Do not drive or use heavy machinery while taking medicines that can make you sleepy.  Keep all follow-up visits as told by your health care provider. This is important. Contact a health care provider if:  You have changes in your vision, such as: ? Double vision. ? Not being able to see colors (color vision). Get help right away if:  You have any of the following symptoms and they get worse or do not get better. ? Headaches. ? Nausea. ? Vomiting. ? Vision changes or difficulty seeing. Summary  Idiopathic intracranial hypertension (IIH) is a condition that increases pressure around the brain. The cause is not known (is idiopathic).  The most common symptom of IIH is headaches.  Treatment may include medicines or surgery to relieve the pressure on your brain. This information is not intended to replace advice given to you by your health care provider. Make sure you discuss any questions you have with your health care provider. Document Released:  04/28/2001 Document Revised: 01/09/2016 Document Reviewed: 01/09/2016 Elsevier Interactive Patient Education  2017 [employer].

## 2017-07-28 NOTE — Addendum Note (Signed)
Addended by: Naomie Dean B on: 07/28/2017 08:55 AM   Modules accepted: Orders

## 2017-07-28 NOTE — Progress Notes (Addendum)
[facility]    Provider:  Dr Lucia Gaskins Referring Provider: Blane Ohara, MD Primary Care Physician:  Blane Ohara, MD  CC:  migraines  HPI:  Betty Rogers is a 22 y.o. female here as a referral from Dr. Sedalia Muta for migraines. PMHx anxiety, depression, psychosis, panic attacks, migraine, autism spectrum d/o.  She is on the Aimovig autoinjector once a month. Patient is here with migraines who provide mot information. Started 5 years ago. Mother with migraines. Theys tart in the back of the head, can be unilateral, sharp and throbbing, +light and +sound sensitivity, +nausea, no vomiting. Sometimes she gets blurry an it is hard for her to read. She has ringing in her ears. Has  A lot of neck pain and tightness in her. Daily continuous headaches for over 6 months. The migraines last all day 24 hours. They start right before bed, can be positionally worse. 1/2 the month is migrainous. No medication overuse. She is on Aimovig monthly. She is on  monthly. Sleep apnea test was negative.   Meds tried: Topamax, Seroquel, Prozac, Depakote, Vistaril, Ativan, buspar, fioricet, compazine, imitrex, Zofran  Reviewed notes, labs and imaging from outside physicians, which showed:  MRI brain 07/22/2017: showed No acute intracranial abnormalities including mass lesion or mass effect, hydrocephalus, extra-axial fluid collection, midline shift, hemorrhage, or acute infarction, large ischemic events (personally reviewed images)  Tsh,hgba1c unremarkable   Review of Systems: Patient complains of symptoms per HPI as well as the following symptoms: Blurred vision, weight gain, chest pain, confusion, headache, weakness, sleepiness, dizziness, depression, anxiety, too much sleep, not enough sleep, decreased energy, change in appetite, disinterest in activities, hallucinations and racing thoughts. Pertinent negatives and positives per HPI. All others negative.   Social History   Socioeconomic History   . Marital status: Single    Spouse name: Not on file  . Number of children: Not on file  . Years of education: Not on file  . Highest education level: High school graduate  Occupational History  . Not on file  Social Needs  . Financial resource strain: Not on file  . Food insecurity:    Worry: Not on file    Inability: Not on file  . Transportation needs:    Medical: Not on file    Non-medical: Not on file  Tobacco Use  . Smoking status: Never Smoker  . Smokeless tobacco: Never Used  Substance and Sexual Activity  . Alcohol use: Yes    Frequency: Never    Comment: "a half a wine cooler occasionally"  . Drug use: Never  . Sexual activity: Not Currently    Birth control/protection: Abstinence, None  Lifestyle  . Physical activity:    Days per week: Not on file    Minutes per session: Not on file  . Stress: Not on file  Relationships  . Social connections:    Talks on phone: Not on file    Gets together: Not on file    Attends religious service: Not on file    Active member of club or organization: Not on file    Attends meetings of clubs or organizations: Not on file    Relationship status: Not on file  . Intimate partner violence:    Fear of current or ex partner: Not on file    Emotionally abused: Not on file    Physically abused: Not on file    Forced sexual activity: Not on file  Other Topics Concern  . Not on file  Social  History Narrative   Lives at home with her parents   Right handed   No caffeine    Family History  Problem Relation Age of Onset  . Heart attack Other   . Cancer Other     Past Medical History:  Diagnosis Date  . Anxiety and depression   . Migraines   . Panic attacks     Past Surgical History:  Procedure Laterality Date  . CHOLECYSTECTOMY    . CHOLECYSTECTOMY    . REMOVAL BIRTH MARK ON HEAD   2008    Current Outpatient Medications  Medication Sig Dispense Refill  . busPIRone (BUSPAR) 7.5 MG tablet Take 1 tablet (7.5 mg  total) by mouth 3 (three) times daily. 30 tablet 0  . divalproex (DEPAKOTE ER) 250 MG 24 hr tablet Take 3 tablets (750 mg total) by mouth daily with supper. 90 tablet 0  . FLUoxetine (PROZAC) 20 MG capsule Take 1 capsule (20 mg total) by mouth daily. 30 capsule 0  . hydrOXYzine (ATARAX/VISTARIL) 25 MG tablet Take 1 tablet (25 mg total) by mouth every 4 (four) hours as needed for anxiety. 30 tablet 0  . LORazepam (ATIVAN) 1 MG tablet Take 1 mg by mouth 2 (two) times daily.    . prochlorperazine (COMPAZINE) 10 MG tablet Take 10 mg by mouth 3 (three) times daily.    . QUEtiapine (SEROQUEL) 300 MG tablet Take 1 tablet (300 mg total) by mouth at bedtime. 30 tablet 0  . SUMAtriptan (IMITREX) 100 MG tablet Take 1 tablet (100 mg total) by mouth every 2 (two) hours as needed for migraine or headache. May repeat in 2 hours if headache persists or recurs. 10 tablet 0  . butalbital-acetaminophen-caffeine (FIORICET, ESGIC) 50-325-40 MG tablet Take 1-2 tablets by mouth every 4 (four) hours as needed for headache. No more than 6 per day     No current facility-administered medications for this visit.     Allergies as of 07/28/2017 - Review Complete 07/28/2017  Allergen Reaction Noted  . Nickel  07/18/2017  . Other Itching 07/18/2017  . Latex Rash 07/28/2017    Vitals: BP 118/90 (BP Location: Right Arm, Patient Position: Sitting)   Pulse (!) 128   Ht  (1.626 m)   Wt 181 lb (82.1 kg)   BMI 31.07 kg/m  Last Weight:  Wt Readings from Last 1 Encounters:  07/28/17 181 lb (82.1 kg)   Last Height:   Ht Readings from Last 1 Encounters:  07/28/17  (1.626 m)   Physical exam: Exam: Gen: NAD, conversant, well nourised, obese, well groomed                     CV: RRR, no MRG. No Carotid Bruits. No peripheral edema, warm, nontender Eyes: Conjunctivae clear without exudates or hemorrhage  Neuro: Detailed Neurologic Exam  Speech:    Speech is normal; fluent and spontaneous with normal  comprehension.  Cognition:    The patient is oriented to person, place, and time;     recent and remote memory intact;     language fluent;     normal attention, concentration,     fund of knowledge Cranial Nerves:    The pupils are equal, round, and reactive to light. The fundi are normal and spontaneous venous pulsations are present. Visual fields are full to finger confrontation. Extraocular movements are intact. Trigeminal sensation is intact and the muscles of mastication are normal. The face is symmetric. The palate elevates in  the midline. Hearing intact. Voice is normal. Shoulder shrug is normal. The tongue has normal motion without fasciculations.   Coordination:    Normal finger to nose and heel to shin. Normal rapid alternating movements.   Gait:    Heel-toe and tandem gait are normal.   Motor Observation:    No asymmetry, no atrophy, and no involuntary movements noted. Tone:    Normal muscle tone.    Posture:    Posture is normal. normal erect    Strength:    Strength is V/V in the upper and lower limbs.      Sensation: intact to LT     Reflex Exam:  DTR's:    Deep tendon reflexes in the upper and lower extremities are normal bilaterally.   Toes:    The toes are downgoing bilaterally.   Clonus:    Clonus is absent.   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    Botox for migraines Lumbar puncture for IIH Physical therapy for the neck and dry needling:  Physical Therapy: Cervical myofascial pain, forward posture contributing to migraines and cervicalgia. Please evaluate and treat including dry needling, stretching, strengthening, manual therapy/massage, heating, TENS unit, exercising for scapular stabilization, pectoral stretching and rhomboid strengthening as clinically warranted as well as any other modality as recommended by evaluation.   Continue Aimovig  Orders Placed This Encounter  Procedures  . DG FLUORO GUIDED LOC OF  NEEDLE/CATH TIP FOR SPINAL INJECT LT  . Ambulatory referral to Physical Therapy      Discussed: To prevent or relieve headaches, try the following: Cool Compress. Lie down and place a cool compress on your head.  Avoid headache triggers. If certain foods or odors seem to have triggered your migraines in the past, avoid them. A headache diary might help you identify triggers.  Include physical activity in your daily routine. Try a daily walk or other moderate aerobic exercise.  Manage stress. Find healthy ways to cope with the stressors, such as delegating tasks on your to-do list.  Practice relaxation techniques. Try deep breathing, yoga, massage and visualization.  Eat regularly. Eating regularly scheduled meals and maintaining a healthy diet might help prevent headaches. Also, drink plenty of fluids.  Follow a regular sleep schedule. Sleep deprivation might contribute to headaches Consider biofeedback. With this mind-body technique, you learn to control certain bodily functions - such as muscle tension, heart rate and blood pressure - to prevent headaches or reduce headache pain.    Proceed to emergency room if you experience new or worsening symptoms or symptoms do not resolve, if you have new neurologic symptoms or if headache is severe, or for any concerning symptom.   Provided education and documentation from American headache Society toolbox including articles on: chronic migraine medication overuse headache, chronic migraines, prevention of migraines, behavioral and other nonpharmacologic treatments for headache.    Naomie Dean, MD  [facility] [address] [place], [place] [address]  Phone [phone] Fax [phone]

## 2017-07-28 NOTE — Telephone Encounter (Signed)
New Botox patient she is on Dr. Trevor Mace schedule for 08/24/2017 .

## 2017-07-29 ENCOUNTER — Ambulatory Visit
Admission: RE | Admit: 2017-07-29 | Discharge: 2017-07-29 | Disposition: A | Discharge status: Home / Self Care | Payer: Medicaid Other | Source: Ambulatory Visit | Attending: Neurology | Admitting: Neurology

## 2017-07-29 VITALS — BP 97/52 | HR 105

## 2017-07-29 DIAGNOSIS — G43711 Chronic migraine without aura, intractable, with status migrainosus: Secondary | ICD-10-CM

## 2017-07-29 LAB — CSF CELL COUNT WITH DIFFERENTIAL
RBC COUNT CSF: 0 {cells}/uL (ref 0–10)
WBC, CSF: 1 cells/uL (ref 0–5)

## 2017-07-29 LAB — GLUCOSE, CSF: GLUCOSE CSF: 58 mg/dL (ref 40–80)

## 2017-07-29 LAB — PROTEIN, CSF: Total Protein, CSF: 58 mg/dL — ABNORMAL HIGH (ref 15–45)

## 2017-07-29 NOTE — Telephone Encounter (Signed)
Noted, thank you

## 2017-07-29 NOTE — Discharge Instructions (Signed)

## 2017-07-30 ENCOUNTER — Telehealth: Payer: Self-pay | Admitting: *Deleted

## 2017-07-30 NOTE — Telephone Encounter (Addendum)
Called pt's mother's number, main number on chart. LVM asking for call back. When she calls back, please tell her that Dr. Lucia Gaskins says regarding her LP, pt's opening pressure normal, good news there is no intracranial hypertension. We will continue to treat for migraines with Aimovig  ----- Message from Anson Fret, MD sent at 07/29/2017 12:27 PM EDT ----- Opening pressure normal, good news there is no intracranial hypertension. We will continue to treat for migraines with Aimovig. thanks

## 2017-07-30 NOTE — Telephone Encounter (Signed)
Pt returning RNs' call, advised of what had been noted. Pt verbally understood and appreciated the call

## 2017-08-04 ENCOUNTER — Telehealth: Payer: Self-pay | Admitting: Neurology

## 2017-08-04 NOTE — Telephone Encounter (Signed)
[facility] and new patient enrollment sent to [facility] (phone [phone]).

## 2017-08-06 ENCOUNTER — Ambulatory Visit: Payer: Medicaid Other | Admitting: Physical Therapy

## 2017-08-06 ENCOUNTER — Telehealth: Payer: Self-pay | Admitting: *Deleted

## 2017-08-06 NOTE — Telephone Encounter (Signed)
Pts mother returning RNs call, stating that she had filled out her name by mistake, also stating that she is taking the pt to and from appts that she must have filled out the paperwork incorrectly.

## 2017-08-06 NOTE — Telephone Encounter (Signed)
I called and spoke with the patients mom (listed on DPR). They would like to be schedule for July 11th at 4:30, would you please work this in [place]?

## 2017-08-06 NOTE — Telephone Encounter (Signed)
Note pt is scheduled for botox on 6/24@ 3:30 but MD will be out of office at that time. Pt will need to be r/s.

## 2017-08-06 NOTE — Telephone Encounter (Addendum)
Called, LVM for mother to call back. Need some clarification on medicaid transportation exception verification form they wanted Dr. Lucia GaskinsAhern to complete.  1. Mother's name is on the form, not the daughter. Dr. Lucia GaskinsAhern is seeing her daughter, Betty Rogers. Needing to know why her mother's name is on the form and not her daughter's.  2. Reason they need form completed: Why do they need transportation to and from appt?

## 2017-08-06 NOTE — Telephone Encounter (Signed)
I called mother back. There was some confusion about the form. She did not have form sent to our office. [facility]. She sometimes have transportation bring her to and from appt if she cannot take her. She gets reimbursed if she brings her daughter to appt. She does not need forms filled out. She advised us to disregard this and shred. Nothing further needed at this time.

## 2017-08-06 NOTE — Telephone Encounter (Signed)
Noted, thank you. DW  °

## 2017-08-06 NOTE — Telephone Encounter (Signed)
Pt scheduled for July 11th @ 4:30 and the June 26th 3:30 appt was canceled.

## 2017-08-10 NOTE — Telephone Encounter (Signed)
Patient is scheduled to 09/10/17 due to Dr. Lucia GaskinsAhern out of the office.

## 2017-08-13 NOTE — Telephone Encounter (Signed)
Insurance wants her to try more preventative medications before they will approve botox. I suggest we try propranolol next this is a class she has not tried. If she is interested please call in propranol 20mg  bid and we can furtjer increase as tolerated an dneeded. Common propranolol side effects may include:nausea, vomiting, diarrhea, constipation, stomach cramps; decreased sex drive, impotence, or difficulty having an orgasm;sleep problems (insomnia); or tired feeling. Do NOT get pregnant on this medication. Stop for chest pain, dizziness, SOB or anything significant

## 2017-08-13 NOTE — Telephone Encounter (Signed)
[facility] and they gave the reason for the denial in letter. I will place the letter for Dr. Lucia GaskinsAhern to review on Bethany's desk.

## 2017-08-17 MED ORDER — PROPRANOLOL HCL 20 MG PO TABS: 20.0000 mg | ORAL_TABLET | Freq: Two times a day (BID) | ORAL | 11 refills | Status: DC

## 2017-08-17 NOTE — Telephone Encounter (Signed)
Noted, thank you

## 2017-08-17 NOTE — Addendum Note (Signed)
Addended by: Judi CongBRUNO, Jigar Zielke C on: 08/17/2017 11:17 AM   Modules accepted: Orders

## 2017-08-17 NOTE — Telephone Encounter (Signed)
Called the mother and made her aware of the denial of the botox and the reason why. Pt mother had already received a denial as well. I went over trying the new medication propranolol and she is agreeable to trying that. I went over the side effects of the medication and to keep a close eye on if it effects her BP. I informed her that we would start at a lower dose and if she feels that she is tolerating it well but its not helping completely with the headache prevention then we could always increase the dosage. The patients mother would like to keep the apt in July that is scheduled. I informed her that would be fine. Pt verbalized understanding. Will send the medication to [facility] on file and per mom

## 2017-08-21 ENCOUNTER — Encounter: Payer: Self-pay | Admitting: Physical Therapy

## 2017-08-21 ENCOUNTER — Ambulatory Visit: Payer: Medicaid Other | Attending: Neurology | Admitting: Physical Therapy

## 2017-08-21 ENCOUNTER — Other Ambulatory Visit: Payer: Self-pay

## 2017-08-21 DIAGNOSIS — M62838 Other muscle spasm: Secondary | ICD-10-CM

## 2017-08-21 DIAGNOSIS — R293 Abnormal posture: Secondary | ICD-10-CM

## 2017-08-21 NOTE — Therapy (Signed)
[facility] [address] [place], [place], [address] Phone: [phone]   Fax:  [phone]  Physical Therapy Evaluation  Patient Details  Name: Betty Rogers MRN: [id number] Date of Birth: Jun 04, 1995 Referring Provider: Dr Naomie Dean   Encounter Date: 08/21/2017  PT End of Session - 08/21/17 1230    Visit Number  1    Number of Visits  4    Date for PT Re-Evaluation  09/18/17    Authorization Type  Medicaid     PT Start Time  1100    PT Stop Time  1140    PT Time Calculation (min)  40 min    Activity Tolerance  Patient tolerated treatment well    Behavior During Therapy  [facility] for tasks assessed/performed       Past Medical History:  Diagnosis Date  . Anxiety and depression   . Migraines   . Panic attacks     Past Surgical History:  Procedure Laterality Date  . CHOLECYSTECTOMY    . CHOLECYSTECTOMY    . REMOVAL BIRTH MARK ON HEAD   2008    There were no vitals filed for this visit.   Subjective Assessment - 08/21/17 1105    Subjective  Patient has had headaches for several years. When she has them she feels pressure in the bcak of her head. The pain can last for days. Her last bad headache was about 2 weeks ago. She can not remeber doing anything out of the ordinary.     Limitations  House hold activities;Reading    Diagnostic tests  Brain MRI:  negative for acute     Patient Stated Goals  To have less headaches;    Currently in Pain?  Yes    Pain Score  0-No pain Pain is a 10/10 when she has pain    Pain Location  Head    Pain Orientation  Distal;Mid    Pain Descriptors / Indicators  Aching    Pain Type  Acute pain    Pain Onset  More than a month ago    Pain Frequency  Constant    Aggravating Factors   light;     Pain Relieving Factors  Ice on the back of her head     Effect of Pain on Daily Activities  difficulty perfroming daily tasks     Multiple Pain Sites  No         [facility] PT Assessment -  08/21/17 0001      Assessment   Medical Diagnosis  Headaches     Referring Provider  Dr Naomie Dean    Onset Date/Surgical Date  -- More then 10 years     Next MD Visit  09/10/2017      Precautions   Precautions  None      Restrictions   Weight Bearing Restrictions  No      Balance Screen   Has the patient fallen in the past 6 months  No    Has the patient had a decrease in activity level because of a fear of falling?   No    Is the patient reluctant to leave their home because of a fear of falling?   No      Prior Function   Level of Independence  Independent    Leisure  Daily activity       Cognition   Overall Cognitive Status  Within Functional Limits for tasks assessed    Attention  Focused    Focused Attention  Appears intact    Memory  Appears intact    Awareness  Appears intact    Problem Solving  Appears intact      Sensation   Light Touch  Appears Intact    Additional Comments  denies parathesias       Coordination   Gross Motor Movements are Fluid and Coordinated  Yes    Fine Motor Movements are Fluid and Coordinated  Yes      Posture/Postural Control   Posture Comments  Forward head; rounded shoulders;       ROM / Strength   AROM / PROM / Strength  AROM;PROM;Strength      AROM   AROM Assessment Site  Cervical    Cervical Flexion  40    Cervical Extension  18    Cervical - Right Rotation  55    Cervical - Left Rotation  58      Strength   Strength Assessment Site  Cervical;Shoulder    Right/Left Shoulder  Right;Left    Right Shoulder Flexion  5/5    Right Shoulder Internal Rotation  4+/5    Right Shoulder External Rotation  4+/5    Left Shoulder Flexion  5/5    Left Shoulder Internal Rotation  4+/5    Left Shoulder External Rotation  4+/5      Palpation   Palpation comment  bilateral spasming of the upper traps                Objective measurements completed on examination: See above findings.      [facility] Adult PT  Treatment/Exercise - 08/21/17 0001      Self-Care   Self-Care  Other Self-Care Comments    Other Self-Care Comments   reviewed use of theracane for trigger point release; reviewed sub-occipital release with patients mother for when the patient has headaches.       Shoulder Exercises: Standing   Other Standing Exercises  shoulder extension x10 yellow with cuing for posture; scpa retraction 2x10 with cuing for posture              PT Education - 08/21/17 1110    Education Details  postural correction     Person(s) Educated  Patient    Methods  Explanation;Demonstration;Tactile cues;Verbal cues    Comprehension  Verbalized understanding;Verbal cues required;Tactile cues required;Returned demonstration       PT Short Term Goals - 08/21/17 1227      PT SHORT TERM GOAL #1   Title  Patients mother will be independent with sub-occiptial release techniques to help with headaches     Baseline  needs further education     Time  3    Period  Weeks    Status  New    Target Date  09/18/17      PT SHORT TERM GOAL #2   Title  Patient will demosntrate 5/5 gross bilateral ER/IR strength     Baseline  ER and IR strength 4+/5 bilateral     Time  3    Period  Weeks    Status  New    Target Date  09/18/17      PT SHORT TERM GOAL #3   Title  Patient will report a 75% reduction in headache duration     Baseline  headaches last an average of 3 days     Time  3    Period  Weeks    Status  New    Target Date  09/18/17                Plan - 08/21/17 1235    Clinical Impression Statement  Patient is a 22 year old female who presents with headaches that are likley exacerbated by poor posture and trigger points throughout her cervical and upper shoulder areas. she has mild weakness of her shoulder rotators. 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 She owuld benefit from skiled therapy for postural correction, soft tissue  mobilization; manual therapy for cervial headache mangement; and trigger point dry needling     History and Personal Factors relevant to plan of care:  anxiety     Clinical Presentation  Stable    Clinical Presentation due to:  Pain has improved recently     Clinical Decision Making  Low    Rehab Potential  Good    PT Frequency  1x / week    PT Duration  3 weeks    PT Treatment/Interventions  ADLs/Self Care Home Management;Cryotherapy;Electrical Stimulation;Iontophoresis 4mg /ml Dexamethasone;Moist Heat;Ultrasound;Therapeutic exercise;Therapeutic activities;Neuromuscular re-education;Patient/family education;Manual techniques;Passive range of motion;Taping;Splinting    PT Next Visit Plan  trigger point dry needling if idicated. Review resoonse to new exercises; add seated Era ; seated abduction; manual therapy to trigger points     PT Home Exercise Plan  scpa retraction, extension     Consulted and Agree with Plan of Care  Patient       Patient will benefit from skilled therapeutic intervention in order to improve the following deficits and impairments:  Improper body mechanics, Postural dysfunction, Increased muscle spasms, Decreased activity tolerance, Decreased endurance, Decreased range of motion, Decreased strength  Visit Diagnosis: Abnormal posture  Other muscle spasm     Problem List Patient Active Problem List   Diagnosis Date Noted  . Chronic migraine without aura, with intractable migraine, so stated, with status migrainosus 07/28/2017  . Psychosis (HCC) 07/18/2017    Dessie Coma  PT DPT  08/21/2017, 12:41 PM  [facility] [address] [place], [place], [address] Phone: [phone]   Fax:  [phone]  Name: Darlynn Ricco MRN: [id number] Date of Birth: 12/11/1995

## 2017-08-21 NOTE — Addendum Note (Signed)
Addended by: Dessie ComaARROLL, Kolleen Ochsner J on: 08/21/2017 01:07 PM   Modules accepted: Orders

## 2017-08-24 ENCOUNTER — Ambulatory Visit: Payer: Medicaid Other | Admitting: Neurology

## 2017-08-27 NOTE — Telephone Encounter (Signed)
Toma Copier[place]ethany, this patient's mother would like to move her apt on 07/11 to earlier in the day. If not she would like an office visit another day. I offered her the first available apt which was August. She wanted something sooner than that. Please call and advise.

## 2017-08-27 NOTE — Telephone Encounter (Signed)
Pt mother(on DPR[place],kathelee,[phone]) has called she is asking for a call to discuss r/s this appointment.  Please call

## 2017-08-28 NOTE — Telephone Encounter (Addendum)
Called pt's mother Nicholos JohnsKathleen (on [place]) to offer the 1:00 appt on wed 7/10 but she could not do a Wednesday. Stated they could only do Thursdays and Fridays. She did not have her calendar with her so she will call back to r/s. She stated she was ok with pt seeing an NP. Also, she is aware that booking will likely be into August if we cannot find an open appt sooner. She is aware of office hours today. She verbalized appreciation.

## 2017-09-04 ENCOUNTER — Ambulatory Visit: Payer: Medicaid Other | Admitting: Physical Therapy

## 2017-09-09 ENCOUNTER — Ambulatory Visit: Payer: Self-pay | Admitting: Neurology

## 2017-09-10 ENCOUNTER — Ambulatory Visit: Payer: Self-pay | Admitting: Neurology

## 2017-09-14 ENCOUNTER — Ambulatory Visit (INDEPENDENT_AMBULATORY_CARE_PROVIDER_SITE_OTHER): Payer: Medicaid Other | Admitting: Podiatry

## 2017-09-14 ENCOUNTER — Encounter: Payer: Self-pay | Admitting: Podiatry

## 2017-09-14 ENCOUNTER — Ambulatory Visit (INDEPENDENT_AMBULATORY_CARE_PROVIDER_SITE_OTHER): Payer: Medicaid Other

## 2017-09-14 VITALS — BP 102/69 | HR 90 | Temp 98.6°F | Resp 15

## 2017-09-14 DIAGNOSIS — Q666 Other congenital valgus deformities of feet: Secondary | ICD-10-CM | POA: Diagnosis not present

## 2017-09-14 DIAGNOSIS — M2142 Flat foot [pes planus] (acquired), left foot: Secondary | ICD-10-CM

## 2017-09-14 DIAGNOSIS — M2141 Flat foot [pes planus] (acquired), right foot: Secondary | ICD-10-CM

## 2017-09-14 DIAGNOSIS — M779 Enthesopathy, unspecified: Secondary | ICD-10-CM

## 2017-09-14 DIAGNOSIS — M7751 Other enthesopathy of right foot: Secondary | ICD-10-CM | POA: Diagnosis not present

## 2017-09-14 NOTE — Progress Notes (Signed)
  Subjective:  Patient ID: Betty Rogers, female    DOB: 02/10/1996,  MRN: [id number]  Chief Complaint  Patient presents with  . Foot Pain    Flat feet-Pain: B/L mid arch pain x years; 6/10 intermittent pain -worst when walking long distances Tx: OTC inserts    22 y.o. female presents with the above complaint.  Pain with walking long distances has tried over-the-counter orthotics without any help.  Believes her feet are very flat and she walks "like a duck".  Past Medical History:  Diagnosis Date  . Anxiety and depression   . Migraines   . Panic attacks    Past Surgical History:  Procedure Laterality Date  . CHOLECYSTECTOMY    . CHOLECYSTECTOMY    . REMOVAL BIRTH MARK ON HEAD   2008    Current Outpatient Medications:  .  busPIRone (BUSPAR) 7.5 MG tablet, Take 1 tablet (7.5 mg total) by mouth 3 (three) times daily., Disp: 30 tablet, Rfl: 0 .  butalbital-acetaminophen-caffeine (FIORICET, ESGIC) 50-325-40 MG tablet, Take 1-2 tablets by mouth every 4 (four) hours as needed for headache. No more than 6 per day, Disp: , Rfl:  .  divalproex (DEPAKOTE ER) 250 MG 24 hr tablet, Take 3 tablets (750 mg total) by mouth daily with supper., Disp: 90 tablet, Rfl: 0 .  FLUoxetine (PROZAC) 20 MG capsule, Take 1 capsule (20 mg total) by mouth daily., Disp: 30 capsule, Rfl: 0 .  hydrOXYzine (ATARAX/VISTARIL) 25 MG tablet, Take 1 tablet (25 mg total) by mouth every 4 (four) hours as needed for anxiety., Disp: 30 tablet, Rfl: 0 .  LORazepam (ATIVAN) 1 MG tablet, Take 1 mg by mouth 2 (two) times daily., Disp: , Rfl:  .  prochlorperazine (COMPAZINE) 10 MG tablet, Take 10 mg by mouth 3 (three) times daily., Disp: , Rfl:  .  propranolol (INDERAL) 20 MG tablet, Take 1 tablet (20 mg total) by mouth 2 (two) times daily., Disp: 60 tablet, Rfl: 11 .  QUEtiapine (SEROQUEL) 300 MG tablet, Take 1 tablet (300 mg total) by mouth at bedtime., Disp: 30 tablet, Rfl: 0 .  SUMAtriptan (IMITREX) 100 MG tablet, Take 1  tablet (100 mg total) by mouth every 2 (two) hours as needed for migraine or headache. May repeat in 2 hours if headache persists or recurs., Disp: 10 tablet, Rfl: 0  Allergies  Allergen Reactions  . Latex Rash  . Nickel Rash  . Other Itching    Metal    Review of Systems: Negative except as noted in the HPI. Denies N/V/F/Ch. Objective:   Vitals:   09/14/17 1459  BP: 102/69  Pulse: 90  Resp: 15  Temp: 98.6 F (37 C)   General AA&O x3. Normal mood and affect.  Vascular Dorsalis pedis and posterior tibial pulses  present 2+ bilaterally  Capillary refill normal to all digits. Pedal hair growth normal.  Neurologic Epicritic sensation grossly present.  Dermatologic No open lesions. Interspaces clear of maceration. Nails well groomed and normal in appearance.  Orthopedic: MMT 5/5 in dorsiflexion, plantarflexion, inversion, and eversion. Normal joint ROM without pain or crepitus. Pes planus. Hindfoot supple but with rigid end feel. Collapsing medial arch in stance. Forefoot abduction bilat.   Assessment & Plan:  Patient was evaluated and treated and all questions answered.  Pes planus -XR taken and reviewed. No true signs of coalition but cannot exclude. -Discussed OTC vs CMOs. Powersteps dispensed today.  F/u PRN.  Return if symptoms worsen or fail to improve.

## 2017-09-14 NOTE — Progress Notes (Signed)
   Subjective:    Patient ID: Betty Rogers, female    DOB: 07/19/1995, 22 y.o.   MRN: [id number]  HPI    Review of Systems  All other systems reviewed and are negative.      Objective:   Physical Exam        Assessment & Plan:

## 2017-09-22 ENCOUNTER — Other Ambulatory Visit: Payer: Self-pay | Admitting: Podiatry

## 2017-09-22 DIAGNOSIS — Q666 Other congenital valgus deformities of feet: Secondary | ICD-10-CM

## 2017-09-22 DIAGNOSIS — M2142 Flat foot [pes planus] (acquired), left foot: Principal | ICD-10-CM

## 2017-09-22 DIAGNOSIS — M2141 Flat foot [pes planus] (acquired), right foot: Secondary | ICD-10-CM

## 2017-09-25 ENCOUNTER — Ambulatory Visit: Payer: Medicaid Other | Admitting: Physical Therapy

## 2017-10-07 ENCOUNTER — Ambulatory Visit: Payer: Medicaid Other | Admitting: Neurology

## 2017-10-26 ENCOUNTER — Ambulatory Visit: Payer: Medicaid Other | Admitting: Neurology

## 2017-10-26 ENCOUNTER — Encounter: Payer: Self-pay | Admitting: Neurology

## 2017-10-26 VITALS — BP 92/69 | HR 83 | Ht 64.0 in | Wt 179.0 lb

## 2017-10-26 DIAGNOSIS — G43711 Chronic migraine without aura, intractable, with status migrainosus: Secondary | ICD-10-CM | POA: Diagnosis not present

## 2017-10-26 NOTE — Progress Notes (Signed)
[facility]    Provider:  Dr Lucia Gaskins Referring Provider: Blane Ohara, MD Primary Care Physician:  Blane Ohara, MD  CC:  Migraines  Interval history 10/26/2017:  We started propranolol, she has 15 headaches days a month and she feels improved on the propranolol but still having 8 migraine days a month. But she feels the Aimovig is helping so much she is doing well. Lately she has been having tremors, in the hands when she picks up things. She is very shaky. TSH normal. Tremors started 2 months ago.    HPI:  Betty Rogers is a 22 y.o. female here as a referral from Dr. Sedalia Muta for migraines. PMHx anxiety, depression, psychosis, panic attacks, migraine, autism spectrum d/o.  She is on the Aimovig autoinjector once a month. Patient is here with migraines who provide mot information. Started 5 years ago. Mother with migraines. Theys tart in the back of the head, can be unilateral, sharp and throbbing, +light and +sound sensitivity, +nausea, no vomiting. Sometimes she gets blurry an it is hard for her to read. She has ringing in her ears. Has  A lot of neck pain and tightness in her. Daily continuous headaches for over 6 months. The migraines last all day 24 hours. They start right before bed, can be positionally worse. 1/2 the month is migrainous. No medication overuse. She is on Aimovig monthly. She is on 140mg  monthly. Sleep apnea test was negative.   Meds tried: Topamax, Seroquel, Prozac, Depakote, Vistaril, Ativan, buspar, fioricet, compazine, imitrex, Zofran, propranolol  Reviewed notes, labs and imaging from outside physicians, which showed:  MRI brain 07/22/2017: showed No acute intracranial abnormalities including mass lesion or mass effect, hydrocephalus, extra-axial fluid collection, midline shift, hemorrhage, or acute infarction, large ischemic events (personally reviewed images)  Tsh,hgba1c unremarkable   Review of Systems: Patient complains of symptoms per HPI as  well as the following symptoms: Blurred vision, weight gain, chest pain, confusion, headache, weakness, sleepiness, dizziness, depression, anxiety, too much sleep, not enough sleep, decreased energy, change in appetite, disinterest in activities, hallucinations and racing thoughts. Pertinent negatives and positives per HPI. All others negative.   Social History   Socioeconomic History  . Marital status: Single    Spouse name: Not on file  . Number of children: Not on file  . Years of education: Not on file  . Highest education level: High school graduate  Occupational History  . Not on file  Social Needs  . Financial resource strain: Not on file  . Food insecurity:    Worry: Not on file    Inability: Not on file  . Transportation needs:    Medical: Not on file    Non-medical: Not on file  Tobacco Use  . Smoking status: Never Smoker  . Smokeless tobacco: Never Used  Substance and Sexual Activity  . Alcohol use: Yes    Frequency: Never    Comment: "a half a wine cooler occasionally"  . Drug use: Never  . Sexual activity: Not Currently    Birth control/protection: Abstinence, None  Lifestyle  . Physical activity:    Days per week: Not on file    Minutes per session: Not on file  . Stress: Not on file  Relationships  . Social connections:    Talks on phone: Not on file    Gets together: Not on file    Attends religious service: Not on file    Active member of club or organization: Not on file  Attends meetings of clubs or organizations: Not on file    Relationship status: Not on file  . Intimate partner violence:    Fear of current or ex partner: Not on file    Emotionally abused: Not on file    Physically abused: Not on file    Forced sexual activity: Not on file  Other Topics Concern  . Not on file  Social History Narrative   Lives at home with her parents   Right handed   No caffeine    Family History  Problem Relation Age of Onset  . Heart attack Other     . Cancer Other     Past Medical History:  Diagnosis Date  . Anxiety and depression   . Migraines   . Panic attacks     Past Surgical History:  Procedure Laterality Date  . CHOLECYSTECTOMY    . CHOLECYSTECTOMY    . REMOVAL BIRTH MARK ON HEAD   2008    Current Outpatient Medications  Medication Sig Dispense Refill  . busPIRone (BUSPAR) 7.5 MG tablet Take 1 tablet (7.5 mg total) by mouth 3 (three) times daily. 30 tablet 0  . butalbital-acetaminophen-caffeine (FIORICET, ESGIC) 50-325-40 MG tablet Take 1-2 tablets by mouth every 4 (four) hours as needed for headache. No more than 6 per day    . divalproex (DEPAKOTE ER) 250 MG 24 hr tablet Take 3 tablets (750 mg total) by mouth daily with supper. 90 tablet 0  . Erenumab-aooe (AIMOVIG, 140 MG DOSE,) 70 MG/ML SOAJ Inject 140 mg into the skin every 30 (thirty) days.    Marland Kitchen FLUoxetine (PROZAC) 20 MG capsule Take 1 capsule (20 mg total) by mouth daily. 30 capsule 0  . hydrOXYzine (ATARAX/VISTARIL) 25 MG tablet Take 1 tablet (25 mg total) by mouth every 4 (four) hours as needed for anxiety. 30 tablet 0  . LORazepam (ATIVAN) 1 MG tablet Take 1 mg by mouth 2 (two) times daily.    . medroxyPROGESTERone Acetate (DEPO-PROVERA IM) Inject into the muscle every 3 (three) months.    . propranolol (INDERAL) 20 MG tablet Take 1 tablet (20 mg total) by mouth 2 (two) times daily. 60 tablet 11  . SUMAtriptan (IMITREX) 100 MG tablet Take 1 tablet (100 mg total) by mouth every 2 (two) hours as needed for migraine or headache. May repeat in 2 hours if headache persists or recurs. 10 tablet 0  . prochlorperazine (COMPAZINE) 10 MG tablet Take 10 mg by mouth 3 (three) times daily.    . QUEtiapine (SEROQUEL) 300 MG tablet Take 1 tablet (300 mg total) by mouth at bedtime. (Patient not taking: Reported on 10/26/2017) 30 tablet 0   No current facility-administered medications for this visit.     Allergies as of 10/26/2017 - Review Complete 10/26/2017  Allergen  Reaction Noted  . Amitriptyline  11/15/2015  . Latex Rash 07/28/2017  . Nickel Rash 07/18/2017  . Other Itching 07/18/2017    Vitals: BP 92/69 (BP Location: Right Arm, Patient Position: Sitting)   Pulse 83   Ht 5\' 4"  (1.626 m)   Wt 179 lb (81.2 kg)   BMI 30.73 kg/m  Last Weight:  Wt Readings from Last 1 Encounters:  10/26/17 179 lb (81.2 kg)   Last Height:   Ht Readings from Last 1 Encounters:  10/26/17 5\' 4"  (1.626 m)   Physical exam: Exam: Gen: NAD, conversant, well nourised, obese, well groomed  CV: RRR, no MRG. No Carotid Bruits. No peripheral edema, warm, nontender Eyes: Conjunctivae clear without exudates or hemorrhage  Neuro: Detailed Neurologic Exam  Speech:    Speech is normal; fluent and spontaneous with normal comprehension.  Cognition:    The patient is oriented to person, place, and time;     recent and remote memory intact;     language fluent;     normal attention, concentration,     fund of knowledge Cranial Nerves:    The pupils are equal, round, and reactive to light. The fundi are normal and spontaneous venous pulsations are present. Visual fields are full to finger confrontation. Extraocular movements are intact. Trigeminal sensation is intact and the muscles of mastication are normal. The face is symmetric. The palate elevates in the midline. Hearing intact. Voice is normal. Shoulder shrug is normal. The tongue has normal motion without fasciculations.   Coordination:    Normal finger to nose and heel to shin. Normal rapid alternating movements.   Gait:    Heel-toe and tandem gait are normal.   Motor Observation:    No asymmetry, no atrophy, and no involuntary movements noted. Tone:    Normal muscle tone.    Posture:    Posture is normal. normal erect    Strength:    Strength is V/V in the upper and lower limbs.      Sensation: intact to LT     Reflex Exam:  DTR's:    Deep tendon reflexes in the upper and lower  extremities are normal bilaterally.   Toes:    The toes are downgoing bilaterally.   Clonus:    Clonus is absent.   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   New issue Tremors: May be due to medications, follow clinically  New issue Hypersomnia: Discussed good sleep hygiene. She has poor sleep habits such as  she sleeps in the day and also has mood diagnoses that may be contributory  Continue Aimovig for migraines which are improved but still significant recommend botox for migraine in addition  Discussed: To prevent or relieve headaches, try the following: Cool Compress. Lie down and place a cool compress on your head.  Avoid headache triggers. If certain foods or odors seem to have triggered your migraines in the past, avoid them. A headache diary might help you identify triggers.  Include physical activity in your daily routine. Try a daily walk or other moderate aerobic exercise.  Manage stress. Find healthy ways to cope with the stressors, such as delegating tasks on your to-do list.  Practice relaxation techniques. Try deep breathing, yoga, massage and visualization.  Eat regularly. Eating regularly scheduled meals and maintaining a healthy diet might help prevent headaches. Also, drink plenty of fluids.  Follow a regular sleep schedule. Sleep deprivation might contribute to headaches Consider biofeedback. With this mind-body technique, you learn to control certain bodily functions - such as muscle tension, heart rate and blood pressure - to prevent headaches or reduce headache pain.    Proceed to emergency room if you experience new or worsening symptoms or symptoms do not resolve, if you have new neurologic symptoms or if headache is severe, or for any concerning symptom.   Provided education and documentation from American headache Society toolbox including articles on: chronic migraine medication overuse headache, chronic migraines, prevention of  migraines, behavioral and other nonpharmacologic treatments for headache.    Naomie DeanAntonia Kashari Chalmers, MD  [facility] [id number]  [place], [place] [address]  Phone [phone] Fax [phone]  A total of 25 minutes was spent face-to-face with this patient. Over half this time was spent on counseling patient on the  1. Chronic migraine without aura, with intractable migraine, so stated, with status migrainosus    Diagnosis and different diagnostic and therapeutic options, counseling and coordination of care, risks ans benefits of management, compliance, or risk factor reduction and education.

## 2017-10-26 NOTE — Patient Instructions (Signed)
Continue Aimovig and propranolol Follow up as needed   Insomnia Insomnia is a sleep disorder that makes it difficult to fall asleep or to stay asleep. Insomnia can cause tiredness (fatigue), low energy, difficulty concentrating, mood swings, and poor performance at work or school. There are three different ways to classify insomnia:  Difficulty falling asleep.  Difficulty staying asleep.  Waking up too early in the morning.  Any type of insomnia can be long-term (chronic) or short-term (acute). Both are common. Short-term insomnia usually lasts for three months or less. Chronic insomnia occurs at least three times a week for longer than three months. What are the causes? Insomnia may be caused by another condition, situation, or substance, such as:  Anxiety.  Certain medicines.  Gastroesophageal reflux disease (GERD) or other gastrointestinal conditions.  Asthma or other breathing conditions.  Restless legs syndrome, sleep apnea, or other sleep disorders.  Chronic pain.  Menopause. This may include hot flashes.  Stroke.  Abuse of alcohol, tobacco, or illegal drugs.  Depression.  Caffeine.  Neurological disorders, such as Alzheimer disease.  An overactive thyroid (hyperthyroidism).  The cause of insomnia may not be known. What increases the risk? Risk factors for insomnia include:  Gender. Women are more commonly affected than men.  Age. Insomnia is more common as you get older.  Stress. This may involve your professional or personal life.  Income. Insomnia is more common in people with lower income.  Lack of exercise.  Irregular work schedule or night shifts.  Traveling between different time zones.  What are the signs or symptoms? If you have insomnia, trouble falling asleep or trouble staying asleep is the main symptom. This may lead to other symptoms, such as:  Feeling fatigued.  Feeling nervous about going to sleep.  Not feeling rested in the  morning.  Having trouble concentrating.  Feeling irritable, anxious, or depressed.  How is this treated? Treatment for insomnia depends on the cause. If your insomnia is caused by an underlying condition, treatment will focus on addressing the condition. Treatment may also include:  Medicines to help you sleep.  Counseling or therapy.  Lifestyle adjustments.  Follow these instructions at home:  Take medicines only as directed by your health care provider.  Keep regular sleeping and waking hours. Avoid naps.  Keep a sleep diary to help you and your health care provider figure out what could be causing your insomnia. Include: ? When you sleep. ? When you wake up during the night. ? How well you sleep. ? How rested you feel the next day. ? Any side effects of medicines you are taking. ? What you eat and drink.  Make your bedroom a comfortable place where it is easy to fall asleep: ? Put up shades or special blackout curtains to block light from outside. ? Use a white noise machine to block noise. ? Keep the temperature cool.  Exercise regularly as directed by your health care provider. Avoid exercising right before bedtime.  Use relaxation techniques to manage stress. Ask your health care provider to suggest some techniques that may work well for you. These may include: ? Breathing exercises. ? Routines to release muscle tension. ? Visualizing peaceful scenes.  Cut back on alcohol, caffeinated beverages, and cigarettes, especially close to bedtime. These can disrupt your sleep.  Do not overeat or eat spicy foods right before bedtime. This can lead to digestive discomfort that can make it hard for you to sleep.  Limit screen use before bedtime. This  includes: ? Watching TV. ? Using your smartphone, tablet, and computer.  Stick to a routine. This can help you fall asleep faster. Try to do a quiet activity, brush your teeth, and go to bed at the same time each night.  Get  out of bed if you are still awake after 15 minutes of trying to sleep. Keep the lights down, but try reading or doing a quiet activity. When you feel sleepy, go back to bed.  Make sure that you drive carefully. Avoid driving if you feel very sleepy.  Keep all follow-up appointments as directed by your health care provider. This is important. Contact a health care provider if:  You are tired throughout the day or have trouble in your daily routine due to sleepiness.  You continue to have sleep problems or your sleep problems get worse. Get help right away if:  You have serious thoughts about hurting yourself or someone else. This information is not intended to replace advice given to you by your health care provider. Make sure you discuss any questions you have with your health care provider. Document Released: 02/15/2000 Document Revised: 07/20/2015 Document Reviewed: 11/18/2013 Elsevier Interactive Patient Education  2018 [employer]    Tremor A tremor is trembling or shaking that you cannot control. Most tremors affect the hands or arms. Tremors can also affect the head, vocal cords, face, and other parts of the body. There are many types of tremors. Common types include:  Essential tremor. These usually occur in people over the age of 23. It may run in families and can happen in otherwise healthy people.  Resting tremor. These occur when the muscles are at rest, such as when your hands are resting in your lap. People with Parkinson disease often have resting tremors.  Postural tremor. These occur when you try to hold a pose, such as keeping your hands outstretched.  Kinetic tremor. These occur during purposeful movement, such as trying to touch a finger to your nose.  Task-specific tremor. These may occur when you perform tasks such as handwriting, speaking, or standing.  Psychogenic tremor. These dramatically lessen or disappear when you are distracted. They can happen in  people of all ages.  Some types of tremors have no known cause. Tremors can also be a symptom of nervous system problems (neurological disorders) that may occur with aging. Some tremors go away with treatment while others do not. Follow these instructions at home: Watch your tremor for any changes. The following actions may help to lessen any discomfort you are feeling:  Take medicines only as directed by your health care provider.  Limit alcohol intake to no more than 1 drink per day for nonpregnant women and 2 drinks per day for men. One drink equals 12 oz of beer, 5 oz of wine, or 1 oz of hard liquor.  Do not use any tobacco products, including cigarettes, chewing tobacco, or electronic cigarettes. If you need help quitting, ask your health care provider.  Avoid extreme heat or cold.  Limit the amount of caffeine you consumeas directed by your health care provider.  Try to get 8 hours of sleep each night.  Find ways to manage your stress, such as meditation or yoga.  Keep all follow-up visits as directed by your health care provider. This is important.  Contact a health care provider if:  You start having a tremor after starting a new medicine.  You have tremor with other symptoms such as: ? Numbness. ? Tingling. ?  Pain. ? Weakness.  Your tremor gets worse.  Your tremor interferes with your day-to-day life. This information is not intended to replace advice given to you by your health care provider. Make sure you discuss any questions you have with your health care provider. Document Released: 02/07/2002 Document Revised: 10/21/2015 Document Reviewed: 08/15/2013 Elsevier Interactive Patient Education  [employer].

## 2017-11-02 ENCOUNTER — Encounter: Payer: Self-pay | Admitting: Neurology

## 2017-11-11 ENCOUNTER — Telehealth: Payer: Self-pay | Admitting: *Deleted

## 2017-11-11 NOTE — Telephone Encounter (Signed)
Received a medication transportation exception form from [employer]. Pt's mother Nicholos Johns was unaware of this form and stated it was not needed to her knowledge. Since the form was marked urgent, RN called and spoke with Fontaine No @ [employer] who faxed it here. She stated that the mother is getting reimbursement for bringing pt to the appointment since [place]. The mother had put her travels to [employer] down for reimbursement. This form is needed simply for verification that this was correct. She stated that the form doesn't even need to be filled out, just signed. Called pt's mother and LVM asking for call back to let her know. Form ready for Dr. Trevor Mace signature.

## 2017-11-12 NOTE — Telephone Encounter (Signed)
Form signed by Dr. Lucia GaskinsAhern and [facility]. Pt's mother Nicholos JohnsKathleen aware of need for form (reimbursement) and verbalized appreciation for the signed form return to [employer].   Received a receipt of confirmation.

## 2018-01-25 ENCOUNTER — Ambulatory Visit (INDEPENDENT_AMBULATORY_CARE_PROVIDER_SITE_OTHER): Payer: Medicaid Other | Admitting: Podiatry

## 2018-01-25 ENCOUNTER — Encounter: Payer: Self-pay | Admitting: *Deleted

## 2018-01-25 DIAGNOSIS — M79676 Pain in unspecified toe(s): Secondary | ICD-10-CM

## 2018-01-25 DIAGNOSIS — L600 Ingrowing nail: Secondary | ICD-10-CM

## 2018-01-25 DIAGNOSIS — L603 Nail dystrophy: Secondary | ICD-10-CM

## 2018-01-25 MED ORDER — NEOMYCIN-POLYMYXIN-HC 3.5-10000-1 OT SOLN: OTIC | 0 refills | Status: DC

## 2018-01-25 NOTE — Progress Notes (Signed)
Subjective:  Patient ID: Betty Rogers, female    DOB: 11/24/1995,  MRN: [id number]  Chief Complaint  Patient presents with  . Nail Problem    R hallux lateral border x 3 wks; 6/10 intermittent pain -worst after taking showers -pt denies drainage/redness -w/ light swelling Tx: none   22 y.o. female presents with the above complaint. History above confirmed with patient.  Review of Systems: Negative except as noted in the HPI. Denies N/V/F/Ch.  Past Medical History:  Diagnosis Date  . Anxiety and depression   . Migraines   . Panic attacks     Current Outpatient Medications:  .  busPIRone (BUSPAR) 7.5 MG tablet, Take 1 tablet (7.5 mg total) by mouth 3 (three) times daily., Disp: 30 tablet, Rfl: 0 .  butalbital-acetaminophen-caffeine (FIORICET, ESGIC) 50-325-40 MG tablet, Take 1-2 tablets by mouth every 4 (four) hours as needed for headache. No more than 6 per day, Disp: , Rfl:  .  divalproex (DEPAKOTE ER) 250 MG 24 hr tablet, Take 3 tablets (750 mg total) by mouth daily with supper., Disp: 90 tablet, Rfl: 0 .  Erenumab-aooe (AIMOVIG, 140 MG DOSE,) 70 MG/ML SOAJ, Inject 140 mg into the skin every 30 (thirty) days., Disp: , Rfl:  .  FLUoxetine (PROZAC) 20 MG capsule, Take 1 capsule (20 mg total) by mouth daily., Disp: 30 capsule, Rfl: 0 .  hydrOXYzine (ATARAX/VISTARIL) 25 MG tablet, Take 1 tablet (25 mg total) by mouth every 4 (four) hours as needed for anxiety., Disp: 30 tablet, Rfl: 0 .  LORazepam (ATIVAN) 1 MG tablet, Take 1 mg by mouth 2 (two) times daily., Disp: , Rfl:  .  medroxyPROGESTERone Acetate (DEPO-PROVERA IM), Inject into the muscle every 3 (three) months., Disp: , Rfl:  .  prochlorperazine (COMPAZINE) 10 MG tablet, Take 10 mg by mouth 3 (three) times daily., Disp: , Rfl:  .  propranolol (INDERAL) 20 MG tablet, Take 1 tablet (20 mg total) by mouth 2 (two) times daily., Disp: 60 tablet, Rfl: 11 .  QUEtiapine (SEROQUEL) 300 MG tablet, Take 1 tablet (300 mg total) by mouth at  bedtime., Disp: 30 tablet, Rfl: 0 .  SUMAtriptan (IMITREX) 100 MG tablet, Take 1 tablet (100 mg total) by mouth every 2 (two) hours as needed for migraine or headache. May repeat in 2 hours if headache persists or recurs., Disp: 10 tablet, Rfl: 0 .  neomycin-polymyxin-hydrocortisone (CORTISPORIN) OTIC solution, Apply 2-3 drops to the ingrown toenail site twice daily. Cover with band-aid., Disp: 10 mL, Rfl: 0  Social History   Tobacco Use  Smoking Status Never Smoker  Smokeless Tobacco Never Used    Allergies  Allergen Reactions  . Amitriptyline     Worse headaches  . Lac Bovis   . Latex Rash  . Nickel Rash  . Other Itching    Metal    Objective:  There were no vitals filed for this visit. There is no height or weight on file to calculate BMI. Constitutional Well developed. Well nourished.  Vascular Dorsalis pedis pulses palpable bilaterally. Posterior tibial pulses palpable bilaterally. Capillary refill normal to all digits.  No cyanosis or clubbing noted. Pedal hair growth normal.  Neurologic Normal speech. Oriented to person, place, and time. Epicritic sensation to light touch grossly present bilaterally.  Dermatologic Painful ingrowing nail at lateral nail borders of the hallux nail right. No other open wounds. No skin lesions.  Orthopedic: Normal joint ROM without pain or crepitus bilaterally. No visible deformities. No bony tenderness.   Radiographs:  None Assessment:   1. Ingrown nail   2. Pain around toenail   3. Nail dystrophy    Plan:  Patient was evaluated and treated and all questions answered.  Ingrown Nail, right -Patient elects to proceed with minor surgery to remove ingrown toenail removal today. Consent reviewed and signed by patient. -Ingrown nail excised. See procedure note. -Educated on post-procedure care including soaking. Written instructions provided and reviewed. -Patient to follow up in 2 weeks for nail check. -Rx  cortisporin  Procedure: Excision of Ingrown Toenail Location: Right 1st lateral nail borders. Anesthesia: Lidocaine 1% plain; 1.5 mL and Marcaine 0.5% plain; 1.5 mL, digital block. Skin Prep: Betadine. Dressing: Silvadene; telfa; dry, sterile, compression dressing. Technique: Following skin prep, the toe was exsanguinated and a tourniquet was secured at the base of the toe. The affected nail border was freed, split with a nail splitter, and excised. Chemical matrixectomy was then performed with phenol and irrigated out with alcohol. The tourniquet was then removed and sterile dressing applied. Disposition: Patient tolerated procedure well. Patient to return in 2 weeks for follow-up.   Return in about 2 weeks (around 02/08/2018) for Nail Check.

## 2018-01-25 NOTE — Patient Instructions (Signed)

## 2018-02-08 ENCOUNTER — Ambulatory Visit (INDEPENDENT_AMBULATORY_CARE_PROVIDER_SITE_OTHER): Payer: Medicaid Other | Admitting: Podiatry

## 2018-02-08 ENCOUNTER — Encounter: Payer: Self-pay | Admitting: *Deleted

## 2018-02-08 DIAGNOSIS — L600 Ingrowing nail: Secondary | ICD-10-CM

## 2018-02-08 NOTE — Progress Notes (Signed)
  Subjective:  Patient ID: Betty Rogers, female    DOB: 01/01/1996,  MRN: [id number]  Chief Complaint  Patient presents with  . nail check    F/U R hallux nail check Pt. states," no pain, it's doing fine." Tx: epsom and coritcosporin -pt deneis N/V/F/Ch/drainage/swelling/redness   22 y.o. female returns for the above complaint.   Objective:   General AA&O x3. Normal mood and affect.  Vascular Foot warm and well perfused with good capillary refill.  Neurologic Sensation grossly intact.  Dermatologic Nail avulsion site healing well without drainage or erythema. Nail bed with overlying soft crust. Left intact. No signs of local infection.  Orthopedic: No tenderness to palpation of the toe.   Assessment & Plan:  Patient was evaluated and treated and all questions answered.  S/p Ingrown Toenail Excision, right -Healing well without issue. -Discussed return precautions. -F/u PRN

## 2018-07-09 ENCOUNTER — Telehealth: Payer: Self-pay | Admitting: Neurology

## 2018-07-09 NOTE — Telephone Encounter (Signed)
07-09-18 Pt mother has called and she gave verbal consent for a VV doxy.me to be filled to insurance. Pt mother's email is:[email or web address]  Pt understands that although there may be some limitations with this type of visit, we will take all precautions to reduce any security or privacy concerns.  Pt understands that this will be treated like an in office visit and we will file with pt's insurance, and there may be a patient responsible charge related to this service.

## 2018-07-11 NOTE — Progress Notes (Signed)
[facility]    Provider:  Dr Lucia Gaskins Referring Provider: Blane Ohara, MD Primary Care Physician:  Blane Ohara, MD  CC:  Migraines  Virtual Visit via Video Note  I connected with Betty Rogers on 07/12/18 at 11:00 AM EDT by a video enabled telemedicine application and verified that I am speaking with the correct person using two identifiers.  Location: Patient: Home Provider: Office   I discussed the limitations of evaluation and management by telemedicine and the availability of in person appointments. The patient expressed understanding and agreed to proceed.  Follow Up Instructions:    I discussed the assessment and treatment plan with the patient. The patient was provided an opportunity to ask questions and all were answered. The patient agreed with the plan and demonstrated an understanding of the instructions.   The patient was advised to call back or seek an in-person evaluation if the symptoms worsen or if the condition fails to improve as anticipated.  I provided 25 minutes of non-face-to-face time during this encounter.   Anson Fret, MD    Interval history 07/12/2018: Fantastic response to the Aimovig, last headache last month last migraine months ago. Continue the Aimovig. More shaking, hands and feet. Husband has shaking, more in the hands. No side effects to the propranol. Will increase propranolol to once at night. Discussed medications she is on can cause tremor but may also be a component of essential tremor. Difficulty with handwriting and eating.   Interval history 10/26/2017:  We started propranolol, she has 15 headaches days a month and she feels improved on the propranolol but still having 8 migraine days a month. But she feels the Aimovig is helping so much she is doing well. Lately she has been having tremors, in the hands when she picks up things. She is very shaky. TSH normal. Tremors started 2 months ago.    HPI:  Betty Rogers is a 23 y.o. female here as a referral from Dr. Sedalia Muta for migraines. PMHx anxiety, depression, psychosis, panic attacks, migraine, autism spectrum d/o.  She is on the Aimovig autoinjector once a month. Patient is here with migraines who provide mot information. Started 5 years ago. Mother with migraines. Theys tart in the back of the head, can be unilateral, sharp and throbbing, +light and +sound sensitivity, +nausea, no vomiting. Sometimes she gets blurry an it is hard for her to read. She has ringing in her ears. Has  A lot of neck pain and tightness in her. Daily continuous headaches for over 6 months. The migraines last all day 24 hours. They start right before bed, can be positionally worse. 1/2 the month is migrainous. No medication overuse. She is on Aimovig monthly. She is on  monthly. Sleep apnea test was negative.   Meds tried: Topamax, Seroquel, Prozac, Depakote, Vistaril, Ativan, buspar, fioricet, compazine, imitrex, Zofran, propranolol  Reviewed notes, labs and imaging from outside physicians, which showed:  MRI brain 07/22/2017: showed No acute intracranial abnormalities including mass lesion or mass effect, hydrocephalus, extra-axial fluid collection, midline shift, hemorrhage, or acute infarction, large ischemic events (personally reviewed images)  Tsh,hgba1c unremarkable   Review of Systems: Patient complains of symptoms per HPI as well as the following symptoms: Blurred vision, weight gain, chest pain, confusion, headache, weakness, sleepiness, dizziness, depression, anxiety, too much sleep, not enough sleep, decreased energy, change in appetite, disinterest in activities, hallucinations and racing thoughts. Pertinent negatives and positives per HPI. All others negative.   Social History   Socioeconomic  History  . Marital status: Single    Spouse name: Not on file  . Number of children: Not on file  . Years of education: Not on file  . Highest education level: High  school graduate  Occupational History  . Not on file  Social Needs  . Financial resource strain: Not on file  . Food insecurity:    Worry: Not on file    Inability: Not on file  . Transportation needs:    Medical: Not on file    Non-medical: Not on file  Tobacco Use  . Smoking status: Never Smoker  . Smokeless tobacco: Never Used  Substance and Sexual Activity  . Alcohol use: Yes    Frequency: Never    Comment: "a half a wine cooler occasionally"  . Drug use: Never  . Sexual activity: Not Currently    Birth control/protection: Abstinence, Injection  Lifestyle  . Physical activity:    Days per week: Not on file    Minutes per session: Not on file  . Stress: Not on file  Relationships  . Social connections:    Talks on phone: Not on file    Gets together: Not on file    Attends religious service: Not on file    Active member of club or organization: Not on file    Attends meetings of clubs or organizations: Not on file    Relationship status: Not on file  . Intimate partner violence:    Fear of current or ex partner: Not on file    Emotionally abused: Not on file    Physically abused: Not on file    Forced sexual activity: Not on file  Other Topics Concern  . Not on file  Social History Narrative   Lives at home with her parents   Right handed   No caffeine    Family History  Problem Relation Age of Onset  . Heart attack Other   . Cancer Other     Past Medical History:  Diagnosis Date  . Anxiety and depression   . Migraines   . Panic attacks     Past Surgical History:  Procedure Laterality Date  . CHOLECYSTECTOMY    . CHOLECYSTECTOMY    . REMOVAL BIRTH MARK ON HEAD   2008    Current Outpatient Medications  Medication Sig Dispense Refill  . ARIPiprazole (ABILIFY) 15 MG tablet Take 15 mg by mouth at bedtime.    . busPIRone (BUSPAR) 10 MG tablet Take 10 mg by mouth 2 (two) times daily.    . butalbital-acetaminophen-caffeine (FIORICET, ESGIC) 50-325-40  MG tablet Take 1-2 tablets by mouth every 4 (four) hours as needed for headache. No more than 6 per day    . dicyclomine (BENTYL) 20 MG tablet Take 20 mg by mouth 4 (four) times daily -  before meals and at bedtime.    . divalproex (DEPAKOTE ER) 500 MG 24 hr tablet Take 500 mg by mouth daily.    Dorise Hiss. Erenumab-aooe (AIMOVIG) 140 MG/ML SOAJ Inject 140 mg into the skin every 30 (thirty) days. 1 pen 11  . FLUoxetine (PROZAC) 20 MG capsule Take 1 capsule (20 mg total) by mouth daily. 30 capsule 0  . hydrOXYzine (VISTARIL) 25 MG capsule Take 25 mg by mouth at bedtime.    Marland Kitchen. LORazepam (ATIVAN) 1 MG tablet Take 1 mg by mouth 2 (two) times daily.    . medroxyPROGESTERone Acetate (DEPO-PROVERA IM) Inject into the muscle every 3 (three) months.    .Marland Kitchen  prochlorperazine (COMPAZINE) 10 MG tablet Take 10 mg by mouth 3 (three) times daily.    . propranolol ER (INDERAL LA) 80 MG 24 hr capsule Take 1 capsule (80 mg total) by mouth at bedtime. 30 capsule 6  . SUMAtriptan (IMITREX) 100 MG tablet Take 1 tablet (100 mg total) by mouth every 2 (two) hours as needed for migraine or headache. May repeat in 2 hours if headache persists or recurs. 10 tablet 0  . VITAMIN D PO Take 125 mcg by mouth daily.     No current facility-administered medications for this visit.     Allergies as of 07/12/2018 - Review Complete 07/12/2018  Allergen Reaction Noted  . Amitriptyline  11/15/2015  . Lac bovis  04/28/2017  . Latex Rash 07/28/2017  . Nickel Rash 07/18/2017  . Other Itching 07/18/2017    Vitals: There were no vitals taken for this visit. Last Weight:  Wt Readings from Last 1 Encounters:  10/26/17 179 lb (81.2 kg)   Last Height:   Ht Readings from Last 1 Encounters:  10/26/17  (1.626 m)   Physical exam: Exam: Gen: NAD, conversant, well nourised, obese, well groomed                     CV: RRR, no MRG. No Carotid Bruits. No peripheral edema, warm, nontender Eyes: Conjunctivae clear without exudates or  hemorrhage  Neuro: Detailed Neurologic Exam  Speech:    Speech is normal; fluent and spontaneous with normal comprehension.  Cognition:    The patient is oriented to person, place, and time;     recent and remote memory intact;     language fluent;     normal attention, concentration,     fund of knowledge Cranial Nerves:    The pupils are equal, round, and reactive to light. The fundi are normal and spontaneous venous pulsations are present. Visual fields are full to finger confrontation. Extraocular movements are intact. Trigeminal sensation is intact and the muscles of mastication are normal. The face is symmetric. The palate elevates in the midline. Hearing intact. Voice is normal. Shoulder shrug is normal. The tongue has normal motion without fasciculations.   Coordination:    Normal finger to nose and heel to shin. Normal rapid alternating movements.   Gait:    Heel-toe and tandem gait are normal.   Motor Observation:    No asymmetry, no atrophy. High frequency, low amplitude postural and action tremor.  Tone:    Normal muscle tone.    Posture:    Posture is normal. normal erect    Strength:    Strength is V/V in the upper and lower limbs.      Sensation: intact to LT     Reflex Exam:  DTR's:    Deep tendon reflexes in the upper and lower extremities are normal bilaterally.   Toes:    The toes are downgoing bilaterally.   Clonus:    Clonus is absent.   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   Tremors: May be due to medications, also has a family history in father may be a component of essential tremor as well. She is on Depakote which is very common for tremor. Try increasing propranolol. Work with Tamela Oddi to see if Depakote can be decreased.  Hypersomnia: Discussed good sleep hygiene. She has poor sleep habits such as  she sleeps in the day and also has mood diagnoses that may be contributory. No cataplexy,  or other features of  narcolepsy. Discussed exercise. No snoring. No signs/symptoms of OSA had testing in the past.   Discuss with pcp and see if tsh, cmp and cbc have been checked and depakote levels. Advised mother to discuss with primary care and ensure these completed.  Continue Aimovig for migraines doing exceptionally well  Meds ordered this encounter  Medications  . propranolol ER (INDERAL LA) 80 MG 24 hr capsule    Sig: Take 1 capsule (80 mg total) by mouth at bedtime.    Dispense:  30 capsule    Refill:  6  . Erenumab-aooe (AIMOVIG) 140 MG/ML SOAJ    Sig: Inject 140 mg into the skin every 30 (thirty) days.    Dispense:  1 pen    Refill:  11   Discussed: To prevent or relieve headaches, try the following: Cool Compress. Lie down and place a cool compress on your head.  Avoid headache triggers. If certain foods or odors seem to have triggered your migraines in the past, avoid them. A headache diary might help you identify triggers.  Include physical activity in your daily routine. Try a daily walk or other moderate aerobic exercise.  Manage stress. Find healthy ways to cope with the stressors, such as delegating tasks on your to-do list.  Practice relaxation techniques. Try deep breathing, yoga, massage and visualization.  Eat regularly. Eating regularly scheduled meals and maintaining a healthy diet might help prevent headaches. Also, drink plenty of fluids.  Follow a regular sleep schedule. Sleep deprivation might contribute to headaches Consider biofeedback. With this mind-body technique, you learn to control certain bodily functions - such as muscle tension, heart rate and blood pressure - to prevent headaches or reduce headache pain.    Proceed to emergency room if you experience new or worsening symptoms or symptoms do not resolve, if you have new neurologic symptoms or if headache is severe, or for any concerning symptom.   Provided education and documentation from American headache Society  toolbox including articles on: chronic migraine medication overuse headache, chronic migraines, prevention of migraines, behavioral and other nonpharmacologic treatments for headache.    Naomie Dean, MD  [facility] [address] [place], [place] [address]  Phone [phone] Fax [phone]  A total of 25 minutes was spent face-to-face with this patient. Over half this time was spent on counseling patient on the  1. Chronic migraine without aura, with intractable migraine, so stated, with status migrainosus   2. Tremor   3. Hypersomnolence    Diagnosis and different diagnostic and therapeutic options, counseling and coordination of care, risks ans benefits of management, compliance, or risk factor reduction and education.

## 2018-07-12 ENCOUNTER — Encounter: Payer: Self-pay | Admitting: Neurology

## 2018-07-12 ENCOUNTER — Other Ambulatory Visit: Payer: Self-pay

## 2018-07-12 ENCOUNTER — Ambulatory Visit (INDEPENDENT_AMBULATORY_CARE_PROVIDER_SITE_OTHER): Payer: Medicaid Other | Admitting: Neurology

## 2018-07-12 DIAGNOSIS — R251 Tremor, unspecified: Secondary | ICD-10-CM | POA: Insufficient documentation

## 2018-07-12 DIAGNOSIS — G4710 Hypersomnia, unspecified: Secondary | ICD-10-CM

## 2018-07-12 DIAGNOSIS — G43711 Chronic migraine without aura, intractable, with status migrainosus: Secondary | ICD-10-CM | POA: Diagnosis not present

## 2018-07-12 MED ORDER — PROPRANOLOL HCL ER 80 MG PO CP24: 80.0000 mg | ORAL_CAPSULE | Freq: Every day | ORAL | 6 refills | Status: DC

## 2018-07-12 MED ORDER — ERENUMAB-AOOE 140 MG/ML ~~LOC~~ SOAJ: 140.0000 mg | SUBCUTANEOUS | 11 refills | Status: DC

## 2018-07-12 NOTE — Telephone Encounter (Signed)
Called pt's mother Olegario Messier (on [place]) and LVM asking for call back to update pt's chart before her virtual visit with Dr. Lucia Gaskins. Left office number in message.

## 2018-07-12 NOTE — Telephone Encounter (Signed)
Patient's mother Olegario Messier returning your call. Please contact her back at [phone]

## 2018-07-12 NOTE — Addendum Note (Signed)
Addended by: Bertram Savin on: 07/12/2018 10:51 AM   Modules accepted: Orders

## 2018-07-12 NOTE — Telephone Encounter (Signed)
email with doxy link & appt date/time sent to [email or web address].

## 2018-07-12 NOTE — Telephone Encounter (Signed)
I called kathy back. She was not available. Asked for cb in about 5 minutes.

## 2018-07-12 NOTE — Telephone Encounter (Signed)
Spoke with pt's mother Olegario Messier. Confirmed pt using 2 identifiers. Completed chart review. No changes to PMH. Meds updated. She verbalized appreciation for the call.

## 2018-07-15 ENCOUNTER — Telehealth: Payer: Self-pay | Admitting: *Deleted

## 2018-07-15 NOTE — Telephone Encounter (Signed)
Noted  

## 2018-07-15 NOTE — Telephone Encounter (Signed)
Per 07/12/2018 visit, return in 1 year. I called pt's mother Olegario Messier (on [place]-legal guardian) and LVM asking for call back to schedule pt's f/u for May 2021. Left office number in message.

## 2018-07-15 NOTE — Telephone Encounter (Signed)
Pt mother has called back to inform she will decline scheduling the 1 year at this time.  Mother is sure pt will be seen again before May of 2021.  No call back has been requested.

## 2018-09-08 ENCOUNTER — Other Ambulatory Visit: Payer: Self-pay | Admitting: Neurology

## 2018-09-08 NOTE — Telephone Encounter (Signed)
Patients mother called and stated that she thought the patients Inderal was supposed to be reduced but it doesn't appear it has been. She is confused because she was sure that it was discussed a medication was going to be reduced but now she cant remember which one it was. Please call and advise at [phone].

## 2018-09-09 NOTE — Telephone Encounter (Signed)
Called pt's mother Nunzio Cory back and discussed the change in Propranolol at the last VV May 2020 was to change from 20 mg BID to 80 mg ER at bedtime. She did not recall this change but she is aware the prescription had been sent to [place] in May and she will call them. Also reiterated for pt to work with Eino Farber about possibility of decreasing Depakote. She understands to call office if any questions/concerns after the change in Propranolol, proceed to ED for any emergencies. She understands this medication can decrease BP & HR. She verbalized appreciation for the call.

## 2018-11-22 ENCOUNTER — Other Ambulatory Visit: Payer: Self-pay | Admitting: Neurology

## 2018-11-25 ENCOUNTER — Telehealth: Payer: Self-pay | Admitting: Neurology

## 2018-11-25 ENCOUNTER — Other Ambulatory Visit: Payer: Self-pay | Admitting: Neurology

## 2018-11-25 NOTE — Telephone Encounter (Signed)
I spoke with the pt's mother and discussed that in May the Propranolol was changed from 20 mg BID to 80 mg once daily at bedtime. She verbalized understanding of the clarification and checked the pt's bottles. She realized she had been giving her both the 20 mg twice daily and then also the 80 mg at bedtime. She stated pt seemed to be doing fine and she will stop the 20 mg BID. She verbalized appreciation for the call.

## 2018-11-25 NOTE — Telephone Encounter (Signed)
Pt's mother called wanting to know why the pt's propranolol ER (INDERAL LA) 80 MG 24 hr capsule was not filled yet. Pt is on her last day. Pt's mother would like to speak to RN because she was under the impression the mg were 20 mg not 80mg . Please advise.

## 2018-12-11 IMAGING — MR MR HEAD W/O CM
9 of 11 series · 33 of 48 positions shown · non-contrast
Comparison: None.

CLINICAL DATA: Chronic headache, normal exam

EXAM:
MRI HEAD WITHOUT CONTRAST
TECHNIQUE: Multiplanar, multiecho pulse sequences of the brain and surrounding
structures were obtained without intravenous contrast.

[Series 3: DWI · axial · 3.0mm · 0.94mm/px · z∈[-80,+74]mm · 8 of 108 slices shown (1 of 2)]
[im 1/108]
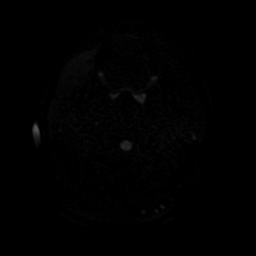
[im 16/108]
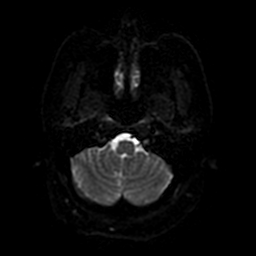
[im 31/108]
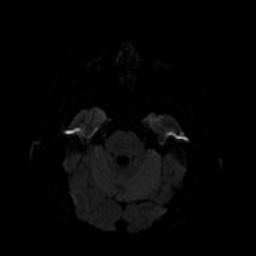
[im 46/108]
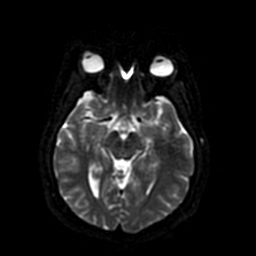
[im 62/108]
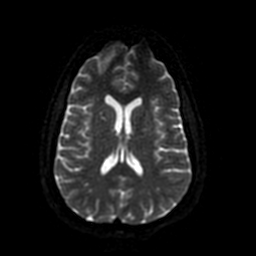
[im 77/108]
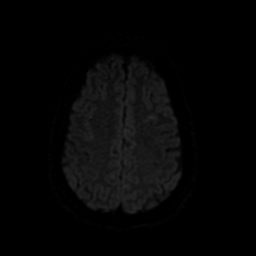
[im 92/108]
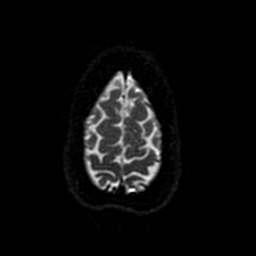
[im 108/108]
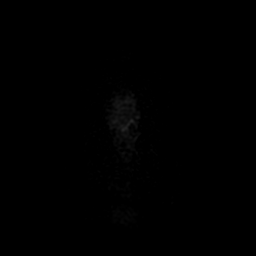

[Series 4: DWI · coronal · 4.0mm · 0.94mm/px · 5 of 75 slices shown (2 of 2)]
[im 1/75]
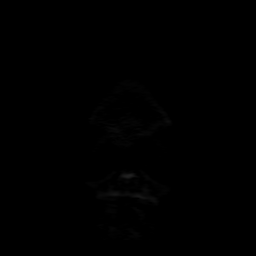
[im 19/75]
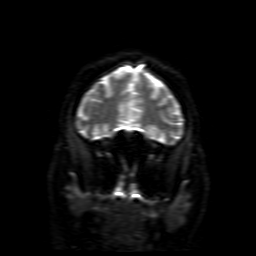
[im 38/75]
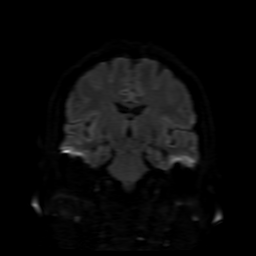
[im 56/75]
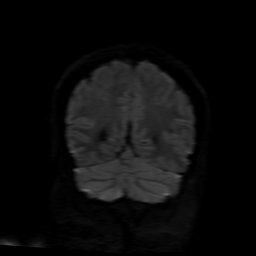
[im 75/75]
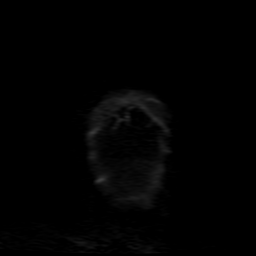

[Series 5: FLAIR · sagittal · 5.0mm · 0.47mm/px · 2 of 25 slices shown (1 of 2)]
[im 1/25]
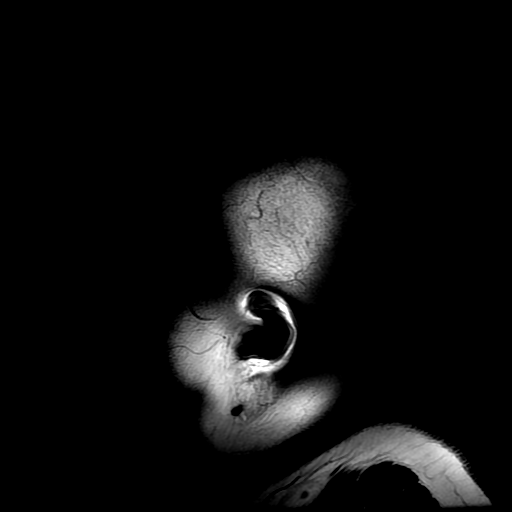
[im 25/25]
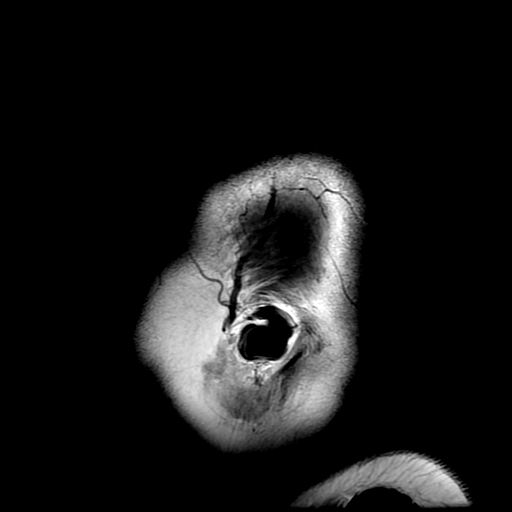

[Series 7: T2 · axial · 5.0mm · 0.47mm/px · z∈[-94,+77]mm · 2 of 30 slices shown (1 of 2)]
[im 1/30]
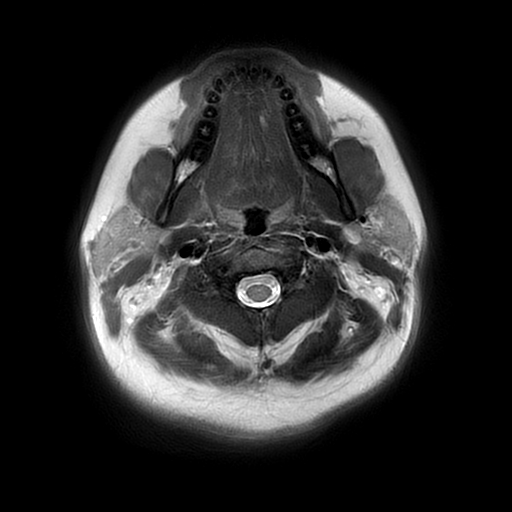
[im 30/30]
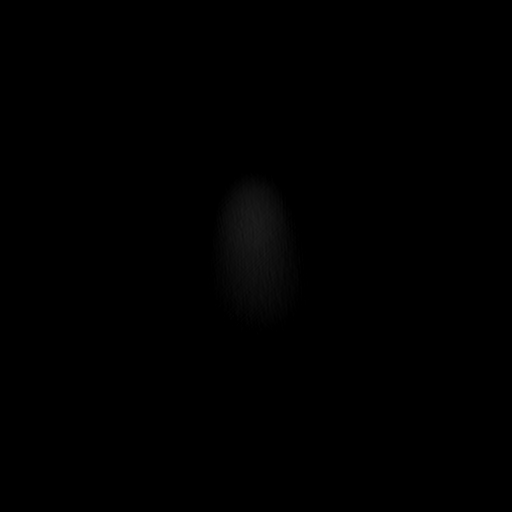

[Series 8: FLAIR · axial · 3.0mm · 0.45mm/px · z∈[-80,+77]mm · 2 of 28 slices shown (2 of 2)]
[im 1/28]
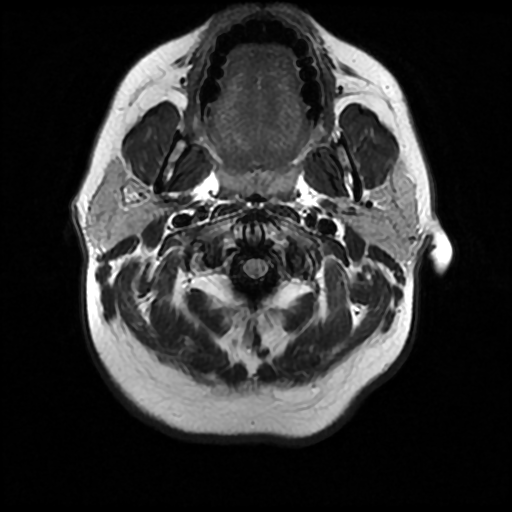
[im 28/28]
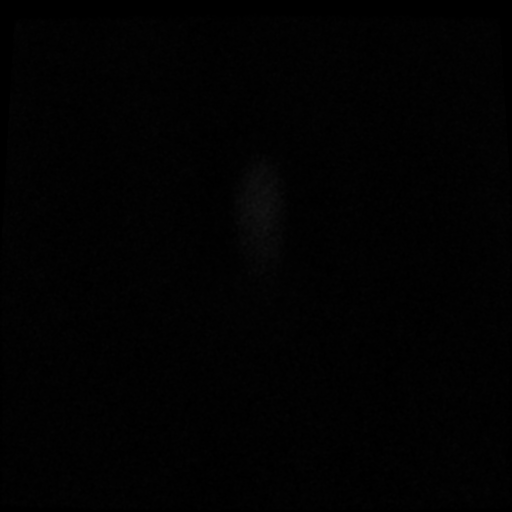

[Series 9: (person_name) · axial · 3.0mm · 0.47mm/px · z∈[-76,+14]mm · 5 of 108 slices shown]
[im 1/108]
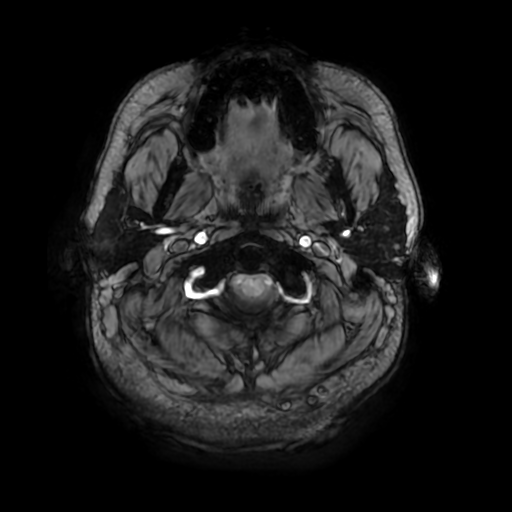
[im 16/108]
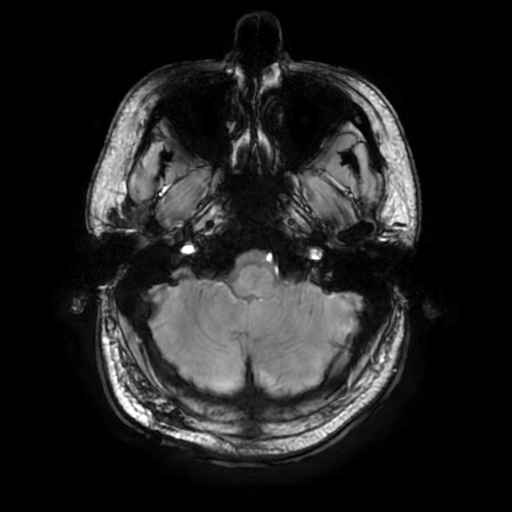
[im 31/108]
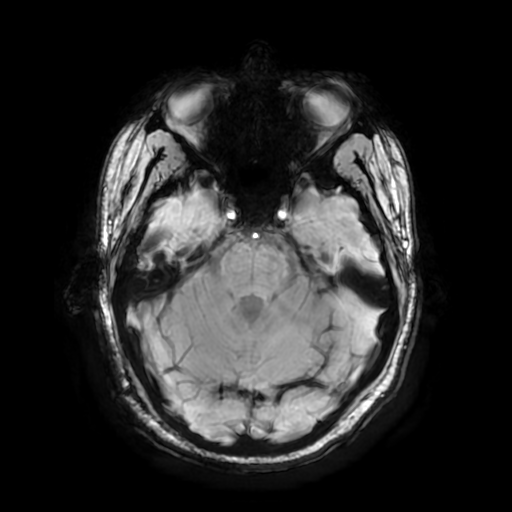
[im 46/108]
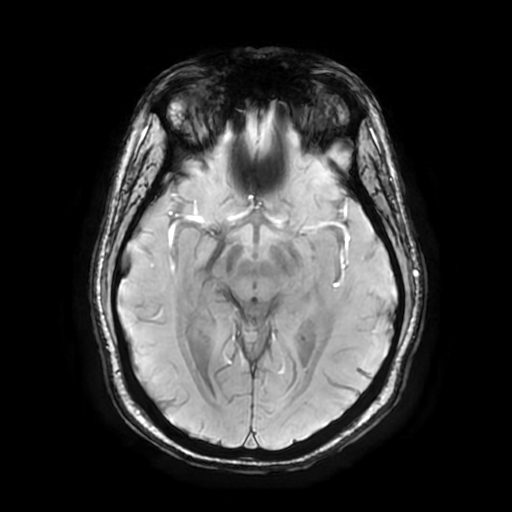
[im 62/108]
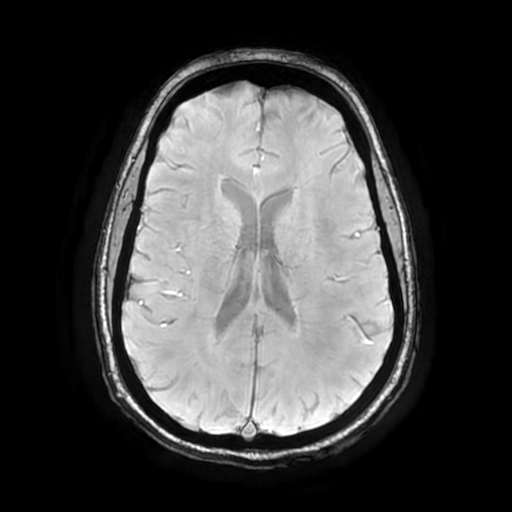

[Series 11: T2 · coronal · 5.0mm · 0.39mm/px · 2 of 32 slices shown (2 of 2)]
[im 1/32]
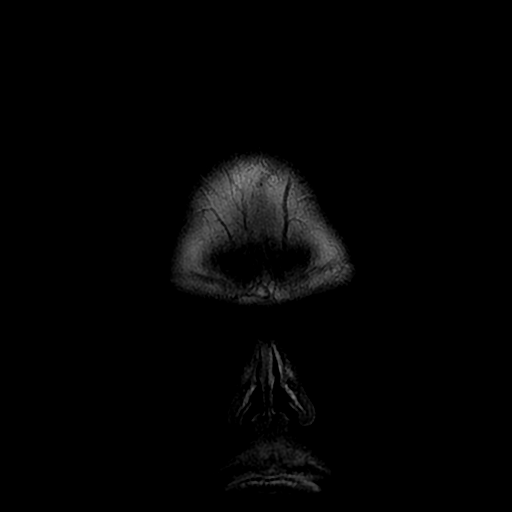
[im 32/32]
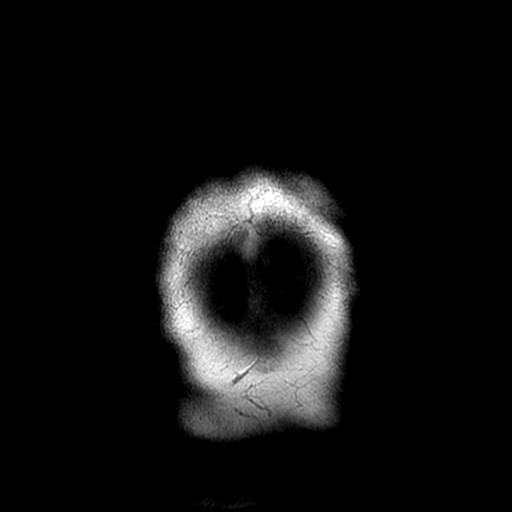

[Series 350: ADC · axial · 3.0mm · 0.94mm/px · z∈[-80,+74]mm · 4 of 54 slices shown (1 of 2)]
[im 1/54]
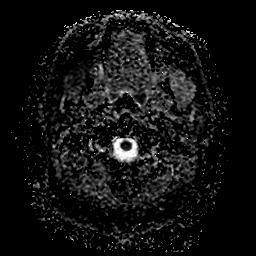
[im 18/54]
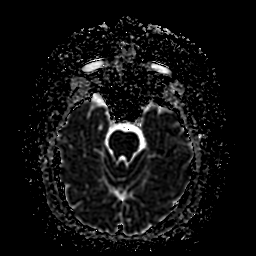
[im 36/54]
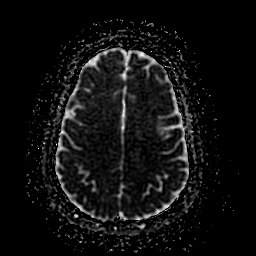
[im 54/54]
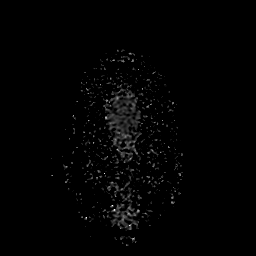

[Series 450: ADC · coronal · 4.0mm · 0.94mm/px · 3 of 38 slices shown (2 of 2)]
[im 1/38]
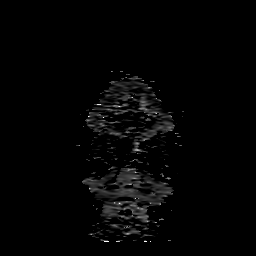
[im 19/38]
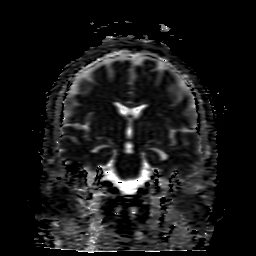
[im 38/38]
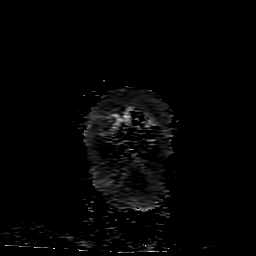

[33 of 48 positions shown; findings below may reference images not displayed]

FINDINGS: Brain: No acute infarction, hemorrhage, hydrocephalus, extra-axial
collection or mass lesion. Negative for Chiari malformation.

Vascular: Normal arterial flow voids

Skull and upper cervical spine: Negative

Sinuses/Orbits: Negative

Other: None
IMPRESSION: Negative MRI head.

## 2018-12-18 IMAGING — XA DG FLUORO GUIDE LUMBAR PUNCTURE
2 series · 2 of 2 positions shown · non-contrast
Comparison: none

CLINICAL DATA: Headaches.

[Series 2: ortho adipose · 1 of 1 slices shown (1 of 2)]
[im 1/1]
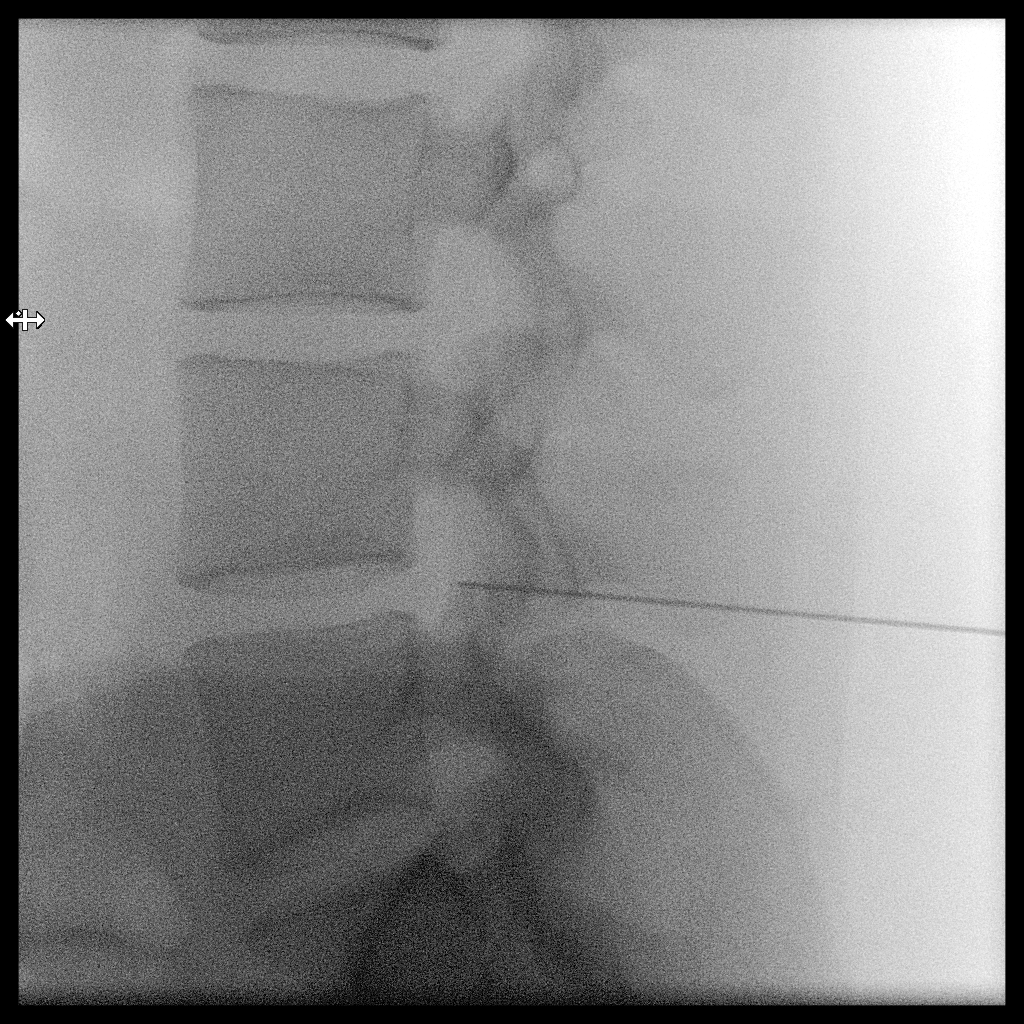

[Series 3: ortho adipose · 1 of 1 slices shown (2 of 2)]
[im 1/1]
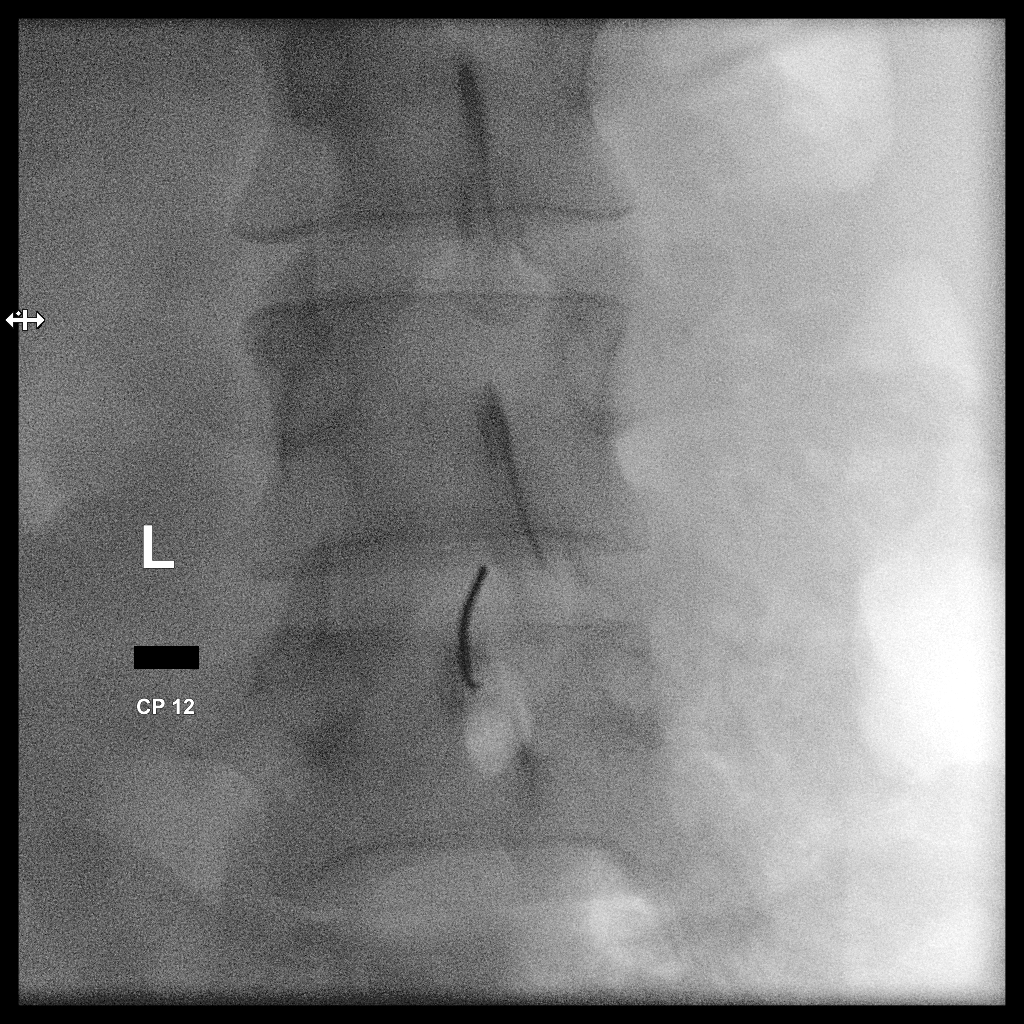

[2 of 2 positions shown; findings below may reference images not displayed]

EXAM:
DIAGNOSTIC LUMBAR PUNCTURE UNDER FLUOROSCOPIC GUIDANCE

FLUOROSCOPY TIME:  Radiation Exposure Index (as provided by the
fluoroscopic device): 23 seconds corresponding to a Dose Area
Product of 76.56 Gy*m2

PROCEDURE:
Informed consent was obtained from the patient and her mother prior
to the procedure, including potential complications of headache,
allergy, and pain. With the patient lateral decubitus, the lower
back was prepped with Betadine. 1% Lidocaine was used for local
anesthesia. Lumbar puncture was performed at the L3-L4 level using a
20 gauge needle with return of clear CSF with an opening pressure of
14 cm water. Closing pressure was 12 cm water. 5.0 ml of CSF were
obtained for laboratory studies. The patient tolerated the procedure
well and there were no apparent complications. Instructions were
given for 24 hours bed rest.
IMPRESSION: Technically satisfactory lumbar puncture, normal opening pressure.
Clear colorless fluid.

## 2018-12-20 ENCOUNTER — Encounter

## 2018-12-20 ENCOUNTER — Encounter: Payer: Self-pay | Admitting: Neurology

## 2018-12-20 ENCOUNTER — Ambulatory Visit: Payer: Medicaid Other | Admitting: Neurology

## 2018-12-20 ENCOUNTER — Other Ambulatory Visit: Payer: Self-pay

## 2018-12-20 VITALS — BP 99/65 | HR 85 | Temp 97.2°F | Ht 64.0 in | Wt 178.0 lb

## 2018-12-20 DIAGNOSIS — G4710 Hypersomnia, unspecified: Secondary | ICD-10-CM

## 2018-12-20 DIAGNOSIS — R251 Tremor, unspecified: Secondary | ICD-10-CM

## 2018-12-20 DIAGNOSIS — G43711 Chronic migraine without aura, intractable, with status migrainosus: Secondary | ICD-10-CM

## 2018-12-20 MED ORDER — AJOVY 225 MG/1.5ML ~~LOC~~ SOAJ: 225.0000 mg | SUBCUTANEOUS | 11 refills | Status: DC

## 2018-12-20 MED ORDER — SUMATRIPTAN SUCCINATE 100 MG PO TABS: 100.0000 mg | ORAL_TABLET | ORAL | 11 refills | Status: DC | PRN

## 2018-12-20 MED ORDER — KETOROLAC TROMETHAMINE 60 MG/2ML IM SOLN
60.0000 mg | Freq: Once | INTRAMUSCULAR | Status: AC
  Administered 2018-12-20: 60 mg via INTRAMUSCULAR

## 2018-12-20 MED ORDER — PROPRANOLOL HCL ER 80 MG PO CP24: 80.0000 mg | ORAL_CAPSULE | Freq: Every day | ORAL | 6 refills | Status: DC

## 2018-12-20 NOTE — Addendum Note (Signed)
Addended by: Sarina Ill B on: 12/20/2018 08:54 AM   Modules accepted: Orders

## 2018-12-20 NOTE — Progress Notes (Signed)
Toradol 60 mg IM injection given to pt per v.o. Aseptic technique used. Administered in RUOQ of R buttock. Pt tolerated well. See MAR.

## 2018-12-20 NOTE — Progress Notes (Signed)
[facility]    Provider:  Dr Lucia Gaskins Referring Provider: Blane Ohara, MD Primary Care Physician:  Blane Ohara, MD  CC:  Migraines  Interval history 12/20/2018:   Interval history She is having 3 migraines a month, not doing as well on the Aimovig, discussed immunogenicity will try switching to [place] or Erenumab. Will give a refill for Imitrex.    Interval history 07/12/2018: Fantastic response to the Aimovig, last headache last month last migraine months ago. Continue the Aimovig. More shaking, hands and feet. Husband has shaking, more in the hands. No side effects to the propranol. Will increase propranolol to once at night. Discussed medications she is on can cause tremor but may also be a component of essential tremor. Difficulty with handwriting and eating.   Interval history 10/26/2017:  We started propranolol, she has 15 headaches days a month and she feels improved on the propranolol but still having 8 migraine days a month. But she feels the Aimovig is helping so much she is doing well. Lately she has been having tremors, in the hands when she picks up things. She is very shaky. TSH normal. Tremors started 2 months ago.    HPI:  Betty Rogers is a 23 y.o. female here as a referral from Dr. Sedalia Muta for migraines. PMHx anxiety, depression, psychosis, panic attacks, migraine, autism spectrum d/o.  She is on the Aimovig autoinjector once a month. Patient is here with migraines who provide mot information. Started 5 years ago. Mother with migraines. Theys tart in the back of the head, can be unilateral, sharp and throbbing, +light and +sound sensitivity, +nausea, no vomiting. Sometimes she gets blurry an it is hard for her to read. She has ringing in her ears. Has  A lot of neck pain and tightness in her. Daily continuous headaches for over 6 months. The migraines last all day 24 hours. They start right before bed, can be positionally worse. 1/2 the month is migrainous. No  medication overuse. She is on Aimovig monthly. She is on  monthly. Sleep apnea test was negative.   Meds tried: Topamax, Seroquel, Prozac, Depakote, Vistaril, Ativan, buspar, fioricet, compazine, imitrex, Zofran, propranolol  Reviewed notes, labs and imaging from outside physicians, which showed:  MRI brain 07/22/2017: showed No acute intracranial abnormalities including mass lesion or mass effect, hydrocephalus, extra-axial fluid collection, midline shift, hemorrhage, or acute infarction, large ischemic events (personally reviewed images)  Tsh,hgba1c unremarkable   Review of Systems: Patient complains of symptoms per HPI as well as the following symptoms: Blurred vision, weight gain, chest pain, confusion, headache, weakness, sleepiness, dizziness, depression, anxiety, too much sleep, not enough sleep, decreased energy, change in appetite, disinterest in activities, hallucinations and racing thoughts. Pertinent negatives and positives per HPI. All others negative.   Social History   Socioeconomic History  . Marital status: Single    Spouse name: Not on file  . Number of children: Not on file  . Years of education: Not on file  . Highest education level: High school graduate  Occupational History  . Not on file  Social Needs  . Financial resource strain: Not on file  . Food insecurity    Worry: Not on file    Inability: Not on file  . Transportation needs    Medical: Not on file    Non-medical: Not on file  Tobacco Use  . Smoking status: Never Smoker  . Smokeless tobacco: Never Used  Substance and Sexual Activity  . Alcohol use: Yes  Frequency: Never    Comment: "a half a wine cooler occasionally"  . Drug use: Never  . Sexual activity: Not Currently    Birth control/protection: Abstinence, Injection  Lifestyle  . Physical activity    Days per week: Not on file    Minutes per session: Not on file  . Stress: Not on file  Relationships  . Social Wellsite geologistconnections     Talks on phone: Not on file    Gets together: Not on file    Attends religious service: Not on file    Active member of club or organization: Not on file    Attends meetings of clubs or organizations: Not on file    Relationship status: Not on file  . Intimate partner violence    Fear of current or ex partner: Not on file    Emotionally abused: Not on file    Physically abused: Not on file    Forced sexual activity: Not on file  Other Topics Concern  . Not on file  Social History Narrative   Lives at home with her parents   Right handed   No caffeine    Family History  Problem Relation Age of Onset  . Heart attack Other   . Cancer Other     Past Medical History:  Diagnosis Date  . Anxiety and depression   . Migraines   . Panic attacks     Past Surgical History:  Procedure Laterality Date  . CHOLECYSTECTOMY    . CHOLECYSTECTOMY    . ESOPHAGOGASTRODUODENOSCOPY    . REMOVAL BIRTH MARK ON HEAD   2008    Current Outpatient Medications  Medication Sig Dispense Refill  . ARIPiprazole (ABILIFY) 5 MG tablet Take 5 mg by mouth at bedtime.    . busPIRone (BUSPAR) 10 MG tablet Take 10 mg by mouth 2 (two) times daily.    . butalbital-acetaminophen-caffeine (FIORICET, ESGIC) 50-325-40 MG tablet Take 1-2 tablets by mouth every 4 (four) hours as needed for headache. No more than 6 per day    . dicyclomine (BENTYL) 20 MG tablet Take 10 mg by mouth 4 (four) times daily -  before meals and at bedtime.     . divalproex (DEPAKOTE ER) 250 MG 24 hr tablet Take 250 mg by mouth at bedtime.    Dorise Hiss. Erenumab-aooe (AIMOVIG) 140 MG/ML SOAJ Inject 140 mg into the skin every 30 (thirty) days. 1 pen 11  . FLUoxetine (PROZAC) 20 MG capsule Take 1 capsule (20 mg total) by mouth daily. 30 capsule 0  . hydrOXYzine (VISTARIL) 25 MG capsule Take 25 mg by mouth at bedtime.    Marland Kitchen. LORazepam (ATIVAN) 1 MG tablet Take 1 mg by mouth 2 (two) times daily.    . medroxyPROGESTERone Acetate (DEPO-PROVERA IM) Inject  into the muscle every 3 (three) months.    Marland Kitchen. omeprazole (PRILOSEC) 20 MG capsule Take 20 mg by mouth 2 (two) times daily.    . propranolol ER (INDERAL LA) 80 MG 24 hr capsule Take 1 capsule (80 mg total) by mouth at bedtime. 30 capsule 6  . SUMAtriptan (IMITREX) 100 MG tablet Take 1 tablet (100 mg total) by mouth every 2 (two) hours as needed for migraine or headache. May repeat in 2 hours if headache persists or recurs. 10 tablet 11  . VITAMIN D PO Take 125 mcg by mouth daily.    . Fremanezumab-vfrm (AJOVY) 225 MG/1.5ML SOAJ Inject 225 mg into the skin every 30 (thirty) days. 1 pen 11  .  prochlorperazine (COMPAZINE) 10 MG tablet Take 10 mg by mouth 3 (three) times daily.     No current facility-administered medications for this visit.     Allergies as of 12/20/2018 - Review Complete 12/20/2018  Allergen Reaction Noted  . Amitriptyline  11/15/2015  . Lac bovis  04/28/2017  . Latex Rash 07/28/2017  . Nickel Rash 07/18/2017  . Other Itching 07/18/2017    Vitals: BP 99/65 (BP Location: Right Arm, Patient Position: Sitting)   Pulse 85   Temp (!) 97.2 F (36.2 C) Comment: mother 97.5; both taken at front door  Ht 5\' 4"  (1.626 m)   Wt 178 lb (80.7 kg)   BMI 30.55 kg/m  Last Weight:  Wt Readings from Last 1 Encounters:  12/20/18 178 lb (80.7 kg)   Last Height:   Ht Readings from Last 1 Encounters:  12/20/18 5\' 4"  (1.626 m)   Physical exam: Exam: Gen: NAD, conversant, well nourised, obese, well groomed                     CV: RRR, no MRG. No Carotid Bruits. No peripheral edema, warm, nontender Eyes: Conjunctivae clear without exudates or hemorrhage  Neuro: Detailed Neurologic Exam  Speech:    Speech is normal; fluent and spontaneous with normal comprehension.  Cognition:    The patient is oriented to person, place, and time;     recent and remote memory intact;     language fluent;     normal attention, concentration,     fund of knowledge Cranial Nerves:    The pupils  are equal, round, and reactive to light. The fundi are normal and spontaneous venous pulsations are present. Visual fields are full to finger confrontation. Extraocular movements are intact. Trigeminal sensation is intact and the muscles of mastication are normal. The face is symmetric. The palate elevates in the midline. Hearing intact. Voice is normal. Shoulder shrug is normal. The tongue has normal motion without fasciculations.   Coordination:    Normal finger to nose and heel to shin. Normal rapid alternating movements.   Gait:    Heel-toe and tandem gait are normal.   Motor Observation:    No asymmetry, no atrophy. High frequency, low amplitude postural and action tremor.  Tone:    Normal muscle tone.    Posture:    Posture is normal. normal erect    Strength:    Strength is V/V in the upper and lower limbs.      Sensation: intact to LT     Reflex Exam:  DTR's:    Deep tendon reflexes in the upper and lower extremities are normal bilaterally.   Toes:    The toes are downgoing bilaterally.   Clonus:    Clonus is absent.   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   Migraine; Doing tremendous on CGRP however the aimovig not working as well. Discussed immunogenicity will switch to Ajovy or Emgality. Imitrex acutely.  Tremors: Stable. May be due to medications, also has a family history in father may be a component of essential tremor as well. She is on Depakote which is very common for tremor. Try increasing propranolol. Work with Eino Farber to see if Depakote can be decreased.  Hypersomnia:Stable.  Discussed good sleep hygiene. She has poor sleep habits such as  she sleeps in the day and also has mood diagnoses that may be contributory. No cataplexy, or other features of narcolepsy. Discussed exercise. No snoring. No  signs/symptoms of OSA had testing in the past.   Discuss with pcp and see if tsh, cmp and cbc have been checked and depakote levels.  Advised mother to discuss with primary care and ensure these completed. Discussed again.   Meds ordered this encounter  Medications  . SUMAtriptan (IMITREX) 100 MG tablet    Sig: Take 1 tablet (100 mg total) by mouth every 2 (two) hours as needed for migraine or headache. May repeat in 2 hours if headache persists or recurs.    Dispense:  10 tablet    Refill:  11  . Fremanezumab-vfrm (AJOVY) 225 MG/1.5ML SOAJ    Sig: Inject 225 mg into the skin every 30 (thirty) days.    Dispense:  1 pen    Refill:  11   Discussed: To prevent or relieve headaches, try the following: Cool Compress. Lie down and place a cool compress on your head.  Avoid headache triggers. If certain foods or odors seem to have triggered your migraines in the past, avoid them. A headache diary might help you identify triggers.  Include physical activity in your daily routine. Try a daily walk or other moderate aerobic exercise.  Manage stress. Find healthy ways to cope with the stressors, such as delegating tasks on your to-do list.  Practice relaxation techniques. Try deep breathing, yoga, massage and visualization.  Eat regularly. Eating regularly scheduled meals and maintaining a healthy diet might help prevent headaches. Also, drink plenty of fluids.  Follow a regular sleep schedule. Sleep deprivation might contribute to headaches Consider biofeedback. With this mind-body technique, you learn to control certain bodily functions - such as muscle tension, heart rate and blood pressure - to prevent headaches or reduce headache pain.    Proceed to emergency room if you experience new or worsening symptoms or symptoms do not resolve, if you have new neurologic symptoms or if headache is severe, or for any concerning symptom.   Provided education and documentation from American headache Society toolbox including articles on: chronic migraine medication overuse headache, chronic migraines, prevention of migraines, behavioral  and other nonpharmacologic treatments for headache.    Naomie Dean, MD  [facility] [address] [place], [place] [address]  Phone [phone] Fax [phone]  A total of 15 minutes was spent face-to-face with this patient. Over half this time was spent on counseling patient on the  1. Chronic migraine without aura, with intractable migraine, so stated, with status migrainosus   2. Tremor   3. Hypersomnolence    Diagnosis and different diagnostic and therapeutic options, counseling and coordination of care, risks ans benefits of management, compliance, or risk factor reduction and education.

## 2018-12-20 NOTE — Patient Instructions (Signed)
Fremanezumab injection What is this medicine? FREMANEZUMAB (fre ma NEZ ue mab) is used to prevent migraine headaches. This medicine may be used for other purposes; ask your health care provider or pharmacist if you have questions. COMMON BRAND NAME(S): AJOVY What should I tell my health care provider before I take this medicine? They need to know if you have any of these conditions:  an unusual or allergic reaction to fremanezumab, other medicines, foods, dyes, or preservatives  pregnant or trying to get pregnant  breast-feeding How should I use this medicine? This medicine is for injection under the skin. You will be taught how to prepare and give this medicine. Use exactly as directed. Take your medicine at regular intervals. Do not take your medicine more often than directed. It is important that you put your used needles and syringes in a special sharps container. Do not put them in a trash can. If you do not have a sharps container, call your pharmacist or healthcare provider to get one. Talk to your pediatrician regarding the use of this medicine in children. Special care may be needed. Overdosage: If you think you have taken too much of this medicine contact a poison control center or emergency room at once. NOTE: This medicine is only for you. Do not share this medicine with others. What if I miss a dose? If you miss a dose, take it as soon as you can. If it is almost time for your next dose, take only that dose. Do not take double or extra doses. What may interact with this medicine? Interactions are not expected. This list may not describe all possible interactions. Give your health care provider a list of all the medicines, herbs, non-prescription drugs, or dietary supplements you use. Also tell them if you smoke, drink alcohol, or use illegal drugs. Some items may interact with your medicine. What should I watch for while using this medicine? Tell your doctor or healthcare  professional if your symptoms do not start to get better or if they get worse. What side effects may I notice from receiving this medicine? Side effects that you should report to your doctor or health care professional as soon as possible:  allergic reactions like skin rash, itching or hives, swelling of the face, lips, or tongue Side effects that usually do not require medical attention (report these to your doctor or health care professional if they continue or are bothersome):  pain, redness, or irritation at site where injected This list may not describe all possible side effects. Call your doctor for medical advice about side effects. You may report side effects to FDA at 1-800-FDA-1088. Where should I keep my medicine? Keep out of the reach of children. You will be instructed on how to store this medicine. Throw away any unused medicine after the expiration date on the label. NOTE: This sheet is a summary. It may not cover all possible information. If you have questions about this medicine, talk to your doctor, pharmacist, or health care provider.  2020 Elsevier/Gold Standard (2016-11-17 17:22:56)  Galcanezumab injection What is this medicine? GALCANEZUMAB (gal ka NEZ ue mab) is used to prevent migraines and treat cluster headaches. This medicine may be used for other purposes; ask your health care provider or pharmacist if you have questions. COMMON BRAND NAME(S): Emgality What should I tell my health care provider before I take this medicine? They need to know if you have any of these conditions:  an unusual or allergic reaction to galcanezumab,  other medicines, foods, dyes, or preservatives  pregnant or trying to get pregnant  breast-feeding How should I use this medicine? This medicine is for injection under the skin. You will be taught how to prepare and give this medicine. Use exactly as directed. Take your medicine at regular intervals. Do not take your medicine more often  than directed. It is important that you put your used needles and syringes in a special sharps container. Do not put them in a trash can. If you do not have a sharps container, call your pharmacist or healthcare provider to get one. Talk to your pediatrician regarding the use of this medicine in children. Special care may be needed. Overdosage: If you think you have taken too much of this medicine contact a poison control center or emergency room at once. NOTE: This medicine is only for you. Do not share this medicine with others. What if I miss a dose? If you miss a dose, take it as soon as you can. If it is almost time for your next dose, take only that dose. Do not take double or extra doses. What may interact with this medicine? Interactions are not expected. This list may not describe all possible interactions. Give your health care provider a list of all the medicines, herbs, non-prescription drugs, or dietary supplements you use. Also tell them if you smoke, drink alcohol, or use illegal drugs. Some items may interact with your medicine. What should I watch for while using this medicine? Tell your doctor or healthcare professional if your symptoms do not start to get better or if they get worse. What side effects may I notice from receiving this medicine? Side effects that you should report to your doctor or health care professional as soon as possible:  allergic reactions like skin rash, itching or hives, swelling of the face, lips, or tongue Side effects that usually do not require medical attention (report these to your doctor or health care professional if they continue or are bothersome):  pain, redness, or irritation at site where injected This list may not describe all possible side effects. Call your doctor for medical advice about side effects. You may report side effects to FDA at 1-800-FDA-1088. Where should I keep my medicine? Keep out of the reach of children. You will be  instructed on how to store this medicine. Throw away any unused medicine after the expiration date on the label. NOTE: This sheet is a summary. It may not cover all possible information. If you have questions about this medicine, talk to your doctor, pharmacist, or health care provider.  2020 Elsevier/Gold Standard (2017-08-05 12:03:23)

## 2018-12-20 NOTE — Addendum Note (Signed)
Addended by: Gildardo Griffes on: 12/20/2018 09:10 AM   Modules accepted: Orders

## 2018-12-23 ENCOUNTER — Telehealth: Payer: Self-pay | Admitting: Neurology

## 2018-12-23 ENCOUNTER — Telehealth: Payer: Self-pay | Admitting: *Deleted

## 2018-12-23 MED ORDER — EMGALITY 120 MG/ML ~~LOC~~ SOAJ: 120.0000 mg | SUBCUTANEOUS | 11 refills | Status: DC

## 2018-12-23 NOTE — Addendum Note (Signed)
Addended by: Gildardo Griffes on: 12/23/2018 05:22 PM   Modules accepted: Orders

## 2018-12-23 NOTE — Telephone Encounter (Signed)
It needs a PA. I will complete one and see if insurance will cover.

## 2018-12-23 NOTE — Telephone Encounter (Signed)
Phone rep checked office voicemail, pt left message stating that the Fremanezumab-vfrm (AJOVY) 225 MG/1.5ML SOAJ is not covered by her insurance.  Pt would like to know if there is another injection Dr Jaynee Eagles would be willing to try for her.  Please call

## 2018-12-23 NOTE — Telephone Encounter (Signed)
Spoke with Dr. Jaynee Eagles. Medicaid prefers Emgality or Aimovig. Pt has been on Aimovig. Will switch to [employer].

## 2018-12-23 NOTE — Telephone Encounter (Signed)
Completed Emgality PA on [employer]. Approved immediately. Confirmation #: [id number] Prior Approval #: [id number] Status: APPROVED  I spoke with pt's mother Nunzio Cory and advised her of the approval. She will contact the pharmacy tomorrow. She also understands pt is to stop Aimovig and the Emgality will be due when the next dose of Aimovig is due and then administer Emgality every 30 days going forward. She verbalized appreciation.

## 2018-12-23 NOTE — Telephone Encounter (Signed)
Spoke with pt's mother. She is amenable to trying the Emgality instead of Ajovy and we will see if insurance approves. She understands this medication needs to be refrigerated and there will be detailed instructions with each pen. Her questions were answered. She was encouraged to go to the [employer] for more information as well. She verbalized appreciation.

## 2019-01-31 NOTE — Progress Notes (Addendum)
PATIENT: Betty Rogers DOB: 07/16/1995  REASON FOR VISIT: follow up HISTORY FROM: patient  Virtual Visit via Telephone Note  I connected with Betty Rogers on 02/01/19 at 11:00 AM EST by telephone and verified that I am speaking with the correct person using two identifiers.   I discussed the limitations, risks, security and privacy concerns of performing an evaluation and management service by telephone and the availability of in person appointments. I also discussed with the patient that there may be a patient responsible charge related to this service. The patient expressed understanding and agreed to proceed.   History of Present Illness:  02/01/19 Betty Rogers is a 23 y.o. female here today for follow up for migraines. She was recently switched from Amovig to [employer]. She has taken two doses. Last dose given 11/29. She continues propranolol 80mg  at bedtime for tremor and migraine prevention. Tremor is unchanged. She has had a migraine for the past 6 days. Unilateral pounding and pressure with sound sensitivity. No nausea or vomiting. She also notes bilateral ear pressure. She has taken a total of 4 sumatriptan 100mg  tablets over the past week with no significant improvement in symptoms. She feels that sumatriptan makes her really groggy. She is seen by Glenna FellowsJo Huges, psychiatry, on multiple medications for management of mood. Sleep has not changed. Mother feels that medications are causing increased sleepiness.    History (copied from Dr Trevor MaceAhern's note on 12/20/2018)   Interval history She is having 3 migraines a month, not doing as well on the Aimovig, discussed immunogenicity will try switching to Ajovy or Erenumab. Will give a refill for Imitrex.    Interval history 07/12/2018: Fantastic response to the Aimovig, last headache last month last migraine months ago. Continue the Aimovig. More shaking, hands and feet. Husband has shaking, more in the hands. No side effects to the  propranol. Will increase propranolol to once at night. Discussed medications she is on can cause tremor but may also be a component of essential tremor. Difficulty with handwriting and eating.   Interval history 10/26/2017:  We started propranolol, she has 15 headaches days a month and she feels improved on the propranolol but still having 8 migraine days a month. But she feels the Aimovig is helping so much she is doing well. Lately she has been having tremors, in the hands when she picks up things. She is very shaky. TSH normal. Tremors started 2 months ago.    HPI:  Betty Rogers is a 23 y.o. female here as a referral from Dr. Sedalia Mutaox for migraines. PMHx anxiety, depression, psychosis, panic attacks, migraine, autism spectrum d/o.  She is on the Aimovig autoinjector once a month. Patient is here with migraines who provide mot information. Started 5 years ago. Mother with migraines. Theys tart in the back of the head, can be unilateral, sharp and throbbing, +light and +sound sensitivity, +nausea, no vomiting. Sometimes she gets blurry an it is hard for her to read. She has ringing in her ears. Has  A lot of neck pain and tightness in her. Daily continuous headaches for over 6 months. The migraines last all day 24 hours. They start right before bed, can be positionally worse. 1/2 the month is migrainous. No medication overuse. She is on Aimovig monthly. She is on 140mg  monthly. Sleep apnea test was negative.   Meds tried: Topamax, Seroquel, Prozac, Depakote, Vistaril, Ativan, buspar, fioricet, compazine, imitrex, Zofran, propranolol  Reviewed notes, labs and imaging from outside physicians, which showed:  MRI  brain 07/22/2017: showed No acute intracranial abnormalities including mass lesion or mass effect, hydrocephalus, extra-axial fluid collection, midline shift, hemorrhage, or acute infarction, large ischemic events (personally reviewed images)  Tsh,hgba1c  unremarkable   Observations/Objective:  Generalized: Well developed, in no acute distress  Mentation: Alert oriented to time, place, history taking. Follows all commands speech and language fluent   Assessment and Plan:  23 y.o. year old female  has a past medical history of Anxiety and depression, Migraines, and Panic attacks. here with    ICD-10-CM   1. Chronic migraine without aura, with intractable migraine, so stated, with status migrainosus  G43.711 rizatriptan (MAXALT-MLT) 10 MG disintegrating tablet  2. Tremor  R25.1   3. Hypersomnolence  G47.10    Has completed 2 injections of Emgality.  Last dose was given 2 days ago.  We have discussed length of time for medication to become effective.  She has had a persistent migraine for the past 6 days that is unresponsive to sumatriptan.  She has taken over-the-counter Aleve for migraines with minimal relief.  We will discontinue sumatriptan and try rizatriptan 10 mg as needed.  She was instructed that this medication should be taken at the onset of a migraine.  She may repeat 1 tablet in 2 hours if needed.  No more than 2 tablets in 24 hours or 9 tablets in a month.  She may use over-the-counter medications as needed but should not exceed 1-2 doses per week.  With further discussion, there is some concern of allergy symptoms.  I suggested that her mom consider giving 25 mg of Benadryl and starting her on Mucinex extended release twice daily.  She was advised to call primary care as well to ensure treatment plan is appropriate.  She may require visit for evaluation should ear pressure or allergy symptoms worsen.  I have discussed adequate hydration.  She is only drinking 1-2 bottles daily.  I have advised she increase to 4 bottles daily.  We have discussed medication side effects and the potential of these medications to make her sleepy.  Mom will monitor patient closely.  Mom wishes to follow-up with Dr. Jaynee Eagles as previously arranged in February.   She was instructed that she may call sooner if needed and I am happy to see her for follow-up.  Betty Rogers and her mother both verbalized understanding and agreement with this plan.   No orders of the defined types were placed in this encounter.   Meds ordered this encounter  Medications   rizatriptan (MAXALT-MLT) 10 MG disintegrating tablet    Sig: Take 1 tablet (10 mg total) by mouth as needed for migraine. May repeat in 2 hours if needed    Dispense:  9 tablet    Refill:  11    Order Specific Question:   Supervising Provider    Answer:   Melvenia Beam [[id number]]     Follow Up Instructions:  I discussed the assessment and treatment plan with the patient. The patient was provided an opportunity to ask questions and all were answered. The patient agreed with the plan and demonstrated an understanding of the instructions.   The patient was advised to call back or seek an in-person evaluation if the symptoms worsen or if the condition fails to improve as anticipated.  I provided 25 minutes of non-face-to-face time during this encounter.  Patient and her mother are located at their place of residence during my chart visit.  Provider is in the office.   Betty Rogers  Betty Petsch, NP   Made any corrections needed, and agree with history, physical, neuro exam,assessment and plan as stated.     Naomie Dean, MD [facility]

## 2019-02-01 ENCOUNTER — Encounter: Payer: Self-pay | Admitting: Family Medicine

## 2019-02-01 ENCOUNTER — Telehealth (INDEPENDENT_AMBULATORY_CARE_PROVIDER_SITE_OTHER): Payer: Medicaid Other | Admitting: Family Medicine

## 2019-02-01 DIAGNOSIS — R251 Tremor, unspecified: Secondary | ICD-10-CM | POA: Diagnosis not present

## 2019-02-01 DIAGNOSIS — G4710 Hypersomnia, unspecified: Secondary | ICD-10-CM

## 2019-02-01 DIAGNOSIS — G43711 Chronic migraine without aura, intractable, with status migrainosus: Secondary | ICD-10-CM

## 2019-02-01 MED ORDER — RIZATRIPTAN BENZOATE 10 MG PO TBDP: 10.0000 mg | ORAL_TABLET | ORAL | 11 refills | Status: DC | PRN

## 2019-04-12 ENCOUNTER — Telehealth: Payer: Self-pay | Admitting: Family Medicine

## 2019-04-12 ENCOUNTER — Telehealth: Payer: Medicaid Other | Admitting: Family Medicine

## 2019-04-12 NOTE — Progress Notes (Deleted)
PATIENT: Betty Rogers DOB: 1996-01-03  REASON FOR VISIT: follow up HISTORY FROM: patient  Virtual Visit via Telephone Note  I connected with Betty Rogers on 04/12/19 at  2:00 PM EST by telephone and verified that I am speaking with the correct person using two identifiers.   I discussed the limitations, risks, security and privacy concerns of performing an evaluation and management service by telephone and the availability of in person appointments. I also discussed with the patient that there may be a patient responsible charge related to this service. The patient expressed understanding and agreed to proceed.   History of Present Illness:  04/12/19 Betty Rogers is a 24 y.o. female here today for follow up for migraines. We last saw her by video on 02/01/2019. Migraines were persistent after 2 doses of Emgality, second dose being given just two days prior to appt. We advised that she continue Emgality, propranolol and switched sumatriptan to rizatriptan. She was also advised to treat allergy symptoms with Mucinex and benadryl. She returns today for continued migraines.    History (copied from my note on 02/01/2019)  Betty Rogers is a 24 y.o. female here today for follow up for migraines. She was recently switched from Amovig to [employer]. She has taken two doses. Last dose given 11/29. She continues propranolol 80mg  at bedtime for tremor and migraine prevention. Tremor is unchanged. She has had a migraine for the past 6 days. Unilateral pounding and pressure with sound sensitivity. No nausea or vomiting. She also notes bilateral ear pressure. She has taken a total of 4 sumatriptan 100mg  tablets over the past week with no significant improvement in symptoms. She feels that sumatriptan makes her really groggy. She is seen by , psychiatry, on multiple medications for management of mood. Sleep has not changed. Mother feels that medications are causing increased sleepiness.      History (copied from Dr note on 12/20/2018)   Interval history She is having 3 migraines a month, not doing as well on the Aimovig, discussed immunogenicity will try switching to Ajovy or Erenumab. Will give a refill for Imitrex.  Interval history 07/12/2018: Fantastic response to the Aimovig, last headache last month last migraine months ago. Continue the Aimovig. More shaking, hands and feet. Husband has shaking, more in the hands. No side effects to the propranol. Will increase propranolol to once at night. Discussed medications she is on can cause tremor but may also be a component of essential tremor. Difficulty with handwriting and eating.   Interval history 10/26/2017: We started propranolol, she has 15 headaches days a month and she feels improved on the propranolol but still having 8 migraine days a month. But she feels the Aimovig is helping so much she is doing well. Lately she has been having tremors, in the hands when she picks up things. She is very shaky. TSH normal. Tremors started 2 months ago.   09/11/2018 Betty Rogers a 24 y.o.femalehere as a referral from Dr. [id number] migraines. PMHx anxiety, depression, psychosis, panic attacks, migraine, autism spectrum d/o. She is on the Aimovig autoinjector once a month. Patient is here with migraines who provide mot information. Started 5 years ago. Mother with migraines. Theys tart in the back of the head, can be unilateral, sharp and throbbing, +light and +sound sensitivity, +nausea, no vomiting. Sometimes she gets blurry an it is hard for her to read. She has ringing in her ears. Has A lot of neck pain and tightness in her. Daily continuous headaches for  over 6 months. The migraines last all day 24 hours. They start right before bed, can be positionally worse. 1/2 the month is migrainous. No medication overuse. She is on Aimovig monthly. She is on 140mg  monthly. Sleep apnea test was negative.   Meds tried: Topamax,  Seroquel, Prozac, Depakote, Vistaril, Ativan, buspar, fioricet, compazine, imitrex, Zofran, propranolol  Reviewed notes, labs and imaging from outside physicians, which showed:  MRI brain 07/22/2017: showed No acute intracranial abnormalities including mass lesion or mass effect, hydrocephalus, extra-axial fluid collection, midline shift, hemorrhage, or acute infarction, large ischemic events (personally reviewed images)  Tsh,hgba1c unremarkable    Observations/Objective:  Generalized: Well developed, in no acute distress  Mentation: Alert oriented to time, place, history taking. Follows all commands speech and language fluent   Assessment and Plan:  24 y.o. year old female  has a past medical history of Anxiety and depression, Migraines, and Panic attacks. here with  No diagnosis found.  No orders of the defined types were placed in this encounter.   No orders of the defined types were placed in this encounter.    Follow Up Instructions:  I discussed the assessment and treatment plan with the patient. The patient was provided an opportunity to ask questions and all were answered. The patient agreed with the plan and demonstrated an understanding of the instructions.   The patient was advised to call back or seek an in-person evaluation if the symptoms worsen or if the condition fails to improve as anticipated.  I provided *** minutes of non-face-to-face time during this encounter.   Debbora Presto, NP

## 2019-04-12 NOTE — Telephone Encounter (Signed)
I called patient at 2:07 for 2pm Mychart visit. Patient reports not knowing how to log on to her Mychart account. I have asked our office to assist her and will attempt to complete visit. Will reschedule if needed.

## 2019-05-10 ENCOUNTER — Encounter: Payer: Self-pay | Admitting: Family Medicine

## 2019-05-10 ENCOUNTER — Telehealth (INDEPENDENT_AMBULATORY_CARE_PROVIDER_SITE_OTHER): Payer: Medicaid Other | Admitting: Family Medicine

## 2019-05-10 DIAGNOSIS — G43709 Chronic migraine without aura, not intractable, without status migrainosus: Secondary | ICD-10-CM | POA: Diagnosis not present

## 2019-05-10 DIAGNOSIS — R251 Tremor, unspecified: Secondary | ICD-10-CM | POA: Diagnosis not present

## 2019-05-10 DIAGNOSIS — G43711 Chronic migraine without aura, intractable, with status migrainosus: Secondary | ICD-10-CM | POA: Diagnosis not present

## 2019-05-10 MED ORDER — PROPRANOLOL HCL ER 80 MG PO CP24: 80.0000 mg | ORAL_CAPSULE | Freq: Every day | ORAL | 6 refills | Status: DC

## 2019-05-10 NOTE — Progress Notes (Addendum)
PATIENT: Betty Rogers DOB: [birth year]  REASON FOR VISIT: follow up HISTORY FROM: patient  Virtual Visit via Telephone Note  I connected with Betty Rogers on 05/10/19 at  8:00 AM EST by telephone and verified that I am speaking with the correct person using two identifiers.   I discussed the limitations, risks, security and privacy concerns of performing an evaluation and management service by telephone and the availability of in person appointments. I also discussed with the patient that there may be a patient responsible charge related to this service. The patient expressed understanding and agreed to proceed.   History of Present Illness:  05/10/19 Betty Rogers is a 24 y.o. female here today for follow up for migraines.  She presents today with her mother, who aids in history via MyChart video.  Betty Rogers reports that she is doing well.  Headaches have improved.  She continues Emgality monthly.  She is also taking propranolol 80 mg daily at bedtime.  She reports that, on average, she has about 4 headache days per month.  She has been using rizatriptan for abortive therapy that works well.  She continues to note grogginess after taking triptan therapy but feels that it helps significantly with migraine abortion.  No other side effects noted.  She continues to follow with Betty Rogers every 3 months.  Mom reports that tremors have improved as well.  Meds tried: Amovig, Emgality,Topamax, Seroquel, Prozac, Depakote, Vistaril, Ativan, buspar, fioricet, compazine, imitrex, Zofran, propranolol  History (copied from my note on 02/01/2019)  Betty Rogers is a 24 y.o. female here today for follow up for migraines. She was recently switched from Amovig to [employer]. She has taken two doses. Last dose given 11/29. She continues propranolol 80mg  at bedtime for tremor and migraine prevention. Tremor is unchanged. She has had a migraine for the past 6 days. Unilateral pounding and pressure with  sound sensitivity. No nausea or vomiting. She also notes bilateral ear pressure. She has taken a total of 4 sumatriptan 100mg  tablets over the past week with no significant improvement in symptoms. She feels that sumatriptan makes her really groggy. She is seen by , psychiatry, on multiple medications for management of mood. Sleep has not changed. Mother feels that medications are causing increased sleepiness.   History (copied from Dr note on 12/20/2018)   Interval history She is having 3 migraines a month, not doing as well on the Aimovig, discussed immunogenicity will try switching to Ajovy or Erenumab. Will give a refill for Imitrex.  Interval history 07/12/2018: Fantastic response to the Aimovig, last headache last month last migraine months ago. Continue the Aimovig. More shaking, hands and feet. Husband has shaking, more in the hands. No side effects to the propranol. Will increase propranolol to once at night. Discussed medications she is on can cause tremor but may also be a component of essential tremor. Difficulty with handwriting and eating.   Interval history 10/26/2017: We started propranolol, she has 15 headaches days a month and she feels improved on the propranolol but still having 8 migraine days a month. But she feels the Aimovig is helping so much she is doing well. Lately she has been having tremors, in the hands when she picks up things. She is very shaky. TSH normal. Tremors started 2 months ago.   09/11/2018 Betty Rogers a 24 y.o.femalehere as a referral from Dr. [id number] migraines. PMHx anxiety, depression, psychosis, panic attacks, migraine, autism spectrum d/o. She is on the Aimovig autoinjector once a month.  Patient is here with migraines who provide mot information. Started 5 years ago. Mother with migraines. Theys tart in the back of the head, can be unilateral, sharp and throbbing, +light and +sound sensitivity, +nausea, no vomiting. Sometimes she  gets blurry an it is hard for her to read. She has ringing in her ears. Has A lot of neck pain and tightness in her. Daily continuous headaches for over 6 months. The migraines last all day 24 hours. They start right before bed, can be positionally worse. 1/2 the month is migrainous. No medication overuse. She is on Aimovig monthly. She is on 140mg  monthly. Sleep apnea test was negative.   Meds tried: Topamax, Seroquel, Prozac, Depakote, Vistaril, Ativan, buspar, fioricet, compazine, imitrex, Zofran, propranolol  Reviewed notes, labs and imaging from outside physicians, which showed:  MRI brain 07/22/2017: showed No acute intracranial abnormalities including mass lesion or mass effect, hydrocephalus, extra-axial fluid collection, midline shift, hemorrhage, or acute infarction, large ischemic events (personally reviewed images)  Tsh,hgba1c unremarkable   Observations/Objective:  Generalized: Well developed, in no acute distress  Mentation: Alert oriented to time, place, history taking. Follows all commands speech and language fluent   Assessment and Plan:  24 y.o. year old female  has a past medical history of Anxiety and depression, Migraines, and Panic attacks. here with    ICD-10-CM   1. Chronic migraine without aura without status migrainosus, not intractable  G43.709   2. Chronic migraine without aura, with intractable migraine, so stated, with status migrainosus  G43.711 propranolol ER (INDERAL LA) 80 MG 24 hr capsule  3. Tremor  R25.1 propranolol ER (INDERAL LA) 80 MG 24 hr capsule   [name] has noted significant improvement in headache frequency and intensity over the past 3 months.  She continues Emgality and propranolol as prescribed.  Rizatriptan has helped with abortive therapy.  She is tolerating medications well.  We will continue current therapy.  She was encouraged to follow-up with psychiatry and PCP as advised.  She will continue healthy lifestyle habits with  well-balanced diet, regular exercise and adequate hydration.  She will follow-up with me in 6 months, sooner if needed.  Patient and her mother verbalize understanding and agreement with this plan  No orders of the defined types were placed in this encounter.   Meds ordered this encounter  Medications  . propranolol ER (INDERAL LA) 80 MG 24 hr capsule    Sig: Take 1 capsule (80 mg total) by mouth at bedtime.    Dispense:  30 capsule    Refill:  6    Order Specific Question:   Supervising Provider    Answer:   Melvenia Beam [id number]     Follow Up Instructions:  I discussed the assessment and treatment plan with the patient. The patient was provided an opportunity to ask questions and all were answered. The patient agreed with the plan and demonstrated an understanding of the instructions.   The patient was advised to call back or seek an in-person evaluation if the symptoms worsen or if the condition fails to improve as anticipated.  I provided 15 minutes of non-face-to-face time during this encounter.  Patient and her mother are located at their place of residence during my chart visit.  Provider is in the office   Debbora Presto, NP   Made any corrections needed, and agree with history, physical, neuro exam,assessment and plan as stated.     Sarina Ill, MD [facility]

## 2019-07-19 ENCOUNTER — Telehealth: Payer: Self-pay | Admitting: Family Medicine

## 2019-07-19 NOTE — Telephone Encounter (Signed)
I called and LMVM for mother of pt.  Most diet soda's may have aspartame, but should be on ingredient on label but also can google this information too.  She is to call back if needed.

## 2019-07-19 NOTE — Telephone Encounter (Signed)
Pt's mother orene, abbasi called asking which soda's have aspartame.  Please call

## 2019-11-15 ENCOUNTER — Encounter: Payer: Self-pay | Admitting: Family Medicine

## 2019-11-15 ENCOUNTER — Telehealth (INDEPENDENT_AMBULATORY_CARE_PROVIDER_SITE_OTHER): Payer: Medicaid Other | Admitting: Family Medicine

## 2019-11-15 DIAGNOSIS — G43709 Chronic migraine without aura, not intractable, without status migrainosus: Secondary | ICD-10-CM

## 2019-11-15 NOTE — Progress Notes (Addendum)
PATIENT: Betty Rogers DOB: 1995/10/14  REASON FOR VISIT: follow up HISTORY FROM: patient  Virtual Visit via Telephone Note  I connected with Betty Rogers on 11/15/19 at  8:00 AM EDT by telephone and verified that I am speaking with the correct person using two identifiers.   I discussed the limitations, risks, security and privacy concerns of performing an evaluation and management service by telephone and the availability of in person appointments. I also discussed with the patient that there may be a patient responsible charge related to this service. The patient expressed understanding and agreed to proceed.   History of Present Illness:  11/15/19 Betty Rogers is a 24 y.o. female here today for follow up for migraines.  She continues Emgality 8 monthly and propranolol every day at bedtime.  She reports that she is doing very well.  She may have 2 migraine days per month.  Rizatriptan helps to abort migraines.  She is feeling well and without concerns.  She continues an adult camp throughout the week.  Mother is primary caregiver.   History (copied from my note on 05/10/2019)  Betty Rogers is a 24 y.o. female here today for follow up for migraines.  She presents today with her mother, who aids in history via MyChart video.  Betty Rogers reports that she is doing well.  Headaches have improved.  She continues Emgality monthly.  She is also taking propranolol 80 mg daily at bedtime.  She reports that, on average, she has about 4 headache days per month.  She has been using rizatriptan for abortive therapy that works well.  She continues to note grogginess after taking triptan therapy but feels that it helps significantly with migraine abortion.  No other side effects noted.  She continues to follow with Starling Manns every 3 months.  Mom reports that tremors have improved as well.  Meds tried: Amovig, Emgality,Topamax, Seroquel, Prozac, Depakote, Vistaril, Ativan, buspar, fioricet,  compazine, imitrex, Zofran, propranolol  History (copied from my note on 02/01/2019)  Betty Rogers a 24 y.o.femalehere today for follow up for migraines. She was recently switched from Amovig to [employer]She has taken two doses. Last dose given 11/29.She continues propranolol 80mg  at bedtime for tremorand migraine prevention.Tremor is unchanged.She has had a migraine for the past 6 days.Unilateral pounding and pressure with sound sensitivity. No nausea or vomiting. She also notes bilateral ear pressure.Shehas taken a total of 4sumatriptan 100mg  tablets over the past week with no significant improvement in symptoms. She feels that sumatriptan makes her really groggy.She is seen by , psychiatry, on multiple medications for management of mood. Sleephas not changed. Mother feels that medications are causing increased sleepiness.  History (copied from Dr note on 12/20/2018)  Interval history She is having 3 migraines a month, not doing as well on the Aimovig, discussed immunogenicity will try switching to Ajovy or Erenumab. Will give a refill for Imitrex.  Interval history 07/12/2018: Fantastic response to the Aimovig, last headache last month last migraine months ago. Continue the Aimovig. More shaking, hands and feet. Husband has shaking, more in the hands. No side effects to the propranol. Will increase propranolol to once at night. Discussed medications she is on can cause tremor but may also be a component of essential tremor. Difficulty with handwriting and eating.   Interval history 10/26/2017: We started propranolol, she has 15 headaches days a month and she feels improved on the propranolol but still having 8 migraine days a month. But she feels the Aimovig  is helping so much she is doing well. Lately she has been having tremors, in the hands when she picks up things. She is very shaky. TSH normal. Tremors started 2 months ago.   Betty  Rogers a 25 y.o.femalehere as a referral from Dr. Alphonsa Overall migraines. PMHx anxiety, depression, psychosis, panic attacks, migraine, autism spectrum d/o. She is on the Aimovig autoinjector once a month. Patient is here with migraines who provide mot information. Started 5 years ago. Mother with migraines. Theys tart in the back of the head, can be unilateral, sharp and throbbing, +light and +sound sensitivity, +nausea, no vomiting. Sometimes she gets blurry an it is hard for her to read. She has ringing in her ears. Has A lot of neck pain and tightness in her. Daily continuous headaches for over 6 months. The migraines last all day 24 hours. They start right before bed, can be positionally worse. 1/2 the month is migrainous. No medication overuse. She is on Aimovig monthly. She is on 140mg  monthly. Sleep apnea test was negative.   Meds tried: Topamax, Seroquel, Prozac, Depakote, Vistaril, Ativan, buspar, fioricet, compazine, imitrex, Zofran, propranolol  Reviewed notes, labs and imaging from outside physicians, which showed:  MRI brain 07/22/2017: showed No acute intracranial abnormalities including mass lesion or mass effect, hydrocephalus, extra-axial fluid collection, midline shift, hemorrhage, or acute infarction, large ischemic events (personally reviewed images)  Tsh,hgba1c unremarkable   Observations/Objective:  Generalized: Well developed, in no acute distress  Mentation: Alert oriented to time, place, history taking. Follows all commands speech and language fluent   Assessment and Plan:  24 y.o. year old female  has a past medical history of Anxiety and depression, Migraines, and Panic attacks. here with  No diagnosis found.   Betty Rogers is doing very well with current treatment plan.  We will continue Emgality every 30 days, propranolol 80 mg daily at bedtime and rizatriptan as needed.  I have encouraged her to journal headaches so that she is aware when a migraine may occur.   Complementary treatment options discussed.  I have reviewed this information with her mother as well.  Healthy lifestyle habits encouraged.  She will follow-up with me in 1 year, sooner if needed.  She and her mother both verbalized understanding and agreement with this plan.  No orders of the defined types were placed in this encounter.   No orders of the defined types were placed in this encounter.    Follow Up Instructions:  I discussed the assessment and treatment plan with the patient. The patient was provided an opportunity to ask questions and all were answered. The patient agreed with the plan and demonstrated an understanding of the instructions.   The patient was advised to call back or seek an in-person evaluation if the symptoms worsen or if the condition fails to improve as anticipated.  I provided 15 minutes of non-face-to-face time during this encounter.  Patient and her mother are located at their place of residence during my chart visit.  Providers in the office.   Nehemiah Settle, NP   Made any corrections needed, and agree with history, physical, neuro exam,assessment and plan as stated.     Shawnie Dapper, MD [facility]

## 2019-11-30 ENCOUNTER — Other Ambulatory Visit: Payer: Self-pay

## 2019-11-30 ENCOUNTER — Ambulatory Visit (INDEPENDENT_AMBULATORY_CARE_PROVIDER_SITE_OTHER): Payer: Medicaid Other | Admitting: Family Medicine

## 2019-11-30 ENCOUNTER — Encounter: Payer: Self-pay | Admitting: Family Medicine

## 2019-11-30 VITALS — BP 110/74 | HR 87 | Temp 97.3°F | Ht 64.0 in | Wt 182.0 lb

## 2019-11-30 DIAGNOSIS — L600 Ingrowing nail: Secondary | ICD-10-CM | POA: Diagnosis not present

## 2019-11-30 DIAGNOSIS — Z23 Encounter for immunization: Secondary | ICD-10-CM

## 2019-11-30 DIAGNOSIS — Z0000 Encounter for general adult medical examination without abnormal findings: Secondary | ICD-10-CM | POA: Diagnosis not present

## 2019-11-30 MED ORDER — CEPHALEXIN 500 MG PO CAPS: 500.0000 mg | ORAL_CAPSULE | Freq: Two times a day (BID) | ORAL | 0 refills | Status: DC

## 2019-11-30 NOTE — Progress Notes (Signed)
Subjective:  Patient ID: Betty Rogers, female    DOB: [birth year]  Age: 24 y.o. MRN: [id number]  Chief Complaint  Patient presents with  . Annual Exam    HPI Well Adult Physical: Patient here for a comprehensive physical exam. Encounter for general adult medical examination without abnormal findings  Physical ("At Risk" items are starred): Patient's last physical exam was 1 year ago .  Weight: Not appropriate for height (BMI 31.24) ;  Blood Pressure: Normal (BP less than 120/80) ;  Medical History: Patient history reviewed ; Family history reviewed ;  Allergies Reviewed: No change in current allergies ;  Medications Reviewed: Medications reviewed - no changes ;  Lipids: Abnormal lipid levels ;  Diet: Soda (some), some vegetables. No fruit. Avoids fried foods and sweets.  Smoking: Life-long non-smoker ;  Physical Activity: Does not exercise at least 3 times per week;  Alcohol/Drug Use: Is a non-drinker ; No illicit drug use ;  Patient is not afflicted from Stress Incontinence and Urge Incontinence  Safety: reviewed ; Patient wears a seat belt, has smoke detectors, has carbon monoxide detectors, practices appropriate gun safety, and wears sunscreen with extended sun exposure. Dental Care: biannual cleanings, brushes and flosses daily. Ophthalmology/Optometry: Annual visit.  Hearing loss: none Vision impairments: wears glasses   LMP: Unknown. On birth control Pregnancy history: none Safe at home: yes Self breast exams: yes    Social Hx   Social History   Socioeconomic History  . Marital status: Single    Spouse name: Not on file  . Number of children: Not on file  . Years of education: Not on file  . Highest education level: High school graduate  Occupational History  . Not on file  Tobacco Use  . Smoking status: Never Smoker  . Smokeless tobacco: Never Used  Vaping Use  . Vaping Use: Never used  Substance and Sexual Activity  . Alcohol use: Yes    Comment: "a  half a wine cooler occasionally"  . Drug use: Never  . Sexual activity: Not Currently    Birth control/protection: Abstinence, Injection  Other Topics Concern  . Not on file  Social History Narrative   Lives at home with her parents   Right handed   No caffeine   Social Determinants of Health   Financial Resource Strain:   . Difficulty of Paying Living Expenses: Not on file  Food Insecurity:   . Worried About Programme researcher, broadcasting/film/video in the Last Year: Not on file  . Ran Out of Food in the Last Year: Not on file  Transportation Needs:   . Lack of Transportation (Medical): Not on file  . Lack of Transportation (Non-Medical): Not on file  Physical Activity:   . Days of Exercise per Week: Not on file  . Minutes of Exercise per Session: Not on file  Stress:   . Feeling of Stress : Not on file  Social Connections:   . Frequency of Communication with Friends and Family: Not on file  . Frequency of Social Gatherings with Friends and Family: Not on file  . Attends Religious Services: Not on file  . Active Member of Clubs or Organizations: Not on file  . Attends Banker Meetings: Not on file  . Marital Status: Not on file   Past Medical History:  Diagnosis Date  . Anxiety and depression   . Migraines   . Panic attacks    Past Surgical History:  Procedure Laterality Date  .  CHOLECYSTECTOMY    . CHOLECYSTECTOMY    . ESOPHAGOGASTRODUODENOSCOPY    . REMOVAL BIRTH MARK ON HEAD   2008    Family History  Problem Relation Age of Onset  . Heart attack Other   . Cancer Other     Review of Systems  Constitutional: Negative for chills, fatigue and fever.  HENT: Negative for congestion, ear pain, rhinorrhea and sore throat.   Respiratory: Negative for cough and shortness of breath.   Cardiovascular: Negative for chest pain.  Gastrointestinal: Negative for abdominal pain, constipation, diarrhea, nausea and vomiting.  Genitourinary: Negative for dysuria and urgency.   Musculoskeletal: Negative for back pain and myalgias.  Neurological: Negative for dizziness, weakness, light-headedness and headaches.  Psychiatric/Behavioral: Negative for dysphoric mood. The patient is not nervous/anxious.      Objective:  BP 110/74   Pulse 87   Temp (!) 97.3 F (36.3 C)   Ht 5\' 4"  (1.626 m)   Wt 182 lb (82.6 kg)   SpO2 99%   BMI 31.24 kg/m   BP/Weight 11/30/2019 12/20/2018 10/26/2017  Systolic BP 110 99 92  Diastolic BP 74 65 69  Wt. (Lbs) 182 178 179  BMI 31.24 30.55 30.73  Some encounter information is confidential and restricted. Go to Review Flowsheets activity to see all data.    Physical Exam Vitals reviewed.  Constitutional:      General: She is not in acute distress.    Appearance: Normal appearance. She is obese.  HENT:     Right Ear: Tympanic membrane and ear canal normal.     Left Ear: Tympanic membrane and ear canal normal.     Nose: Nose normal. No congestion or rhinorrhea.  Eyes:     Conjunctiva/sclera: Conjunctivae normal.  Neck:     Thyroid: No thyroid mass.  Cardiovascular:     Rate and Rhythm: Normal rate and regular rhythm.     Pulses: Normal pulses.     Heart sounds: No murmur heard.   Pulmonary:     Effort: Pulmonary effort is normal.     Breath sounds: Normal breath sounds.  Abdominal:     General: Bowel sounds are normal.     Palpations: Abdomen is soft. There is no mass.     Tenderness: There is no abdominal tenderness.  Musculoskeletal:        General: Normal range of motion.  Lymphadenopathy:     Cervical: No cervical adenopathy.  Skin:    General: Skin is warm and dry.     Comments: Ingrown toenail of fifth digit of right foot. (scheduled to see podiatry next week)  Neurological:     Mental Status: She is alert and oriented to person, place, and time.     Cranial Nerves: No cranial nerve deficit.  Psychiatric:        Mood and Affect: Mood normal.        Behavior: Behavior normal.     Lab Results   Component Value Date   WBC 7.9 07/18/2017   HGB 15.2 (H) 07/18/2017   HCT 44.5 07/18/2017   PLT 222 07/18/2017   GLUCOSE 99 07/18/2017   CHOL 253 (H) 07/21/2017   TRIG 312 (H) 07/21/2017   HDL 29 (L) 07/21/2017   LDLCALC 162 (H) 07/21/2017   ALT 21 07/18/2017   AST 24 07/18/2017   NA 140 07/18/2017   K 4.1 07/18/2017   CL 103 07/18/2017   CREATININE 1.02 (H) 07/18/2017   BUN 10 07/18/2017   CO2  24 07/18/2017   TSH 1.494 07/21/2017   HGBA1C 4.6 (L) 07/21/2017      Assessment & Plan:  1. Routine medical exam Recommend continue to work on eating healthy diet and exercise. Recommend lose weight.  Cholesterol was recently high at gynecologist. Low fat diet given.  2. Need for immunization against influenza - Flu Vaccine QUAD 36+ mos IM  3. Ingrown nail of fifth toe of right foot - cephALEXin (KEFLEX) 500 MG capsule; Take 1 capsule (500 mg total) by mouth 2 (two) times daily.  Dispense: 14 capsule; Refill: 0 Keep appt with Podiatry.  Body mass index is 31.24 kg/m.    This is a list of the screening recommended for you and due dates:  Health Maintenance  Topic Date Due  .  Hepatitis C: One time screening is recommended by Center for Disease Control  (CDC) for  adults born from [birth year] through 1966.   Never done  . COVID-19 Vaccine (1) Never done  . HIV Screening  Never done  . Tetanus Vaccine  Never done  . Pap Smear  Never done  . Pap Smear  Never done  . Flu Shot  Completed     AN INDIVIDUALIZED CARE PLAN: was established or reinforced today.   SELF MANAGEMENT: The patient and I together assessed ways to personally work towards obtaining the recommended goals  Support needs The patient and/or family needs were assessed and services were offered if appropriate.  Meds ordered this encounter  Medications  . cephALEXin (KEFLEX) 500 MG capsule    Sig: Take 1 capsule (500 mg total) by mouth 2 (two) times daily.    Dispense:  14 capsule    Refill:  0    Follow-up:  Return in about 4 months (around 03/31/2020) for Fasting.Marland Kitchen  An After Visit Summary was printed and given to the patient.  Blane Ohara [facility] [phone]

## 2019-11-30 NOTE — Patient Instructions (Addendum)

## 2019-12-08 ENCOUNTER — Other Ambulatory Visit: Payer: Self-pay

## 2019-12-08 ENCOUNTER — Encounter: Payer: Self-pay | Admitting: Podiatry

## 2019-12-08 ENCOUNTER — Ambulatory Visit: Payer: Medicaid Other | Admitting: Podiatry

## 2019-12-08 DIAGNOSIS — L600 Ingrowing nail: Secondary | ICD-10-CM | POA: Diagnosis not present

## 2019-12-08 DIAGNOSIS — M79676 Pain in unspecified toe(s): Secondary | ICD-10-CM

## 2019-12-08 NOTE — Patient Instructions (Signed)

## 2019-12-08 NOTE — Progress Notes (Signed)
°  Subjective:  Patient ID: Betty Rogers, female    DOB: 08-31-1995,  MRN: [id number]  No chief complaint on file.   24 y.o. female presents with the above complaint. History confirmed with patient. States the left great toe continues to hurt and she cannot wear certain shoes because of it.  Objective:  Physical Exam: warm, good capillary refill, no trophic changes or ulcerative lesions, normal DP and PT pulses and normal sensory exam.  Painful ingrowing nail at lateral border of the left, hallux; without warmth, erythema or drainage  Assessment:   1. Ingrown nail   2. Pain around toenail    Plan:  Patient was evaluated and treated and all questions answered.  Ingrown Nail, left -Patient elects to proceed with ingrown toenail removal today -Ingrown nail excised. See procedure note. -Educated on post-procedure care including soaking. Written instructions provided. -Rx for Cortisporin drops -Patient to follow up in 2 weeks for nail check.  Procedure: Excision of Ingrown Toenail Location: Left 1st toe lateral nail borders. Anesthesia: Lidocaine 1% plain; 1.34mL and Marcaine 0.5% plain; 1.19mL, digital block. Skin Prep: Betadine. Dressing: Silvadene; telfa; dry, sterile, compression dressing. Technique: Following skin prep, the toe was exsanguinated and a tourniquet was secured at the base of the toe. The affected nail border was freed, split with a nail splitter, and excised. Chemical matrixectomy was then performed with phenol and irrigated out with alcohol. The tourniquet was then removed and sterile dressing applied. Disposition: Patient tolerated procedure well. Patient to return in 2 weeks for follow-up.   Return in about 2 weeks (around 12/22/2019) for Nail Check.

## 2019-12-12 ENCOUNTER — Encounter: Payer: Self-pay | Admitting: Podiatry

## 2019-12-26 ENCOUNTER — Ambulatory Visit (INDEPENDENT_AMBULATORY_CARE_PROVIDER_SITE_OTHER): Payer: Medicaid Other | Admitting: Podiatry

## 2019-12-26 ENCOUNTER — Other Ambulatory Visit: Payer: Self-pay

## 2019-12-26 ENCOUNTER — Encounter: Payer: Self-pay | Admitting: Podiatry

## 2019-12-26 DIAGNOSIS — L600 Ingrowing nail: Secondary | ICD-10-CM | POA: Diagnosis not present

## 2019-12-26 DIAGNOSIS — M79676 Pain in unspecified toe(s): Secondary | ICD-10-CM | POA: Diagnosis not present

## 2019-12-26 NOTE — Progress Notes (Signed)
°  Subjective:  Patient ID: Betty Rogers, female    DOB: June 11, 1995,  MRN: [id number]  Chief Complaint  Patient presents with   Nail Problem    left big toenail is doing better was burning so i took the bandaid off     24 y.o. female presents for follow up of nail procedure. History confirmed with patient.  Doing well states that the nail is hurting only occasionally has had no other issues.  Objective:  Physical Exam: Ingrown nail avulsion site: overlying soft crust, no warmth, no drainage and no erythema Assessment:   1. Ingrown nail   2. Pain around toenail      Plan:  Patient was evaluated and treated and all questions answered.  S/p Ingrown Toenail Excision, left -Healing well without issue. -Nail border gently curettaged and ointment and Band-Aid applied -Discussed return precautions. -F/u PRN

## 2019-12-29 ENCOUNTER — Other Ambulatory Visit: Payer: Self-pay | Admitting: Neurology

## 2020-01-09 ENCOUNTER — Other Ambulatory Visit: Payer: Self-pay | Admitting: Neurology

## 2020-01-09 DIAGNOSIS — R251 Tremor, unspecified: Secondary | ICD-10-CM

## 2020-01-09 DIAGNOSIS — G43711 Chronic migraine without aura, intractable, with status migrainosus: Secondary | ICD-10-CM

## 2020-02-06 ENCOUNTER — Telehealth: Payer: Self-pay | Admitting: Family Medicine

## 2020-02-06 DIAGNOSIS — G43711 Chronic migraine without aura, intractable, with status migrainosus: Secondary | ICD-10-CM

## 2020-02-06 MED ORDER — RIZATRIPTAN BENZOATE 10 MG PO TBDP: 10.0000 mg | ORAL_TABLET | ORAL | 11 refills | Status: DC | PRN

## 2020-02-06 NOTE — Telephone Encounter (Signed)
Prescription expired.  Refilled for a year.  Relayed to mother of pt.  She was appreciative.

## 2020-02-06 NOTE — Telephone Encounter (Signed)
Pt is needing a refill on her rizatriptan (MAXALT-MLT) 10 MG disintegrating tablet sent in to the [facility] Pt's mother states pt has refills and does not understand why the pharmacy will not fill it for her. Please advise.

## 2020-03-24 NOTE — Progress Notes (Signed)
Subjective:  Patient ID: Betty Rogers, female    DOB: [birth year]  Age: 25 y.o. MRN: [id number]  Chief Complaint  Patient presents with  . Headache    HPI Autism: Getting a job Psychologist, occupational.  Migraines - on propranolol, emgallity. Her migraines have been very well controlled with medications.  Depression with anxiety: controlled on abilify 5 mg once daily, buspirone 10 mg one twice d aily, depakote ER 250 mg once a day at bedtime, prozac 20 mg once daily, hydroxyzine 25 mg once daily, lorazepam 1 mg twice daily prn. Has not needed lorazepam. GERD; takes omeprazole. Controls acid unless anxious.  IBS-D: bentyl qac, qhs.  Hyperlipidemia: needs checked today.   Current Outpatient Medications on File Prior to Visit  Medication Sig Dispense Refill  . ARIPiprazole (ABILIFY) 5 MG tablet Take 5 mg by mouth at bedtime.    . busPIRone (BUSPAR) 10 MG tablet Take 10 mg by mouth 2 (two) times daily.    . butalbital-acetaminophen-caffeine (FIORICET, ESGIC) 50-325-40 MG tablet Take 1-2 tablets by mouth every 4 (four) hours as needed for headache. No more than 6 per day    . dicyclomine (BENTYL) 20 MG tablet Take 10 mg by mouth 4 (four) times daily -  before meals and at bedtime.     . divalproex (DEPAKOTE ER) 250 MG 24 hr tablet Take 250 mg by mouth at bedtime.    Marland Kitchen EMGALITY 120 MG/ML SOAJ INJECT 120MG  INTO THE SKIN EVERY 30 DAYS 1 mL 11  . FLUoxetine (PROZAC) 20 MG capsule Take 1 capsule (20 mg total) by mouth daily. 30 capsule 0  . hydrOXYzine (VISTARIL) 25 MG capsule Take 25 mg by mouth at bedtime.    LORazepam (ATIVAN) 1 MG tablet Take 1 mg by mouth 2 (two) times daily.    . medroxyPROGESTERone Acetate (DEPO-PROVERA IM) Inject into the muscle every 3 (three) months.    Marland Kitchen omeprazole (PRILOSEC) 20 MG capsule Take 20 mg by mouth 2 (two) times daily.    . propranolol ER (INDERAL LA) 80 MG 24 hr capsule TAKE 1 CAPSULE BY MOUTH AT BEDTIME 30 capsule 11  . rizatriptan (MAXALT-MLT) 10 MG disintegrating  tablet Take 1 tablet (10 mg total) by mouth as needed for migraine. May repeat in 2 hours if needed 9 tablet 11  . VITAMIN D PO Take 125 mcg by mouth daily.    . prochlorperazine (COMPAZINE) 10 MG tablet Take 10 mg by mouth 3 (three) times daily. (Patient not taking: Reported on 03/26/2020)     No current facility-administered medications on file prior to visit.   Past Medical History:  Diagnosis Date  . Anxiety and depression   . Migraines   . Panic attacks    Past Surgical History:  Procedure Laterality Date  . CHOLECYSTECTOMY    . CHOLECYSTECTOMY    . ESOPHAGOGASTRODUODENOSCOPY    . REMOVAL BIRTH MARK ON HEAD   2008    Family History  Problem Relation Age of Onset  . Heart attack Other   . Cancer Other    Social History   Socioeconomic History  . Marital status: Single    Spouse name: Not on file  . Number of children: Not on file  . Years of education: Not on file  . Highest education level: High school graduate  Occupational History  . Not on file  Tobacco Use  . Smoking status: Never Smoker  . Smokeless tobacco: Never Used  Vaping Use  . Vaping Use: Never used  Substance and Sexual Activity  . Alcohol use: Yes    Comment: "a half a wine cooler occasionally"  . Drug use: Never  . Sexual activity: Not Currently    Birth control/protection: Abstinence, Injection  Other Topics Concern  . Not on file  Social History Narrative   Lives at home with her parents   Right handed   No caffeine   Social Determinants of Health   Financial Resource Strain: Not on file  Food Insecurity: Not on file  Transportation Needs: Not on file  Physical Activity: Not on file  Stress: Not on file  Social Connections: Not on file    Review of Systems  Constitutional: Negative for chills, fatigue and fever.  HENT: Negative for congestion, rhinorrhea and sore throat.   Respiratory: Negative for cough and shortness of breath.   Cardiovascular: Negative for chest pain.   Gastrointestinal: Positive for constipation. Negative for abdominal pain, diarrhea, nausea and vomiting.       Abdominal bloating  Genitourinary: Negative for dysuria and urgency.  Musculoskeletal: Negative for back pain and myalgias.  Neurological: Negative for dizziness, weakness, light-headedness and headaches.  Psychiatric/Behavioral: Negative for dysphoric mood. The patient is nervous/anxious.      Objective:  BP 106/60   Pulse 92   Temp (!) 97.3 F (36.3 C)   Resp 18   Ht 5\' 4"  (1.626 m)   Wt 188 lb 9.6 oz (85.5 kg)   BMI 32.37 kg/m   BP/Weight 03/26/2020 11/30/2019 12/20/2018  Systolic BP 106 110 99  Diastolic BP 60 74 65  Wt. (Lbs) 188.6 182 178  BMI 32.37 31.24 30.55  Some encounter information is confidential and restricted. Go to Review Flowsheets activity to see all data.    Physical Exam Vitals reviewed.  Constitutional:      Appearance: Normal appearance. She is normal weight.  Neck:     Vascular: No carotid bruit.  Cardiovascular:     Rate and Rhythm: Normal rate and regular rhythm.     Pulses: Normal pulses.     Heart sounds: Normal heart sounds.  Pulmonary:     Effort: Pulmonary effort is normal. No respiratory distress.     Breath sounds: Normal breath sounds.  Abdominal:     General: Abdomen is flat. Bowel sounds are normal.     Palpations: Abdomen is soft.     Tenderness: There is no abdominal tenderness.  Neurological:     Mental Status: She is alert and oriented to person, place, and time.  Psychiatric:        Mood and Affect: Mood normal.        Behavior: Behavior normal.     Diabetic Foot Exam - Simple   No data filed      Lab Results  Component Value Date   WBC 8.2 03/26/2020   HGB 15.4 03/26/2020   HCT 45.0 03/26/2020   PLT 285 03/26/2020   GLUCOSE 88 03/26/2020   CHOL 176 03/26/2020   TRIG 153 (H) 03/26/2020   HDL 34 (L) 03/26/2020   LDLCALC 115 (H) 03/26/2020   ALT 44 (H) 03/26/2020   AST 21 03/26/2020   NA 144  03/26/2020   K 4.9 03/26/2020   CL 108 (H) 03/26/2020   CREATININE 0.93 03/26/2020   BUN 12 03/26/2020   CO2 19 (L) 03/26/2020   TSH 1.810 03/26/2020   HGBA1C 4.9 03/26/2020      Assessment & Plan:   1. PCOS (polycystic ovarian syndrome) - TSH -  Hemoglobin A1c  2. Mild recurrent major depression (HCC) The current medical regimen is effective;  continue present plan and medications. Management per specialist, psychiatry.  3. Need for diphtheria-tetanus-pertussis (Tdap) vaccine - Tdap vaccine greater than or equal to 7yo IM  4. Autism Has special assistance.  5. Mixed hyperlipidemia Unknown. Not checked in > 1 yr. .  Continue to work on eating a healthy diet and exercise.  Labs drawn today.  - CBC with Differential/Platelet - Comprehensive metabolic panel - Lipid panel  6. Other migraine without status migrainosus, not intractable  Well controlled. The current medical regimen is effective;  continue present plan and medications.  7. BMI 32. Obesity. Healthy diet and exercise.   Orders Placed This Encounter  Procedures  . Tdap vaccine greater than or equal to 7yo IM  . CBC with Differential/Platelet  . Comprehensive metabolic panel  . Lipid panel  . TSH  . Hemoglobin A1c  . Cardiovascular Risk Assessment    Follow-up: Return in about 3 months (around 06/24/2020) for fsting.  An After Visit Summary was printed and given to the patient.  Blane Ohara, MD [facility] [phone]

## 2020-03-26 ENCOUNTER — Ambulatory Visit (INDEPENDENT_AMBULATORY_CARE_PROVIDER_SITE_OTHER): Payer: Medicaid Other | Admitting: Family Medicine

## 2020-03-26 ENCOUNTER — Encounter: Payer: Self-pay | Admitting: Family Medicine

## 2020-03-26 ENCOUNTER — Other Ambulatory Visit: Payer: Self-pay

## 2020-03-26 VITALS — BP 106/60 | HR 92 | Temp 97.3°F | Resp 18 | Ht 64.0 in | Wt 188.6 lb

## 2020-03-26 DIAGNOSIS — F330 Major depressive disorder, recurrent, mild: Secondary | ICD-10-CM

## 2020-03-26 DIAGNOSIS — Z23 Encounter for immunization: Secondary | ICD-10-CM

## 2020-03-26 DIAGNOSIS — E66811 Obesity, class 1: Secondary | ICD-10-CM

## 2020-03-26 DIAGNOSIS — Z6832 Body mass index (BMI) 32.0-32.9, adult: Secondary | ICD-10-CM

## 2020-03-26 DIAGNOSIS — G43809 Other migraine, not intractable, without status migrainosus: Secondary | ICD-10-CM

## 2020-03-26 DIAGNOSIS — F331 Major depressive disorder, recurrent, moderate: Secondary | ICD-10-CM

## 2020-03-26 DIAGNOSIS — F840 Autistic disorder: Secondary | ICD-10-CM

## 2020-03-26 DIAGNOSIS — F4000 Agoraphobia, unspecified: Secondary | ICD-10-CM

## 2020-03-26 DIAGNOSIS — E282 Polycystic ovarian syndrome: Secondary | ICD-10-CM

## 2020-03-26 DIAGNOSIS — E782 Mixed hyperlipidemia: Secondary | ICD-10-CM

## 2020-03-26 DIAGNOSIS — E6609 Other obesity due to excess calories: Secondary | ICD-10-CM

## 2020-03-27 LAB — COMPREHENSIVE METABOLIC PANEL
ALT: 44 IU/L — ABNORMAL HIGH (ref 0–32)
AST: 21 IU/L (ref 0–40)
Albumin/Globulin Ratio: 2 (ref 1.2–2.2)
Albumin: 4.9 g/dL (ref 3.9–5.0)
Alkaline Phosphatase: 94 IU/L (ref 44–121)
BUN/Creatinine Ratio: 13 (ref 9–23)
BUN: 12 mg/dL (ref 6–20)
Bilirubin Total: 1.3 mg/dL — ABNORMAL HIGH (ref 0.0–1.2)
CO2: 19 mmol/L — ABNORMAL LOW (ref 20–29)
Calcium: 9.9 mg/dL (ref 8.7–10.2)
Chloride: 108 mmol/L — ABNORMAL HIGH (ref 96–106)
Creatinine, Ser: 0.93 mg/dL (ref 0.57–1.00)
GFR calc Af Amer: 99 mL/min/{1.73_m2} (ref >59)
GFR calc non Af Amer: 86 mL/min/{1.73_m2} (ref >59)
Globulin, Total: 2.4 g/dL (ref 1.5–4.5)
Glucose: 88 mg/dL (ref 65–99)
Potassium: 4.9 mmol/L (ref 3.5–5.2)
Sodium: 144 mmol/L (ref 134–144)
Total Protein: 7.3 g/dL (ref 6.0–8.5)

## 2020-03-27 LAB — CBC WITH DIFFERENTIAL/PLATELET
Basophils Absolute: 0 10*3/uL (ref 0.0–0.2)
Basos: 1 %
EOS (ABSOLUTE): 0.1 10*3/uL (ref 0.0–0.4)
Eos: 2 %
Hematocrit: 45 % (ref 34.0–46.6)
Hemoglobin: 15.4 g/dL (ref 11.1–15.9)
Immature Grans (Abs): 0 10*3/uL (ref 0.0–0.1)
Immature Granulocytes: 0 %
Lymphocytes Absolute: 3.5 10*3/uL — ABNORMAL HIGH (ref 0.7–3.1)
Lymphs: 43 %
MCH: 29.4 pg (ref 26.6–33.0)
MCHC: 34.2 g/dL (ref 31.5–35.7)
MCV: 86 fL (ref 79–97)
Monocytes Absolute: 0.6 10*3/uL (ref 0.1–0.9)
Monocytes: 7 %
Neutrophils Absolute: 3.9 10*3/uL (ref 1.4–7.0)
Neutrophils: 47 %
Platelets: 285 10*3/uL (ref 150–450)
RBC: 5.23 x10E6/uL (ref 3.77–5.28)
RDW: 12 % (ref 11.7–15.4)
WBC: 8.2 10*3/uL (ref 3.4–10.8)

## 2020-03-27 LAB — HEMOGLOBIN A1C
Est. average glucose Bld gHb Est-mCnc: 94 mg/dL
Hgb A1c MFr Bld: 4.9 % (ref 4.8–5.6)

## 2020-03-27 LAB — LIPID PANEL
Chol/HDL Ratio: 5.2 ratio — ABNORMAL HIGH (ref 0.0–4.4)
Cholesterol, Total: 176 mg/dL (ref 100–199)
HDL: 34 mg/dL — ABNORMAL LOW (ref >39)
LDL Chol Calc (NIH): 115 mg/dL — ABNORMAL HIGH (ref 0–99)
Triglycerides: 153 mg/dL — ABNORMAL HIGH (ref 0–149)
VLDL Cholesterol Cal: 27 mg/dL (ref 5–40)

## 2020-03-27 LAB — CARDIOVASCULAR RISK ASSESSMENT

## 2020-03-27 LAB — TSH: TSH: 1.81 u[IU]/mL (ref 0.450–4.500)

## 2020-04-10 ENCOUNTER — Telehealth: Payer: Self-pay | Admitting: Family Medicine

## 2020-04-10 NOTE — Telephone Encounter (Signed)
Pt.'s mom is requesting a refill for EMGALITY 120 MG/ML SOAJ. She states [facility] needs a PA of this medication.

## 2020-04-10 NOTE — Telephone Encounter (Signed)
Completed Emgality PA on [employer] portal. Awaiting determination. Confirmation # [id number]

## 2020-04-11 NOTE — Telephone Encounter (Signed)
Per [employer], APPROVED 04/10/2020 - 04/05/2021.

## 2020-05-17 ENCOUNTER — Other Ambulatory Visit: Payer: Self-pay

## 2020-05-17 ENCOUNTER — Ambulatory Visit: Payer: Medicaid Other | Admitting: Podiatry

## 2020-05-17 ENCOUNTER — Encounter: Payer: Self-pay | Admitting: Podiatry

## 2020-05-17 DIAGNOSIS — L600 Ingrowing nail: Secondary | ICD-10-CM | POA: Diagnosis not present

## 2020-05-17 DIAGNOSIS — M79676 Pain in unspecified toe(s): Secondary | ICD-10-CM

## 2020-05-17 NOTE — Progress Notes (Signed)
  Subjective:  Patient ID: Betty Rogers, female    DOB: [birth year],  MRN: [id number]  Chief Complaint  Patient presents with  . Nail Problem    Pt has concern with the skin around right 2nd toenail- mentioned she tries to trim the skin off herself- no improvement     25 y.o. female presents with the above complaint. History confirmed with patient.   Objective:  Physical Exam: warm, good capillary refill, no trophic changes or ulcerative lesions, normal DP and PT pulses and normal sensory exam.  Painful ingrowing nail at  medial border of the right, 2nd toe; without warmth, erythema or drainage  Assessment:  No diagnosis found.   Plan:  Patient was evaluated and treated and all questions answered.  Ingrown Nail, right -Palliative debridement of ingrowing nails in slant-back fashion to patient relief.   Pes Planus -Dispensed PowerStep inserts. Advised of non-coverage. ABN filled out and signed.  Return if symptoms worsen or fail to improve.

## 2020-08-20 ENCOUNTER — Ambulatory Visit (INDEPENDENT_AMBULATORY_CARE_PROVIDER_SITE_OTHER): Payer: Medicaid Other | Admitting: Physician Assistant

## 2020-08-20 ENCOUNTER — Encounter: Payer: Self-pay | Admitting: Physician Assistant

## 2020-08-20 VITALS — BP 110/70 | HR 96 | Temp 97.9°F | Ht 64.0 in | Wt 190.6 lb

## 2020-08-20 DIAGNOSIS — H66002 Acute suppurative otitis media without spontaneous rupture of ear drum, left ear: Secondary | ICD-10-CM | POA: Insufficient documentation

## 2020-08-20 DIAGNOSIS — J029 Acute pharyngitis, unspecified: Secondary | ICD-10-CM | POA: Diagnosis not present

## 2020-08-20 LAB — POCT RAPID STREP A: Strep A Ag: NOT DETECTED

## 2020-08-20 LAB — POC COVID19 BINAXNOW: SARS Coronavirus 2 Ag: NEGATIVE

## 2020-08-20 MED ORDER — CEFDINIR 300 MG PO CAPS: 300.0000 mg | ORAL_CAPSULE | Freq: Two times a day (BID) | ORAL | 0 refills | Status: DC

## 2020-08-20 NOTE — Progress Notes (Signed)
Acute Office Visit  Subjective:    Patient ID: Betty Rogers, female    DOB: 1995/08/26, 25 y.o.   MRN: [id number]  Chief Complaint  Patient presents with   Ear Pain   Sore Throat    HPI Patient is in today for sore throat and earache - states started 3 days ago - has not had a cough or fever  No one else in house sick   Past Medical History:  Diagnosis Date   Anxiety and depression    Migraines    Panic attacks     Past Surgical History:  Procedure Laterality Date   CHOLECYSTECTOMY     CHOLECYSTECTOMY     ESOPHAGOGASTRODUODENOSCOPY     REMOVAL BIRTH MARK ON HEAD   2008    Family History  Problem Relation Age of Onset   Heart attack Other    Cancer Other     Social History   Socioeconomic History   Marital status: Single    Spouse name: Not on file   Number of children: Not on file   Years of education: Not on file   Highest education level: High school graduate  Occupational History   Not on file  Tobacco Use   Smoking status: Never   Smokeless tobacco: Never  Vaping Use   Vaping Use: Never used  Substance and Sexual Activity   Alcohol use: Yes    Comment: "a half a wine cooler occasionally"   Drug use: Never   Sexual activity: Not Currently    Birth control/protection: Abstinence, Injection  Other Topics Concern   Not on file  Social History Narrative   Lives at home with her parents   Right handed   No caffeine   Social Determinants of Corporate investment banker Strain: Not on file  Food Insecurity: Not on file  Transportation Needs: Not on file  Physical Activity: Not on file  Stress: Not on file  Social Connections: Not on file  Intimate Partner Violence: Not on file    Outpatient Medications Prior to Visit  Medication Sig Dispense Refill   ARIPiprazole (ABILIFY) 5 MG tablet Take 5 mg by mouth at bedtime.     busPIRone (BUSPAR) 10 MG tablet Take 10 mg by mouth 2 (two) times daily.     butalbital-acetaminophen-caffeine  (FIORICET, ESGIC) 50-325-40 MG tablet Take 1-2 tablets by mouth every 4 (four) hours as needed for headache. No more than 6 per day     dicyclomine (BENTYL) 20 MG tablet Take 10 mg by mouth 4 (four) times daily -  before meals and at bedtime.      divalproex (DEPAKOTE ER) 250 MG 24 hr tablet Take 250 mg by mouth at bedtime.     EMGALITY 120 MG/ML SOAJ INJECT 120MG  INTO THE SKIN EVERY 30 DAYS 1 mL 11   FLUoxetine (PROZAC) 20 MG capsule Take 1 capsule (20 mg total) by mouth daily. 30 capsule 0   hydrOXYzine (VISTARIL) 25 MG capsule Take 25 mg by mouth at bedtime.     LORazepam (ATIVAN) 1 MG tablet Take 1 mg by mouth 2 (two) times daily.     medroxyPROGESTERone Acetate (DEPO-PROVERA IM) Inject into the muscle every 3 (three) months.     omeprazole (PRILOSEC) 20 MG capsule Take 20 mg by mouth 2 (two) times daily.     prochlorperazine (COMPAZINE) 10 MG tablet Take 10 mg by mouth 3 (three) times daily.     propranolol ER (INDERAL LA) 80 MG 24  hr capsule TAKE 1 CAPSULE BY MOUTH AT BEDTIME 30 capsule 11   rizatriptan (MAXALT-MLT) 10 MG disintegrating tablet Take 1 tablet (10 mg total) by mouth as needed for migraine. May repeat in 2 hours if needed 9 tablet 11   VITAMIN D PO Take 125 mcg by mouth daily.     No facility-administered medications prior to visit.    Allergies  Allergen Reactions   Amitriptyline     Worse headaches   Lac Bovis    Latex Rash   Nickel Rash   Other Itching    Metal     Review of Systems CONSTITUTIONAL: Negative for chills, fatigue, fever, unintentional weight gain and unintentional weight loss.  E/N/T: see HPI CARDIOVASCULAR: Negative for chest pain, dizziness, palpitations and pedal edema.  RESPIRATORY: Negative for recent cough and dyspnea.  GASTROINTESTINAL: Negative for abdominal pain, acid reflux symptoms, constipation, diarrhea, nausea and vomiting.  INTEGUMENTARY: Negative for rash.          Objective:    Physical Exam PHYSICAL EXAM:   VS: BP  110/70 (BP Location: Right Arm, Patient Position: Sitting, Cuff Size: Normal)   Pulse 96   Temp 97.9 F (36.6 C) (Temporal)   Ht 5\' 4"  (1.626 m)   Wt 190 lb 9.6 oz (86.5 kg)   SpO2 96%   BMI 32.72 kg/m   GEN: Well nourished, well developed, in no acute distress  HEENT: Right TM normal - left TM red and inflamed Oropharynx - erythema and PND Cardiac: RRR; no murmurs, rubs, or gallops,no edema -  Respiratory:  normal respiratory rate and pattern with no distress - normal breath sounds with no rales, rhonchi, wheezes or rubs Skin: warm and dry, no rash    BP 110/70 (BP Location: Right Arm, Patient Position: Sitting, Cuff Size: Normal)   Pulse 96   Temp 97.9 F (36.6 C) (Temporal)   Ht 5\' 4"  (1.626 m)   Wt 190 lb 9.6 oz (86.5 kg)   SpO2 96%   BMI 32.72 kg/m  Wt Readings from Last 3 Encounters:  08/20/20 190 lb 9.6 oz (86.5 kg)  03/26/20 188 lb 9.6 oz (85.5 kg)  11/30/19 182 lb (82.6 kg)    Health Maintenance Due  Topic Date Due   HPV VACCINES (1 - 2-dose series) Never done   HIV Screening  Never done   Hepatitis C Screening  Never done   PAP-Cervical Cytology Screening  Never done   PAP SMEAR-Modifier  Never done   COVID-19 Vaccine (3 - Booster for Pfizer series) 02/23/2020       Topic Date Due   HPV VACCINES (1 - 2-dose series) Never done     Lab Results  Component Value Date   TSH 1.810 03/26/2020   Lab Results  Component Value Date   WBC 8.2 03/26/2020   HGB 15.4 03/26/2020   HCT 45.0 03/26/2020   MCV 86 03/26/2020   PLT 285 03/26/2020   Lab Results  Component Value Date   NA 144 03/26/2020   K 4.9 03/26/2020   CO2 19 (L) 03/26/2020   GLUCOSE 88 03/26/2020   BUN 12 03/26/2020   CREATININE 0.93 03/26/2020   BILITOT 1.3 (H) 03/26/2020   ALKPHOS 94 03/26/2020   AST 21 03/26/2020   ALT 44 (H) 03/26/2020   PROT 7.3 03/26/2020   ALBUMIN 4.9 03/26/2020   CALCIUM 9.9 03/26/2020   ANIONGAP 13 07/18/2017   Lab Results  Component Value Date   CHOL  176 03/26/2020  Lab Results  Component Value Date   HDL 34 (L) 03/26/2020   Lab Results  Component Value Date   LDLCALC 115 (H) 03/26/2020   Lab Results  Component Value Date   TRIG 153 (H) 03/26/2020   Lab Results  Component Value Date   CHOLHDL 5.2 (H) 03/26/2020   Lab Results  Component Value Date   HGBA1C 4.9 03/26/2020       Assessment & Plan:  1. Pharyngitis, unspecified etiology - POC COVID-19 BinaxNow - POCT Rapid Strep A - cefdinir (OMNICEF) 300 MG capsule; Take 1 capsule (300 mg total) by mouth 2 (two) times daily.  Dispense: 20 capsule; Refill: 0  2. Non-recurrent acute suppurative otitis media of left ear without spontaneous rupture of tympanic membrane  As above Follow up if symptoms persist or worsen  Meds ordered this encounter  Medications   cefdinir (OMNICEF) 300 MG capsule    Sig: Take 1 capsule (300 mg total) by mouth 2 (two) times daily.    Dispense:  20 capsule    Refill:  0    Order Specific Question:   Supervising Provider    AnswerCorey Harold    Orders Placed This Encounter  Procedures   POC COVID-19 BinaxNow      Follow-up: No follow-ups on file.  An After Visit Summary was printed and given to the patient.  Jettie Pagan [facility] [phone]

## 2020-08-24 ENCOUNTER — Other Ambulatory Visit: Payer: Self-pay | Admitting: Physician Assistant

## 2020-08-24 ENCOUNTER — Telehealth: Payer: Self-pay

## 2020-08-24 MED ORDER — PREDNISONE 20 MG PO TABS: ORAL_TABLET | ORAL | 0 refills | Status: AC

## 2020-08-24 NOTE — Telephone Encounter (Signed)
Pt's mother calling states pt has had no change since Monday. States pt is still complaining of ST, bilateral ear pain, and it is hard to swallow. Denies n/v, congestion. States there is no change in symptoms or progress. Mother is questioning if anything more can be done before weekend.   Cefdinir twice a day for 10 days was given.   Lorita Officer, CCMA 08/24/20 10:20 AM

## 2020-08-24 NOTE — Telephone Encounter (Signed)
Continue the antibiotics and I will call in prednisone taper which should also help with symptoms

## 2020-08-24 NOTE — Telephone Encounter (Signed)
Made mother aware.   Lorita Officer, CCMA 08/24/20 11:05 AM

## 2020-11-01 ENCOUNTER — Telehealth: Payer: Self-pay | Admitting: Family Medicine

## 2020-11-01 NOTE — Telephone Encounter (Signed)
Pt's mother would like to speak to the RN regarding her EMGALITY 120 MG/ML SOAJ She states that it will be expiring in Oct and she would like to know if it will automatically be renewed or does the pt needs to be seen for it to be renewed. Please advise.

## 2020-11-01 NOTE — Telephone Encounter (Signed)
Pt last seen 11/15/19 and instructed to follow up in a year. Has no appt scheduled. She has to be seen yearly for continued refills/authorizations.

## 2020-11-01 NOTE — Telephone Encounter (Signed)
Called mother. Scheduled yearly w/ AL,NP 12/11/20 at 830am. Requested VV. Pt stable, no new sx.

## 2020-12-05 NOTE — Telephone Encounter (Signed)
Amy is taking off 10/11. I lvm for patient of appt change to 10/25 at 7:45am which was the next available.

## 2020-12-11 ENCOUNTER — Telehealth: Payer: Medicaid Other | Admitting: Family Medicine

## 2020-12-16 ENCOUNTER — Encounter: Payer: Self-pay | Admitting: Family Medicine

## 2020-12-17 ENCOUNTER — Ambulatory Visit (INDEPENDENT_AMBULATORY_CARE_PROVIDER_SITE_OTHER): Payer: Medicaid Other

## 2020-12-17 ENCOUNTER — Ambulatory Visit: Payer: Medicaid Other | Admitting: Family Medicine

## 2020-12-17 ENCOUNTER — Encounter: Payer: Self-pay | Admitting: Family Medicine

## 2020-12-17 ENCOUNTER — Other Ambulatory Visit: Payer: Self-pay

## 2020-12-17 VITALS — BP 122/68 | HR 97 | Temp 96.5°F | Ht 64.0 in | Wt 198.0 lb

## 2020-12-17 DIAGNOSIS — F330 Major depressive disorder, recurrent, mild: Secondary | ICD-10-CM | POA: Diagnosis not present

## 2020-12-17 DIAGNOSIS — F840 Autistic disorder: Secondary | ICD-10-CM

## 2020-12-17 DIAGNOSIS — E6609 Other obesity due to excess calories: Secondary | ICD-10-CM

## 2020-12-17 DIAGNOSIS — Z79899 Other long term (current) drug therapy: Secondary | ICD-10-CM

## 2020-12-17 DIAGNOSIS — Z23 Encounter for immunization: Secondary | ICD-10-CM | POA: Diagnosis not present

## 2020-12-17 DIAGNOSIS — E282 Polycystic ovarian syndrome: Secondary | ICD-10-CM | POA: Diagnosis not present

## 2020-12-17 DIAGNOSIS — G43709 Chronic migraine without aura, not intractable, without status migrainosus: Secondary | ICD-10-CM

## 2020-12-17 DIAGNOSIS — Z6833 Body mass index (BMI) 33.0-33.9, adult: Secondary | ICD-10-CM

## 2020-12-17 DIAGNOSIS — F410 Panic disorder [episodic paroxysmal anxiety] without agoraphobia: Secondary | ICD-10-CM

## 2020-12-17 MED ORDER — METFORMIN HCL 500 MG PO TABS: 500.0000 mg | ORAL_TABLET | Freq: Every day | ORAL | 0 refills | Status: DC

## 2020-12-17 NOTE — Progress Notes (Signed)
Subjective:  Patient ID: Betty Rogers, female    DOB: 02/13/1996  Age: 25 y.o. MRN: [id number]  Chief Complaint  Patient presents with   Gastroesophageal Reflux   Anxiety     HPI GERD- Prilosec 20 mg one twice a day Anxiety/Depression-Hydroxyzine, Buspar, Prozac. Sees psychiatry. Migraines-Emgality, Depakote, Maxalt. Takes a fiorecet twice a month.  Pt is working on weight loss. She has seen Eunice Blase, NP for wt mgmt.  Eating healthy. Has lost weight. Greta asked that she ask her pcp, me, to give her metformin for PCOS.  Current Outpatient Medications on File Prior to Visit  Medication Sig Dispense Refill   Diethylpropion HCl 25 MG TABS Take 25 mg by mouth daily.     ARIPiprazole (ABILIFY) 5 MG tablet Take 5 mg by mouth at bedtime.     busPIRone (BUSPAR) 10 MG tablet Take 10 mg by mouth 2 (two) times daily.     butalbital-acetaminophen-caffeine (FIORICET, ESGIC) 50-325-40 MG tablet Take 1-2 tablets by mouth every 4 (four) hours as needed for headache. No more than 6 per day     dicyclomine (BENTYL) 20 MG tablet Take 10 mg by mouth 4 (four) times daily -  before meals and at bedtime.      divalproex (DEPAKOTE ER) 250 MG 24 hr tablet Take 250 mg by mouth at bedtime.     FLUoxetine (PROZAC) 20 MG capsule Take 1 capsule (20 mg total) by mouth daily. 30 capsule 0   hydrOXYzine (VISTARIL) 25 MG capsule Take 25 mg by mouth at bedtime.     LORazepam (ATIVAN) 1 MG tablet Take 1 mg by mouth 2 (two) times daily.     medroxyPROGESTERone Acetate (DEPO-PROVERA IM) Inject into the muscle every 3 (three) months.     omeprazole (PRILOSEC) 20 MG capsule Take 20 mg by mouth 2 (two) times daily.     prochlorperazine (COMPAZINE) 10 MG tablet Take 10 mg by mouth 3 (three) times daily.     VITAMIN D PO Take 125 mcg by mouth daily.     No current facility-administered medications on file prior to visit.   Past Medical History:  Diagnosis Date   Agoraphobia 11/07/2016   Anxiety and depression     History of anxiety 03/29/2015   History of attention deficit hyperactivity disorder (ADHD) 03/29/2015   History of depression 03/29/2015   Migraines    Panic attacks    Psychosis (HCC) 07/18/2017   Past Surgical History:  Procedure Laterality Date   CHOLECYSTECTOMY     CHOLECYSTECTOMY     ESOPHAGOGASTRODUODENOSCOPY     REMOVAL BIRTH MARK ON HEAD   2008    Family History  Problem Relation Age of Onset   Heart attack Other    Cancer Other    Social History   Socioeconomic History   Marital status: Single    Spouse name: Not on file   Number of children: Not on file   Years of education: Not on file   Highest education level: High school graduate  Occupational History   Not on file  Tobacco Use   Smoking status: Never   Smokeless tobacco: Never  Vaping Use   Vaping Use: Never used  Substance and Sexual Activity   Alcohol use: Yes    Comment: "a half a wine cooler occasionally"   Drug use: Never   Sexual activity: Not Currently    Birth control/protection: Abstinence, Injection  Other Topics Concern   Not on file  Social History Narrative  Lives at home with her parents   Right handed   No caffeine   Social Determinants of Health   Financial Resource Strain: Not on file  Food Insecurity: Not on file  Transportation Needs: Not on file  Physical Activity: Not on file  Stress: Not on file  Social Connections: Not on file    Review of Systems  Constitutional:  Negative for chills, fatigue and fever.  HENT:  Negative for congestion, ear pain, rhinorrhea and sore throat.   Respiratory:  Negative for cough and shortness of breath.   Cardiovascular:  Negative for chest pain.  Gastrointestinal:  Negative for abdominal pain, constipation, diarrhea, nausea and vomiting.  Genitourinary:  Negative for dysuria and urgency.  Musculoskeletal:  Negative for back pain and myalgias.  Neurological:  Negative for dizziness, weakness, light-headedness and headaches.   Psychiatric/Behavioral:  Negative for dysphoric mood. The patient is not nervous/anxious.     Objective:  BP 122/68   Pulse 97   Temp (!) 96.5 F (35.8 C)   Ht 5\' 4"  (1.626 m)   Wt 198 lb (89.8 kg)   SpO2 97%   BMI 33.99 kg/m   BP/Weight 12/17/2020 08/20/2020 03/26/2020  Systolic BP 122 110 106  Diastolic BP 68 70 60  Wt. (Lbs) 198 190.6 188.6  BMI 33.99 32.72 32.37  Some encounter information is confidential and restricted. Go to Review Flowsheets activity to see all data.    Physical Exam Vitals reviewed.  Constitutional:      Appearance: Normal appearance. She is obese.  Neck:     Vascular: No carotid bruit.  Cardiovascular:     Rate and Rhythm: Normal rate and regular rhythm.     Pulses: Normal pulses.     Heart sounds: Normal heart sounds.  Pulmonary:     Effort: Pulmonary effort is normal. No respiratory distress.     Breath sounds: Normal breath sounds.  Abdominal:     General: Abdomen is flat. Bowel sounds are normal.     Palpations: Abdomen is soft.     Tenderness: There is no abdominal tenderness.  Neurological:     Mental Status: She is alert and oriented to person, place, and time.  Psychiatric:        Mood and Affect: Mood normal.        Behavior: Behavior normal.    Diabetic Foot Exam - Simple   No data filed      Lab Results  Component Value Date   WBC 8.8 12/17/2020   HGB 16.3 (H) 12/17/2020   HCT 47.8 (H) 12/17/2020   PLT 278 12/17/2020   GLUCOSE 90 12/17/2020   CHOL 198 12/17/2020   TRIG 169 (H) 12/17/2020   HDL 33 (L) 12/17/2020   LDLCALC 134 (H) 12/17/2020   ALT 29 12/17/2020   AST 26 12/17/2020   NA 142 12/17/2020   K 4.8 12/17/2020   CL 107 (H) 12/17/2020   CREATININE 0.86 12/17/2020   BUN 10 12/17/2020   CO2 21 12/17/2020   TSH 2.240 12/17/2020   HGBA1C 5.2 12/17/2020      Assessment & Plan:   Problem List Items Addressed This Visit       Cardiovascular and Mediastinum   Chronic migraine without aura, not  intractable    Continue emgality, Depakote, maxalt, fiorecet.        Endocrine   PCOS (polycystic ovarian syndrome)    Start on metformin 500 mg once daily.      Relevant Orders  Hemoglobin A1c (Completed)     Other   Autism spectrum disorder - Primary    Management per specialist. Psychiatry       Depression, major, recurrent, mild (HCC)    Mgmt per psychiatry.      Class 1 obesity due to excess calories without serious comorbidity with body mass index (BMI) of 33.0 to 33.9 in adult    Recommend continue to work on eating healthy diet and exercise.       Relevant Medications   Diethylpropion HCl 25 MG TABS   metFORMIN (GLUCOPHAGE) 500 MG tablet   Other Relevant Orders   Lipid panel (Completed)   TSH (Completed)   Other Visit Diagnoses     Pharmacologic therapy       Relevant Orders   CBC with Differential/Platelet (Completed)   Comprehensive metabolic panel (Completed)     .  Meds ordered this encounter  Medications   metFORMIN (GLUCOPHAGE) 500 MG tablet    Sig: Take 1 tablet (500 mg total) by mouth daily with breakfast.    Dispense:  90 tablet    Refill:  0     Orders Placed This Encounter  Procedures   CBC with Differential/Platelet   Comprehensive metabolic panel   Lipid panel   TSH   Hemoglobin A1c      Follow-up: Return in about 3 months (around 03/19/2021).  An After Visit Summary was printed and given to the patient.  Blane Ohara, MD [facility] [phone]

## 2020-12-17 NOTE — Progress Notes (Signed)
   Covid-19 Vaccination Clinic  Name:  Betty Rogers    MRN: [id number] DOB: 09-20-1995  12/17/2020  [name] was observed post Covid-19 immunization for 15 minutes without incident. She was provided with Vaccine Information Sheet and instruction to access the V-Safe system.   [name] was instructed to call 911 with any severe reactions post vaccine: Difficulty breathing  Swelling of face and throat  A fast heartbeat  A bad rash all over body  Dizziness and weakness

## 2020-12-18 LAB — COMPREHENSIVE METABOLIC PANEL
ALT: 29 IU/L (ref 0–32)
AST: 26 IU/L (ref 0–40)
Albumin/Globulin Ratio: 2.1 (ref 1.2–2.2)
Albumin: 4.6 g/dL (ref 3.9–5.0)
Alkaline Phosphatase: 82 IU/L (ref 44–121)
BUN/Creatinine Ratio: 12 (ref 9–23)
BUN: 10 mg/dL (ref 6–20)
Bilirubin Total: 1.5 mg/dL — ABNORMAL HIGH (ref 0.0–1.2)
CO2: 21 mmol/L (ref 20–29)
Calcium: 9.8 mg/dL (ref 8.7–10.2)
Chloride: 107 mmol/L — ABNORMAL HIGH (ref 96–106)
Creatinine, Ser: 0.86 mg/dL (ref 0.57–1.00)
Globulin, Total: 2.2 g/dL (ref 1.5–4.5)
Glucose: 90 mg/dL (ref 70–99)
Potassium: 4.8 mmol/L (ref 3.5–5.2)
Sodium: 142 mmol/L (ref 134–144)
Total Protein: 6.8 g/dL (ref 6.0–8.5)
eGFR: 95 mL/min/{1.73_m2} (ref >59)

## 2020-12-18 LAB — CBC WITH DIFFERENTIAL/PLATELET
Basophils Absolute: 0 10*3/uL (ref 0.0–0.2)
Basos: 0 %
EOS (ABSOLUTE): 0.1 10*3/uL (ref 0.0–0.4)
Eos: 1 %
Hematocrit: 47.8 % — ABNORMAL HIGH (ref 34.0–46.6)
Hemoglobin: 16.3 g/dL — ABNORMAL HIGH (ref 11.1–15.9)
Immature Grans (Abs): 0 10*3/uL (ref 0.0–0.1)
Immature Granulocytes: 1 %
Lymphocytes Absolute: 3.5 10*3/uL — ABNORMAL HIGH (ref 0.7–3.1)
Lymphs: 40 %
MCH: 29.5 pg (ref 26.6–33.0)
MCHC: 34.1 g/dL (ref 31.5–35.7)
MCV: 87 fL (ref 79–97)
Monocytes Absolute: 0.7 10*3/uL (ref 0.1–0.9)
Monocytes: 8 %
Neutrophils Absolute: 4.4 10*3/uL (ref 1.4–7.0)
Neutrophils: 50 %
Platelets: 278 10*3/uL (ref 150–450)
RBC: 5.52 x10E6/uL — ABNORMAL HIGH (ref 3.77–5.28)
RDW: 12.7 % (ref 11.7–15.4)
WBC: 8.8 10*3/uL (ref 3.4–10.8)

## 2020-12-18 LAB — LIPID PANEL
Chol/HDL Ratio: 6 ratio — ABNORMAL HIGH (ref 0.0–4.4)
Cholesterol, Total: 198 mg/dL (ref 100–199)
HDL: 33 mg/dL — ABNORMAL LOW (ref >39)
LDL Chol Calc (NIH): 134 mg/dL — ABNORMAL HIGH (ref 0–99)
Triglycerides: 169 mg/dL — ABNORMAL HIGH (ref 0–149)
VLDL Cholesterol Cal: 31 mg/dL (ref 5–40)

## 2020-12-18 LAB — HEMOGLOBIN A1C
Est. average glucose Bld gHb Est-mCnc: 103 mg/dL
Hgb A1c MFr Bld: 5.2 % (ref 4.8–5.6)

## 2020-12-18 LAB — TSH: TSH: 2.24 u[IU]/mL (ref 0.450–4.500)

## 2020-12-24 NOTE — Progress Notes (Signed)
PATIENT: Betty Rogers DOB: March 02, 1996  REASON FOR VISIT: follow up HISTORY FROM: patient  Virtual Visit via Telephone Note  I connected with Betty Rogers on 12/24/20 at  7:45 AM EDT by telephone and verified that I am speaking with the correct person using two identifiers.   I discussed the limitations, risks, security and privacy concerns of performing an evaluation and management service by telephone and the availability of in person appointments. I also discussed with the patient that there may be a patient responsible charge related to this service. The patient expressed understanding and agreed to proceed.   History of Present Illness:  12/24/20 ALL: Betty Rogers returns for migraine follow up. She feels that she is doing fairly well. She may have 3-4 migraines per month. She continues Emgality and propranolol. Rizatriptan helps with abortive therapy but makes her sleepy. She may use 3-4 per month. Advil helps with milder headaches. She recently quit her job due to panic attacks and increased migraines. She feels that she is doing better now.   11/15/2019 ALL:  Betty Rogers is a 25 y.o. female here today for follow up for migraines.  She continues Emgality monthly and propranolol every day at bedtime.  She reports that she is doing very well.  She may have 2 migraine days per month.  Rizatriptan helps to abort migraines.  She is feeling well and without concerns.  She continues an adult camp throughout the week.  Mother is primary caregiver.   History (copied from my note on 05/10/2019)  Betty Rogers is a 25 y.o. female here today for follow up for migraines.  She presents today with her mother, who aids in history via MyChart video.  Betty Rogers reports that she is doing well.  Headaches have improved.  She continues Emgality monthly.  She is also taking propranolol 80 mg daily at bedtime.  She reports that, on average, she has about 4 headache days per month.  She has been using  rizatriptan for abortive therapy that works well.  She continues to note grogginess after taking triptan therapy but feels that it helps significantly with migraine abortion.  No other side effects noted.  She continues to follow with Starling Manns every 3 months.  Mom reports that tremors have improved as well.   Meds tried: Amovig, Emgality,Topamax, Seroquel, Prozac, Depakote, Vistaril, Ativan, buspar, fioricet, compazine, imitrex, Zofran, propranolol   History (copied from my note on 02/01/2019)   Betty Rogers is a 25 y.o. female here today for follow up for migraines. She was recently switched from Amovig to [employer]. She has taken two doses. Last dose given 11/29. She continues propranolol 80mg  at bedtime for tremor and migraine prevention. Tremor is unchanged. She has had a migraine for the past 6 days. Unilateral pounding and pressure with sound sensitivity. No nausea or vomiting. She also notes bilateral ear pressure. She has taken a total of 4 sumatriptan 100mg  tablets over the past week with no significant improvement in symptoms. She feels that sumatriptan makes her really groggy. She is seen by , psychiatry, on multiple medications for management of mood. Sleep has not changed. Mother feels that medications are causing increased sleepiness.    History (copied from Dr note on 12/20/2018)    Interval history She is having 3 migraines a month, not doing as well on the Aimovig, discussed immunogenicity will try switching to Ajovy or Erenumab. Will give a refill for Imitrex.    Interval history 07/12/2018: Fantastic response to the  Aimovig, last headache last month last migraine months ago. Continue the Aimovig. More shaking, hands and feet. Husband has shaking, more in the hands. No side effects to the propranol. Will increase propranolol to once at night. Discussed medications she is on can cause tremor but may also be a component of essential tremor. Difficulty with handwriting  and eating.    Interval history 10/26/2017:  We started propranolol, she has 15 headaches days a month and she feels improved on the propranolol but still having 8 migraine days a month. But she feels the Aimovig is helping so much she is doing well. Lately she has been having tremors, in the hands when she picks up things. She is very shaky. TSH normal. Tremors started 2 months ago.     HPI:  Betty Rogers is a 25 y.o. female here as a referral from Dr. Sedalia Muta for migraines. PMHx anxiety, depression, psychosis, panic attacks, migraine, autism spectrum d/o.  She is on the Aimovig autoinjector once a month. Patient is here with migraines who provide mot information. Started 5 years ago. Mother with migraines. Theys tart in the back of the head, can be unilateral, sharp and throbbing, +light and +sound sensitivity, +nausea, no vomiting. Sometimes she gets blurry an it is hard for her to read. She has ringing in her ears. Has  A lot of neck pain and tightness in her. Daily continuous headaches for over 6 months. The migraines last all day 24 hours. They start right before bed, can be positionally worse. 1/2 the month is migrainous. No medication overuse. She is on Aimovig monthly. She is on 140mg  monthly. Sleep apnea test was negative.    Meds tried: Topamax, Seroquel, Prozac, Depakote, Vistaril, Ativan, buspar, fioricet, compazine, imitrex, Zofran, propranolol   Reviewed notes, labs and imaging from outside physicians, which showed:   MRI brain 07/22/2017: showed No acute intracranial abnormalities including mass lesion or mass effect, hydrocephalus, extra-axial fluid collection, midline shift, hemorrhage, or acute infarction, large ischemic events (personally reviewed images)   Tsh,hgba1c unremarkable   Observations/Objective:  Generalized: Well developed, in no acute distress  Mentation: Alert oriented to time, place, history taking. Follows all commands speech and language fluent   Assessment and  Plan:  25 y.o. year old female  has a past medical history of Agoraphobia (11/07/2016), Anxiety and depression, History of anxiety (03/29/2015), History of attention deficit hyperactivity disorder (ADHD) (03/29/2015), History of depression (03/29/2015), Migraines, Panic attacks, and Psychosis (HCC) (07/18/2017). here with  No diagnosis found.   Betty Rogers is doing very well with current treatment plan.  We will continue Emgality every 30 days, propranolol 80 mg daily at bedtime and rizatriptan as needed. She will call me if headaches worsen. Healthy lifestyle habits encouraged.  She will follow-up with me in 1 year, sooner if needed.  She and her mother Rogers verbalized understanding and agreement with this plan.  No orders of the defined types were placed in this encounter.   No orders of the defined types were placed in this encounter.    Follow Up Instructions:  I discussed the assessment and treatment plan with the patient. The patient was provided an opportunity to ask questions and all were answered. The patient agreed with the plan and demonstrated an understanding of the instructions.   The patient was advised to call back or seek an in-person evaluation if the symptoms worsen or if the condition fails to improve as anticipated.  I provided 15 minutes of non-face-to-face time during this encounter.  Patient and her mother are located at their place of residence during my chart visit.  Video call not able to be completed as log in time 8:22 for 7:45 appt. Televisit completed. Provider is in the office.   Shawnie Dapper, NP

## 2020-12-24 NOTE — Patient Instructions (Signed)
Below is our plan:  We will continue Emgality and propranolol for migraine prevention. Continue rizatriptan for abortive therapy.   Please make sure you are staying well hydrated. I recommend 50-60 ounces daily. Well balanced diet and regular exercise encouraged. Consistent sleep schedule with 6-8 hours recommended.   Please continue follow up with care team as directed.   Follow up with me in 1 year  You may receive a survey regarding today's visit. I encourage you to leave honest feed back as I do use this information to improve patient care. Thank you for seeing me today!

## 2020-12-25 ENCOUNTER — Telehealth (INDEPENDENT_AMBULATORY_CARE_PROVIDER_SITE_OTHER): Payer: Medicaid Other | Admitting: Family Medicine

## 2020-12-25 ENCOUNTER — Encounter: Payer: Self-pay | Admitting: Family Medicine

## 2020-12-25 DIAGNOSIS — R251 Tremor, unspecified: Secondary | ICD-10-CM | POA: Diagnosis not present

## 2020-12-25 DIAGNOSIS — G43711 Chronic migraine without aura, intractable, with status migrainosus: Secondary | ICD-10-CM | POA: Diagnosis not present

## 2020-12-25 MED ORDER — PROPRANOLOL HCL ER 80 MG PO CP24: 80.0000 mg | ORAL_CAPSULE | Freq: Every day | ORAL | 3 refills | Status: DC

## 2020-12-25 MED ORDER — RIZATRIPTAN BENZOATE 10 MG PO TBDP: 10.0000 mg | ORAL_TABLET | ORAL | 11 refills | Status: DC | PRN

## 2020-12-25 MED ORDER — EMGALITY 120 MG/ML ~~LOC~~ SOAJ: SUBCUTANEOUS | 3 refills | Status: DC

## 2020-12-30 ENCOUNTER — Encounter: Payer: Self-pay | Admitting: Family Medicine

## 2020-12-30 DIAGNOSIS — Z6833 Body mass index (BMI) 33.0-33.9, adult: Secondary | ICD-10-CM | POA: Insufficient documentation

## 2020-12-30 DIAGNOSIS — E6609 Other obesity due to excess calories: Secondary | ICD-10-CM | POA: Insufficient documentation

## 2020-12-30 NOTE — Assessment & Plan Note (Signed)
Recommend continue to work on eating healthy diet and exercise.  

## 2020-12-30 NOTE — Assessment & Plan Note (Signed)
Start on metformin 500 mg once daily.

## 2020-12-30 NOTE — Assessment & Plan Note (Signed)
>>  ASSESSMENT AND PLAN FOR CHRONIC MIGRAINE WITHOUT AURA, NOT INTRACTABLE WRITTEN ON 12/30/2020 11:07 PM BY COX, KIRSTEN, MD  Continue emgality , Depakote , maxalt , fiorecet.

## 2020-12-30 NOTE — Assessment & Plan Note (Signed)
Management per specialist. Psychiatry.  

## 2020-12-30 NOTE — Assessment & Plan Note (Signed)
Mgmt per psychiatry.

## 2020-12-30 NOTE — Assessment & Plan Note (Signed)
Continue emgality, Depakote, maxalt, fiorecet.

## 2020-12-31 ENCOUNTER — Other Ambulatory Visit: Payer: Self-pay

## 2020-12-31 ENCOUNTER — Encounter: Payer: Self-pay | Admitting: Podiatry

## 2020-12-31 ENCOUNTER — Ambulatory Visit: Payer: Medicaid Other | Admitting: Podiatry

## 2020-12-31 DIAGNOSIS — L600 Ingrowing nail: Secondary | ICD-10-CM

## 2020-12-31 DIAGNOSIS — M79676 Pain in unspecified toe(s): Secondary | ICD-10-CM

## 2020-12-31 NOTE — Progress Notes (Signed)
  Subjective:  Patient ID: Betty Rogers, female    DOB: 06-10-1995,  MRN: [id number]  Chief Complaint  Patient presents with   Nail Problem    Rt hallux medial border x 3 wks- no pain unless bump it - worse with tight hsoes tx: trimming    25 y.o. female presents with the above complaint. History confirmed with patient.   Objective:  Physical Exam: warm, good capillary refill, no trophic changes or ulcerative lesions, normal DP and PT pulses, and normal sensory exam.  Painful ingrowing nail at  medial border of the right, hallux; without warmth, erythema or drainage  Assessment:   1. Ingrown nail   2. Pain around toenail      Plan:  Patient was evaluated and treated and all questions answered.  Ingrown Nail, right -Palliative debridement of ingrowing nails in slant-back fashion to patient relief. Educated on care including cutting nails straight across to prevent recurrence as the nail had a slight hook from missing all of the nail.  No follow-ups on file.

## 2021-01-03 ENCOUNTER — Telehealth: Payer: Medicaid Other | Admitting: Family Medicine

## 2021-03-24 NOTE — Progress Notes (Deleted)
Subjective:  Patient ID: Betty Rogers, female    DOB: [birth year]  Age: 26 y.o. MRN: [id number]  No chief complaint on file.   HPI   Depression: Patient is taking Abilify 5 mg at bedtime, Buspar 10 mg BID, Fluoxetine 20 mg daily.  Anxiety: Hydroxyzine 25 mg at bedtime, Lorazepam 1 mg BID.  Migraine: Currently taking rizatriptan 10 mg PRN, Fioricet 1-2 tablet every 4 hours PRN, Emgality 120 mg/ml every 30 days.  Current Outpatient Medications on File Prior to Visit  Medication Sig Dispense Refill   ARIPiprazole (ABILIFY) 5 MG tablet Take 5 mg by mouth at bedtime.     busPIRone (BUSPAR) 10 MG tablet Take 10 mg by mouth 2 (two) times daily.     butalbital-acetaminophen-caffeine (FIORICET, ESGIC) 50-325-40 MG tablet Take 1-2 tablets by mouth every 4 (four) hours as needed for headache. No more than 6 per day     dicyclomine (BENTYL) 20 MG tablet Take 10 mg by mouth 4 (four) times daily -  before meals and at bedtime.      Diethylpropion HCl 25 MG TABS Take 25 mg by mouth daily.     divalproex (DEPAKOTE ER) 250 MG 24 hr tablet Take 250 mg by mouth at bedtime.     FLUoxetine (PROZAC) 20 MG capsule Take 1 capsule (20 mg total) by mouth daily. 30 capsule 0   Galcanezumab-gnlm (EMGALITY) 120 MG/ML SOAJ INJECT 120MG  INTO THE SKIN EVERY 30 DAYS 3 mL 3   hydrOXYzine (VISTARIL) 25 MG capsule Take 25 mg by mouth at bedtime.     LORazepam (ATIVAN) 1 MG tablet Take 1 mg by mouth 2 (two) times daily.     medroxyPROGESTERone Acetate (DEPO-PROVERA IM) Inject into the muscle every 3 (three) months.     metFORMIN (GLUCOPHAGE) 500 MG tablet Take 1 tablet (500 mg total) by mouth daily with breakfast. 90 tablet 0   omeprazole (PRILOSEC) 20 MG capsule Take 20 mg by mouth 2 (two) times daily.     prochlorperazine (COMPAZINE) 10 MG tablet Take 10 mg by mouth 3 (three) times daily.     propranolol ER (INDERAL LA) 80 MG 24 hr capsule Take 1 capsule (80 mg total) by mouth at bedtime. 90 capsule 3   rizatriptan  (MAXALT-MLT) 10 MG disintegrating tablet Take 1 tablet (10 mg total) by mouth as needed for migraine. May repeat in 2 hours if needed 9 tablet 11   VITAMIN D PO Take 125 mcg by mouth daily.     No current facility-administered medications on file prior to visit.   Past Medical History:  Diagnosis Date   Agoraphobia 11/07/2016   Anxiety and depression    History of anxiety 03/29/2015   History of attention deficit hyperactivity disorder (ADHD) 03/29/2015   History of depression 03/29/2015   Migraines    Panic attacks    Psychosis ([place]) 07/18/2017   Past Surgical History:  Procedure Laterality Date   CHOLECYSTECTOMY     CHOLECYSTECTOMY     ESOPHAGOGASTRODUODENOSCOPY     REMOVAL BIRTH MARK ON HEAD   2008    Family History  Problem Relation Age of Onset   Heart attack Other    Cancer Other    Social History   Socioeconomic History   Marital status: Single    Spouse name: Not on file   Number of children: Not on file   Years of education: Not on file   Highest education level: High school graduate  Occupational History   Not  on file  Tobacco Use   Smoking status: Never   Smokeless tobacco: Never  Vaping Use   Vaping Use: Never used  Substance and Sexual Activity   Alcohol use: Yes    Comment: "a half a wine cooler occasionally"   Drug use: Never   Sexual activity: Not Currently    Birth control/protection: Abstinence, Injection  Other Topics Concern   Not on file  Social History Narrative   Lives at home with her parents   Right handed   No caffeine   Social Determinants of Radio broadcast assistant Strain: Not on file  Food Insecurity: Not on file  Transportation Needs: Not on file  Physical Activity: Not on file  Stress: Not on file  Social Connections: Not on file    Review of Systems   Objective:  There were no vitals taken for this visit.  BP/Weight 12/17/2020 08/20/2020 [id number]  Systolic BP [id number]  Diastolic BP 68 70 60  Wt. (Lbs) 198  190.6 188.6  BMI 33.99 32.72 32.37  Some encounter information is confidential and restricted. Go to Review Flowsheets activity to see all data.    Physical Exam  Diabetic Foot Exam - Simple   No data filed      Lab Results  Component Value Date   WBC 8.8 12/17/2020   HGB 16.3 (H) 12/17/2020   HCT 47.8 (H) 12/17/2020   PLT 278 12/17/2020   GLUCOSE 90 12/17/2020   CHOL 198 12/17/2020   TRIG 169 (H) 12/17/2020   HDL 33 (L) 12/17/2020   LDLCALC 134 (H) 12/17/2020   ALT 29 12/17/2020   AST 26 12/17/2020   NA 142 12/17/2020   K 4.8 12/17/2020   CL 107 (H) 12/17/2020   CREATININE 0.86 12/17/2020   BUN 10 12/17/2020   CO2 21 12/17/2020   TSH 2.240 12/17/2020   HGBA1C 5.2 12/17/2020      Assessment & Plan:   Problem List Items Addressed This Visit       Cardiovascular and Mediastinum   Chronic migraine without aura, not intractable - Primary     Other   Autism spectrum disorder   Depression, major, recurrent, mild (HCC)   Panic disorder  .  No orders of the defined types were placed in this encounter.   No orders of the defined types were placed in this encounter.    Follow-up: No follow-ups on file.  An After Visit Summary was printed and given to the patient.  Rochel Brome, MD [facility] [phone]

## 2021-03-25 ENCOUNTER — Ambulatory Visit: Payer: Medicaid Other | Admitting: Family Medicine

## 2021-03-25 DIAGNOSIS — F840 Autistic disorder: Secondary | ICD-10-CM

## 2021-03-25 DIAGNOSIS — F330 Major depressive disorder, recurrent, mild: Secondary | ICD-10-CM

## 2021-03-25 DIAGNOSIS — F410 Panic disorder [episodic paroxysmal anxiety] without agoraphobia: Secondary | ICD-10-CM

## 2021-03-25 DIAGNOSIS — G43709 Chronic migraine without aura, not intractable, without status migrainosus: Secondary | ICD-10-CM

## 2021-03-29 ENCOUNTER — Encounter: Payer: Self-pay | Admitting: Family Medicine

## 2021-03-29 ENCOUNTER — Ambulatory Visit: Payer: Medicaid Other | Admitting: Nurse Practitioner

## 2021-03-29 ENCOUNTER — Other Ambulatory Visit: Payer: Self-pay

## 2021-03-29 ENCOUNTER — Encounter: Payer: Self-pay | Admitting: Nurse Practitioner

## 2021-03-29 VITALS — BP 118/74 | HR 93 | Temp 96.7°F | Ht 64.0 in | Wt 199.0 lb

## 2021-03-29 DIAGNOSIS — F840 Autistic disorder: Secondary | ICD-10-CM | POA: Diagnosis not present

## 2021-03-29 DIAGNOSIS — E782 Mixed hyperlipidemia: Secondary | ICD-10-CM

## 2021-03-29 DIAGNOSIS — F330 Major depressive disorder, recurrent, mild: Secondary | ICD-10-CM | POA: Diagnosis not present

## 2021-03-29 DIAGNOSIS — G43709 Chronic migraine without aura, not intractable, without status migrainosus: Secondary | ICD-10-CM

## 2021-03-29 DIAGNOSIS — E282 Polycystic ovarian syndrome: Secondary | ICD-10-CM

## 2021-03-29 NOTE — Patient Instructions (Signed)
We will call you with lab results Continue medications Follow-up in 93-months  Health Maintenance, Female Adopting a healthy lifestyle and getting preventive care are important in promoting health and wellness. Ask your health care provider about: The right schedule for you to have regular tests and exams. Things you can do on your own to prevent diseases and keep yourself healthy. What should I know about diet, weight, and exercise? Eat a healthy diet  Eat a diet that includes plenty of vegetables, fruits, low-fat dairy products, and lean protein. Do not eat a lot of foods that are high in solid fats, added sugars, or sodium. Maintain a healthy weight Body mass index (BMI) is used to identify weight problems. It estimates body fat based on height and weight. Your health care provider can help determine your BMI and help you achieve or maintain a healthy weight. Get regular exercise Get regular exercise. This is one of the most important things you can do for your health. Most adults should: Exercise for at least 150 minutes each week. The exercise should increase your heart rate and make you sweat (moderate-intensity exercise). Do strengthening exercises at least twice a week. This is in addition to the moderate-intensity exercise. Spend less time sitting. Even light physical activity can be beneficial. Watch cholesterol and blood lipids Have your blood tested for lipids and cholesterol at 27 years of age, then have this test every 5 years. Have your cholesterol levels checked more often if: Your lipid or cholesterol levels are high. You are older than 27 years of age. You are at high risk for heart disease. What should I know about cancer screening? Depending on your health history and family history, you may need to have cancer screening at various ages. This may include screening for: Breast cancer. Cervical cancer. Colorectal cancer. Skin cancer. Lung cancer. What should I know  about heart disease, diabetes, and high blood pressure? Blood pressure and heart disease High blood pressure causes heart disease and increases the risk of stroke. This is more likely to develop in people who have high blood pressure readings or are overweight. Have your blood pressure checked: Every 3-5 years if you are 27-83 years of age. Every year if you are 27 years old or older. Diabetes Have regular diabetes screenings. This checks your fasting blood sugar level. Have the screening done: Once every three years after age 27 if you are at a normal weight and have a low risk for diabetes. More often and at a younger age if you are overweight or have a high risk for diabetes. What should I know about preventing infection? Hepatitis B If you have a higher risk for hepatitis B, you should be screened for this virus. Talk with your health care provider to find out if you are at risk for hepatitis B infection. Hepatitis C Testing is recommended for: Everyone born from [birth year] through 1966. Anyone with known risk factors for hepatitis C. Sexually transmitted infections (STIs) Get screened for STIs, including gonorrhea and chlamydia, if: You are sexually active and are younger than 27 years of age. You are older than 27 years of age and your health care provider tells you that you are at risk for this type of infection. Your sexual activity has changed since you were last screened, and you are at increased risk for chlamydia or gonorrhea. Ask your health care provider if you are at risk. Ask your health care provider about whether you are at high risk for HIV.  Your health care provider may recommend a prescription medicine to help prevent HIV infection. If you choose to take medicine to prevent HIV, you should first get tested for HIV. You should then be tested every 3 months for as long as you are taking the medicine. Pregnancy If you are about to stop having your period (premenopausal) and you  may become pregnant, seek counseling before you get pregnant. Take 400 to 800 micrograms (mcg) of folic acid every day if you become pregnant. Ask for birth control (contraception) if you want to prevent pregnancy. Osteoporosis and menopause Osteoporosis is a disease in which the bones lose minerals and strength with aging. This can result in bone fractures. If you are 77 years old or older, or if you are at risk for osteoporosis and fractures, ask your health care provider if you should: Be screened for bone loss. Take a calcium or vitamin D supplement to lower your risk of fractures. Be given hormone replacement therapy (HRT) to treat symptoms of menopause. Follow these instructions at home: Alcohol use Do not drink alcohol if: Your health care provider tells you not to drink. You are pregnant, may be pregnant, or are planning to become pregnant. If you drink alcohol: Limit how much you have to: 0-1 drink a day. Know how much alcohol is in your drink. In the [place] one drink equals one 12 oz bottle of beer (355 mL), one 5 oz glass of wine (148 mL), or one 1 oz glass of hard liquor (44 mL). Lifestyle Do not use any products that contain nicotine or tobacco. These products include cigarettes, chewing tobacco, and vaping devices, such as e-cigarettes. If you need help quitting, ask your health care provider. Do not use street drugs. Do not share needles. Ask your health care provider for help if you need support or information about quitting drugs. General instructions Schedule regular health, dental, and eye exams. Stay current with your vaccines. Tell your health care provider if: You often feel depressed. You have ever been abused or do not feel safe at home. Summary Adopting a healthy lifestyle and getting preventive care are important in promoting health and wellness. Follow your health care provider's instructions about healthy diet, exercising, and getting tested or screened for  diseases. Follow your health care provider's instructions on monitoring your cholesterol and blood pressure. This information is not intended to replace advice given to you by your health care provider. Make sure you discuss any questions you have with your health care provider. Document Revised: 07/09/2020 Document Reviewed: 07/09/2020 Elsevier Patient Education  2022 [employer].

## 2021-03-29 NOTE — Progress Notes (Signed)
Subjective:  Patient ID: Betty Rogers, female    DOB: 05-20-1995  Age: 26 y.o. MRN: [id number]  Chief Complaint  Patient presents with   Depression   Gastroesophageal Reflux    HPI  Betty Rogers is a 26 year old Caucasian female that presents for follow-up of depression and GERD. She denies any acute problems today. States she had a recent eye exam last year. She has obtained COVID-19 vaccines and booster; up-to-date on seasonal flu immunization. Normal pap smear 12/05/20.   Betty Rogers has history of autism, PCOS, and migraines. She has special assistance for autism. PCOS is currently treated with Metformin to improve insulin resistance. Migraines are well-controlled with Emgality monthly injection, Propranolol 80 mg daily, and Maxalt AS NEEDED for breakthrough migraines.  GERD, Follow up:  The patient was last seen for GERD 3 months ago. Current treatment includes Omeprazole. She reports excellent compliance with treatment. She is not having side effects. . She is not experiencing symptoms.    Depression, Follow-up  She  was last seen for this 3 months ago. Current treatment includes Buspar, Prozac, Abilify and Hydroxyzine.   She reports excellent compliance with treatment. She is not having side effects.   She reports excellent tolerance of treatment. Current symptoms include: fatigue She feels she is Improved since last visit.  Depression screen [facility] 2/9 03/29/2021 12/17/2020  Decreased Interest 0 0  Down, Depressed, Hopeless 0 0  PHQ - 2 Score 0 0      Current Outpatient Medications on File Prior to Visit  Medication Sig Dispense Refill   ARIPiprazole (ABILIFY) 5 MG tablet Take 5 mg by mouth at bedtime.     busPIRone (BUSPAR) 10 MG tablet Take 10 mg by mouth 2 (two) times daily.     butalbital-acetaminophen-caffeine (FIORICET, ESGIC) 50-325-40 MG tablet Take 1-2 tablets by mouth every 4 (four) hours as needed for headache. No more than 6 per day     dicyclomine (BENTYL) 20  MG tablet Take 10 mg by mouth 4 (four) times daily -  before meals and at bedtime.      Diethylpropion HCl 25 MG TABS Take 25 mg by mouth daily.     divalproex (DEPAKOTE ER) 250 MG 24 hr tablet Take 250 mg by mouth at bedtime.     FLUoxetine (PROZAC) 20 MG capsule Take 1 capsule (20 mg total) by mouth daily. 30 capsule 0   Galcanezumab-gnlm (EMGALITY) 120 MG/ML SOAJ INJECT 120MG  INTO THE SKIN EVERY 30 DAYS 3 mL 3   hydrOXYzine (VISTARIL) 25 MG capsule Take 25 mg by mouth at bedtime.     LORazepam (ATIVAN) 1 MG tablet Take 1 mg by mouth 2 (two) times daily.     medroxyPROGESTERone Acetate (DEPO-PROVERA IM) Inject into the muscle every 3 (three) months.     metFORMIN (GLUCOPHAGE) 500 MG tablet Take 1 tablet (500 mg total) by mouth daily with breakfast. 90 tablet 0   omeprazole (PRILOSEC) 20 MG capsule Take 20 mg by mouth 2 (two) times daily.     prochlorperazine (COMPAZINE) 10 MG tablet Take 10 mg by mouth 3 (three) times daily.     propranolol ER (INDERAL LA) 80 MG 24 hr capsule Take 1 capsule (80 mg total) by mouth at bedtime. 90 capsule 3   rizatriptan (MAXALT-MLT) 10 MG disintegrating tablet Take 1 tablet (10 mg total) by mouth as needed for migraine. May repeat in 2 hours if needed 9 tablet 11   VITAMIN D PO Take 125 mcg by mouth daily.  No current facility-administered medications on file prior to visit.   Past Medical History:  Diagnosis Date   Agoraphobia 11/07/2016   Anxiety and depression    History of anxiety 03/29/2015   History of attention deficit hyperactivity disorder (ADHD) 03/29/2015   History of depression 03/29/2015   Migraines    Panic attacks    Psychosis ([place]) 07/18/2017   Past Surgical History:  Procedure Laterality Date   CHOLECYSTECTOMY     CHOLECYSTECTOMY     ESOPHAGOGASTRODUODENOSCOPY     REMOVAL BIRTH MARK ON HEAD   2008    Family History  Problem Relation Age of Onset   Heart attack Other    Cancer Other    Social History   Socioeconomic History    Marital status: Single    Spouse name: Not on file   Number of children: Not on file   Years of education: Not on file   Highest education level: High school graduate  Occupational History   Not on file  Tobacco Use   Smoking status: Never   Smokeless tobacco: Never  Vaping Use   Vaping Use: Never used  Substance and Sexual Activity   Alcohol use: Yes    Comment: "a half a wine cooler occasionally"   Drug use: Never   Sexual activity: Not Currently    Birth control/protection: Abstinence, Injection  Other Topics Concern   Not on file  Social History Narrative   Lives at home with her parents   Right handed   No caffeine   Social Determinants of Radio broadcast assistant Strain: Not on file  Food Insecurity: Not on file  Transportation Needs: Not on file  Physical Activity: Not on file  Stress: Not on file  Social Connections: Not on file    Review of Systems  Constitutional:  Negative for chills, fatigue and fever.  HENT:  Negative for congestion, ear pain, rhinorrhea and sore throat.   Respiratory:  Negative for cough and shortness of breath.   Cardiovascular:  Negative for chest pain.  Gastrointestinal:  Negative for abdominal pain, constipation, diarrhea, nausea and vomiting.  Genitourinary:  Negative for dysuria and urgency.  Musculoskeletal:  Negative for back pain and myalgias.  Neurological:  Negative for dizziness, weakness, light-headedness and headaches.  Psychiatric/Behavioral:  Negative for dysphoric mood. The patient is not nervous/anxious.     Objective:  Pulse 93    Temp (!) 96.7 F (35.9 C)    Ht 5\' 4"  (1.626 m)    Wt 199 lb (90.3 kg)    SpO2 98%    BMI 34.16 kg/m   BP/Weight 03/29/2021 12/17/2020 [id number]  Systolic BP - 123XX123 [id number]  Diastolic BP - 68 70  Wt. (Lbs) 199 198 190.6  BMI 34.16 33.99 32.72  Some encounter information is confidential and restricted. Go to Review Flowsheets activity to see all data.    Physical Exam Vitals reviewed.   Constitutional:      Appearance: She is obese.  HENT:     Head: Normocephalic.     Right Ear: Tympanic membrane normal.     Left Ear: Tympanic membrane normal.     Nose: Nose normal.     Mouth/Throat:     Mouth: Mucous membranes are moist.  Eyes:     Pupils: Pupils are equal, round, and reactive to light.     Comments: Eyeglasses in place  Cardiovascular:     Rate and Rhythm: Normal rate and regular rhythm.     Pulses:  Normal pulses.     Heart sounds: Normal heart sounds.  Pulmonary:     Effort: Pulmonary effort is normal.     Breath sounds: Normal breath sounds.  Abdominal:     General: Bowel sounds are normal.     Palpations: Abdomen is soft.  Musculoskeletal:        General: Normal range of motion.     Cervical back: Neck supple.  Skin:    General: Skin is warm and dry.     Capillary Refill: Capillary refill takes less than 2 seconds.  Neurological:     General: No focal deficit present.     Mental Status: She is alert and oriented to person, place, and time.  Psychiatric:        Mood and Affect: Mood normal.        Behavior: Behavior normal.       Lab Results  Component Value Date   WBC 8.8 12/17/2020   HGB 16.3 (H) 12/17/2020   HCT 47.8 (H) 12/17/2020   PLT 278 12/17/2020   GLUCOSE 90 12/17/2020   CHOL 198 12/17/2020   TRIG 169 (H) 12/17/2020   HDL 33 (L) 12/17/2020   LDLCALC 134 (H) 12/17/2020   ALT 29 12/17/2020   AST 26 12/17/2020   NA 142 12/17/2020   K 4.8 12/17/2020   CL 107 (H) 12/17/2020   CREATININE 0.86 12/17/2020   BUN 10 12/17/2020   CO2 21 12/17/2020   TSH 2.240 12/17/2020   HGBA1C 5.2 12/17/2020      Assessment & Plan:      1. Depression, major, recurrent, mild (HCC)-well controlled - CBC with Differential/Platelet - Comprehensive metabolic panel - Hemoglobin A1c - Lipid panel -continue Abilify, Buspar, and Prozac as prescribed -follow-up with psychiatry as scheduled  2. Mixed hyperlipidemia-not at goal - CBC with  Differential/Platelet - Comprehensive metabolic panel - Hemoglobin A1c - Lipid panel -heart healthy diet -increase physical activity  3. Autism spectrum disorder -patient receives special assistance  4. PCOS (polycystic ovarian syndrome) -HgbA1C -Lipid -CBC -CMP -continue Metformin as prescribed   5. Chronic migraine without aura without status migrainosus, not intractable-well controlled -avoid known migraine triggers -continue Emgality, Propranolol, and Maxalt as prescribed   We will call you with lab results Continue medications Follow-up in 38-months     Follow-up: 70-months, fasting  An After Visit Summary was printed and given to the patient.  I, Rip Harbour, NP, have reviewed all documentation for this visit. The documentation on 03/29/21 for the exam, diagnosis, procedures, and orders are all accurate and complete.    Signed, Rip Harbour, NP [place] [phone]

## 2021-03-30 LAB — CBC WITH DIFFERENTIAL/PLATELET
Basophils Absolute: 0 10*3/uL (ref 0.0–0.2)
Basos: 0 %
EOS (ABSOLUTE): 0 10*3/uL (ref 0.0–0.4)
Eos: 1 %
Hematocrit: 44.3 % (ref 34.0–46.6)
Hemoglobin: 15.4 g/dL (ref 11.1–15.9)
Immature Grans (Abs): 0 10*3/uL (ref 0.0–0.1)
Immature Granulocytes: 0 %
Lymphocytes Absolute: 2.5 10*3/uL (ref 0.7–3.1)
Lymphs: 41 %
MCH: 29.2 pg (ref 26.6–33.0)
MCHC: 34.8 g/dL (ref 31.5–35.7)
MCV: 84 fL (ref 79–97)
Monocytes Absolute: 0.6 10*3/uL (ref 0.1–0.9)
Monocytes: 10 %
Neutrophils Absolute: 2.9 10*3/uL (ref 1.4–7.0)
Neutrophils: 48 %
Platelets: 257 10*3/uL (ref 150–450)
RBC: 5.27 x10E6/uL (ref 3.77–5.28)
RDW: 12.1 % (ref 11.7–15.4)
WBC: 6 10*3/uL (ref 3.4–10.8)

## 2021-03-30 LAB — COMPREHENSIVE METABOLIC PANEL
ALT: 72 IU/L — ABNORMAL HIGH (ref 0–32)
AST: 107 IU/L — ABNORMAL HIGH (ref 0–40)
Albumin/Globulin Ratio: 2.4 — ABNORMAL HIGH (ref 1.2–2.2)
Albumin: 4.7 g/dL (ref 3.9–5.0)
Alkaline Phosphatase: 85 IU/L (ref 44–121)
BUN/Creatinine Ratio: 10 (ref 9–23)
BUN: 10 mg/dL (ref 6–20)
Bilirubin Total: 1.3 mg/dL — ABNORMAL HIGH (ref 0.0–1.2)
CO2: 20 mmol/L (ref 20–29)
Calcium: 9.7 mg/dL (ref 8.7–10.2)
Chloride: 109 mmol/L — ABNORMAL HIGH (ref 96–106)
Creatinine, Ser: 0.97 mg/dL (ref 0.57–1.00)
Globulin, Total: 2 g/dL (ref 1.5–4.5)
Glucose: 93 mg/dL (ref 70–99)
Potassium: 4.6 mmol/L (ref 3.5–5.2)
Sodium: 142 mmol/L (ref 134–144)
Total Protein: 6.7 g/dL (ref 6.0–8.5)
eGFR: 83 mL/min/{1.73_m2} (ref >59)

## 2021-03-30 LAB — HEMOGLOBIN A1C
Est. average glucose Bld gHb Est-mCnc: 103 mg/dL
Hgb A1c MFr Bld: 5.2 % (ref 4.8–5.6)

## 2021-03-30 LAB — LIPID PANEL
Chol/HDL Ratio: 5 ratio — ABNORMAL HIGH (ref 0.0–4.4)
Cholesterol, Total: 169 mg/dL (ref 100–199)
HDL: 34 mg/dL — ABNORMAL LOW (ref >39)
LDL Chol Calc (NIH): 110 mg/dL — ABNORMAL HIGH (ref 0–99)
Triglycerides: 136 mg/dL (ref 0–149)
VLDL Cholesterol Cal: 25 mg/dL (ref 5–40)

## 2021-03-30 LAB — CARDIOVASCULAR RISK ASSESSMENT

## 2021-04-02 ENCOUNTER — Telehealth: Payer: Self-pay

## 2021-04-02 NOTE — Telephone Encounter (Signed)
I have submitted a PA request for Emgality 120mg /mL on [employer], Confirmation #:[id number].  Awaiting determination.

## 2021-04-08 NOTE — Telephone Encounter (Signed)
PA approved 04/02/21-04/02/22. Prior Approval #[id number].

## 2021-04-09 ENCOUNTER — Other Ambulatory Visit: Payer: Self-pay

## 2021-04-09 MED ORDER — METFORMIN HCL 500 MG PO TABS: 500.0000 mg | ORAL_TABLET | Freq: Every day | ORAL | 0 refills | Status: DC

## 2021-04-16 ENCOUNTER — Other Ambulatory Visit: Payer: Medicaid Other

## 2021-04-16 ENCOUNTER — Other Ambulatory Visit: Payer: Self-pay

## 2021-04-16 DIAGNOSIS — E782 Mixed hyperlipidemia: Secondary | ICD-10-CM

## 2021-04-17 LAB — COMPREHENSIVE METABOLIC PANEL
ALT: 23 IU/L (ref 0–32)
AST: 19 IU/L (ref 0–40)
Albumin/Globulin Ratio: 2.5 — ABNORMAL HIGH (ref 1.2–2.2)
Albumin: 4.9 g/dL (ref 3.9–5.0)
Alkaline Phosphatase: 85 IU/L (ref 44–121)
BUN/Creatinine Ratio: 11 (ref 9–23)
BUN: 10 mg/dL (ref 6–20)
Bilirubin Total: 1.3 mg/dL — ABNORMAL HIGH (ref 0.0–1.2)
CO2: 20 mmol/L (ref 20–29)
Calcium: 9.6 mg/dL (ref 8.7–10.2)
Chloride: 112 mmol/L — ABNORMAL HIGH (ref 96–106)
Creatinine, Ser: 0.91 mg/dL (ref 0.57–1.00)
Globulin, Total: 2 g/dL (ref 1.5–4.5)
Glucose: 84 mg/dL (ref 70–99)
Potassium: 4.7 mmol/L (ref 3.5–5.2)
Sodium: 147 mmol/L — ABNORMAL HIGH (ref 134–144)
Total Protein: 6.9 g/dL (ref 6.0–8.5)
eGFR: 89 mL/min/{1.73_m2} (ref >59)

## 2021-04-17 LAB — CBC WITH DIFFERENTIAL/PLATELET
Basophils Absolute: 0 10*3/uL (ref 0.0–0.2)
Basos: 1 %
EOS (ABSOLUTE): 0.1 10*3/uL (ref 0.0–0.4)
Eos: 1 %
Hematocrit: 47.2 % — ABNORMAL HIGH (ref 34.0–46.6)
Hemoglobin: 16.3 g/dL — ABNORMAL HIGH (ref 11.1–15.9)
Immature Grans (Abs): 0 10*3/uL (ref 0.0–0.1)
Immature Granulocytes: 0 %
Lymphocytes Absolute: 3.3 10*3/uL — ABNORMAL HIGH (ref 0.7–3.1)
Lymphs: 41 %
MCH: 29.2 pg (ref 26.6–33.0)
MCHC: 34.5 g/dL (ref 31.5–35.7)
MCV: 84 fL (ref 79–97)
Monocytes Absolute: 0.7 10*3/uL (ref 0.1–0.9)
Monocytes: 8 %
Neutrophils Absolute: 4 10*3/uL (ref 1.4–7.0)
Neutrophils: 49 %
Platelets: 285 10*3/uL (ref 150–450)
RBC: 5.59 x10E6/uL — ABNORMAL HIGH (ref 3.77–5.28)
RDW: 12 % (ref 11.7–15.4)
WBC: 8.1 10*3/uL (ref 3.4–10.8)

## 2021-04-17 LAB — LIPID PANEL
Chol/HDL Ratio: 5.3 ratio — ABNORMAL HIGH (ref 0.0–4.4)
Cholesterol, Total: 171 mg/dL (ref 100–199)
HDL: 32 mg/dL — ABNORMAL LOW (ref >39)
LDL Chol Calc (NIH): 108 mg/dL — ABNORMAL HIGH (ref 0–99)
Triglycerides: 177 mg/dL — ABNORMAL HIGH (ref 0–149)
VLDL Cholesterol Cal: 31 mg/dL (ref 5–40)

## 2021-04-17 LAB — CARDIOVASCULAR RISK ASSESSMENT

## 2021-05-01 ENCOUNTER — Encounter: Payer: Self-pay | Admitting: Family Medicine

## 2021-05-07 ENCOUNTER — Encounter: Payer: Self-pay | Admitting: Family Medicine

## 2021-07-02 ENCOUNTER — Encounter: Payer: Self-pay | Admitting: Family Medicine

## 2021-07-02 ENCOUNTER — Ambulatory Visit: Payer: Medicaid Other | Admitting: Family Medicine

## 2021-07-02 VITALS — BP 92/60 | HR 74 | Temp 96.2°F | Resp 16 | Ht 64.0 in | Wt 195.6 lb

## 2021-07-02 DIAGNOSIS — Z6833 Body mass index (BMI) 33.0-33.9, adult: Secondary | ICD-10-CM

## 2021-07-02 DIAGNOSIS — E282 Polycystic ovarian syndrome: Secondary | ICD-10-CM | POA: Diagnosis not present

## 2021-07-02 DIAGNOSIS — G43709 Chronic migraine without aura, not intractable, without status migrainosus: Secondary | ICD-10-CM

## 2021-07-02 DIAGNOSIS — F840 Autistic disorder: Secondary | ICD-10-CM

## 2021-07-02 DIAGNOSIS — E6609 Other obesity due to excess calories: Secondary | ICD-10-CM

## 2021-07-02 DIAGNOSIS — E782 Mixed hyperlipidemia: Secondary | ICD-10-CM | POA: Diagnosis not present

## 2021-07-02 DIAGNOSIS — F330 Major depressive disorder, recurrent, mild: Secondary | ICD-10-CM | POA: Diagnosis not present

## 2021-07-02 NOTE — Progress Notes (Signed)
? ?Subjective:  ?Patient ID: Betty Rogers, female    DOB: 04/28/1995  Age: 26 y.o. MRN: [id number] ? ?Chief Complaint  ?Patient presents with  ? Depression  ? ? ?HPI ?Patient is a 26 year old white female with past medical history of PCOS, depression, autism spectrum disorder, and migraines, who presents for chronic follow-up.  [facility]'s only [facility] by [facility] end of the third week.  She wants to take her Emgality early and her neurologist said that that is okay, however she only gets 1/month from her Medicaid.  Patient uses Maxalt for breakthrough migraines which does help.  She used to take Fioricet but she has not had that in a couple of years.  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  Patient is on Bentyl 20 mg 1 p.o. before every meal and nightly. ? ?In addition, she sees psychiatry and her depression is well controlled.  She is currently on Abilify 5 mg nightly, BuSpar 10 mg twice daily, fluoxetine 20 mg daily, Depakote 250 mg nightly, hydroxyzine 25 mg nightly and lorazepam 1 mg twice daily.  ?PCOS: On metformin 500 mg daily. ? ?Somehow she had two sets of lab panels done.  One on January 27 and a second on February 14.  This second panel was ordered by her psychiatrist orders although she did come here.  Regardless she is too early to get her cholesterol checked. ?Patient has been eating healthy.  She has lost 5 pounds.  She is walking more. ? ?Current Outpatient Medications on File Prior to Visit  ?Medication Sig Dispense Refill  ? ARIPiprazole (ABILIFY) 5 MG tablet Take 5 mg by mouth at bedtime.    ? busPIRone (BUSPAR) 10 MG tablet Take 10 mg by mouth 2 (two) times daily.    ? dicyclomine (BENTYL) 20 MG tablet Take 10 mg by mouth 4 (four) times daily -  before meals and at bedtime.     ? divalproex (DEPAKOTE ER) 250 MG 24 hr tablet Take 250 mg by mouth at bedtime.    ? FLUoxetine (PROZAC) 20 MG capsule Take 1 capsule (20 mg total) by mouth  daily. 30 capsule 0  ? Galcanezumab-gnlm (EMGALITY) 120 MG/ML SOAJ INJECT 120MG  INTO THE SKIN EVERY 30 DAYS 3 mL 3  ? hydrOXYzine (VISTARIL) 25 MG capsule Take 25 mg by mouth at bedtime.    ? LORazepam (ATIVAN) 1 MG tablet Take 1 mg by mouth 2 (two) times daily.    ? medroxyPROGESTERone Acetate (DEPO-PROVERA IM) Inject into the muscle every 3 (three) months.    ? metFORMIN (GLUCOPHAGE) 500 MG tablet Take 1 tablet (500 mg total) by mouth daily with breakfast. 90 tablet 0  ? omeprazole (PRILOSEC) 20 MG capsule Take 20 mg by mouth 2 (two) times daily.    ? prochlorperazine (COMPAZINE) 10 MG tablet Take 10 mg by mouth 3 (three) times daily.    ? propranolol ER (INDERAL LA) 80 MG 24 hr capsule Take 1 capsule (80 mg total) by mouth at bedtime. 90 capsule 3  ? rizatriptan (MAXALT-MLT) 10 MG disintegrating tablet Take 1 tablet (10 mg total) by mouth as needed for migraine. May repeat in 2 hours if needed 9 tablet 11  ? VITAMIN D PO Take 125 mcg by mouth daily.    ? ?No current facility-administered medications on file prior to visit.  ? ?Past Medical History:  ?Diagnosis Date  ? Agoraphobia 11/07/2016  ? Anxiety and depression   ?  History of anxiety 03/29/2015  ? History of attention deficit hyperactivity disorder (ADHD) 03/29/2015  ? History of depression 03/29/2015  ? Migraines   ? Panic attacks   ? Psychosis (HCC) 07/18/2017  ? ?Past Surgical History:  ?Procedure Laterality Date  ? CHOLECYSTECTOMY    ? CHOLECYSTECTOMY    ? ESOPHAGOGASTRODUODENOSCOPY    ? REMOVAL BIRTH MARK ON HEAD   2008  ?  ?Family History  ?Problem Relation Age of Onset  ? Heart attack Other   ? Cancer Other   ? Cancer Maternal Aunt   ?     Pancreatic Stg II  ? ?Social History  ? ?Socioeconomic History  ? Marital status: Single  ?  Spouse name: Not on file  ? Number of children: Not on file  ? Years of education: Not on file  ? Highest education level: High school graduate  ?Occupational History  ? Not on file  ?Tobacco Use  ? Smoking status: Never  ?  Smokeless tobacco: Never  ?Vaping Use  ? Vaping Use: Never used  ?Substance and Sexual Activity  ? Alcohol use: Yes  ?  Comment: "a half a wine cooler occasionally"  ? Drug use: Never  ? Sexual activity: Not Currently  ?  Birth control/protection: Abstinence, Injection  ?Other Topics Concern  ? Not on file  ?Social History Narrative  ? Lives at home with her parents  ? Right handed  ? No caffeine  ? ?Social Determinants of Health  ? ?Financial Resource Strain: Not on file  ?Food Insecurity: Not on file  ?Transportation Needs: Not on file  ?Physical Activity: Not on file  ?Stress: Not on file  ?Social Connections: Not on file  ? ? ?Review of Systems  ?Constitutional:  Positive for fatigue. Negative for chills and fever.  ?HENT:  Negative for congestion, ear pain, rhinorrhea and sore throat.   ?Respiratory:  Negative for cough and shortness of breath.   ?Cardiovascular:  Negative for chest pain.  ?Gastrointestinal:  Negative for abdominal pain, constipation, diarrhea, nausea and vomiting.  ?Genitourinary:  Negative for dysuria and urgency.  ?Musculoskeletal:  Negative for back pain and myalgias.  ?Neurological:  Positive for headaches. Negative for dizziness, weakness and light-headedness.  ?Psychiatric/Behavioral:  Positive for dysphoric mood. Negative for self-injury, sleep disturbance and suicidal ideas. The patient is not nervous/anxious.   ? ? ?Objective:  ?BP 92/60   Pulse 74   Temp (!) 96.2 ?F (35.7 ?C)   Resp 16   Ht 5\' 4"  (1.626 m)   Wt 195 lb 9.6 oz (88.7 kg)   BMI 33.57 kg/m?  ? ? ?  07/02/2021  ?  7:27 AM 03/29/2021  ?  8:40 AM 12/17/2020  ?  7:44 AM  ?BP/Weight  ?Systolic BP 92 118 122  ?Diastolic BP 60 74 68  ?Wt. (Lbs) 195.6 199 198  ?BMI 33.57 kg/m2 34.16 kg/m2 33.99 kg/m2  ? ? ?Physical Exam ?Vitals reviewed.  ?Constitutional:   ?   Appearance: Normal appearance. She is obese.  ?Neck:  ?   Vascular: No carotid bruit.  ?Cardiovascular:  ?   Rate and Rhythm: Normal rate and regular rhythm.  ?   Heart  sounds: Normal heart sounds.  ?Pulmonary:  ?   Effort: Pulmonary effort is normal. No respiratory distress.  ?   Breath sounds: Normal breath sounds.  ?Abdominal:  ?   General: Abdomen is flat. Bowel sounds are normal.  ?   Palpations: Abdomen is soft.  ?   Tenderness:  There is no abdominal tenderness.  ?Neurological:  ?   Mental Status: She is alert and oriented to person, place, and time.  ?Psychiatric:     ?   Mood and Affect: Mood normal.     ?   Behavior: Behavior normal.  ? ? ?Diabetic Foot Exam - Simple   ?No data filed ?  ?  ? ?Lab Results  ?Component Value Date  ? WBC 8.1 04/16/2021  ? HGB 16.3 (H) 04/16/2021  ? HCT 47.2 (H) 04/16/2021  ? PLT 285 04/16/2021  ? GLUCOSE 84 04/16/2021  ? CHOL 171 04/16/2021  ? TRIG 177 (H) 04/16/2021  ? HDL 32 (L) 04/16/2021  ? LDLCALC 108 (H) 04/16/2021  ? ALT 23 04/16/2021  ? AST 19 04/16/2021  ? NA 147 (H) 04/16/2021  ? K 4.7 04/16/2021  ? CL 112 (H) 04/16/2021  ? CREATININE 0.91 04/16/2021  ? BUN 10 04/16/2021  ? CO2 20 04/16/2021  ? TSH 2.240 12/17/2020  ? HGBA1C 5.2 03/29/2021  ? ? ? ? ?Assessment & Plan:  ? ?Problem List Items Addressed This Visit   ? ?  ? Cardiovascular and Mediastinum  ? Chronic migraine without aura, not intractable  ?  The current medical regimen is effective;  continue present plan and medications. ?Continue emgallity shot monthly. Given sample so could take it every 3-4 weeks.  ?On maxalt.  ? ?  ?  ?  ? Endocrine  ? PCOS (polycystic ovarian syndrome)  ?  Continue depo shot.  ?Continue metformin. ? ?  ?  ?  ? Other  ? Autism spectrum disorder  ?  Management per specialist.  ?Psychiatry.  ? ?  ?  ? Depression, major, recurrent, mild (HCC)  ?  Management per specialist. Psychiatry.  ?Well controlled.  ?The current medical regimen is effective;  continue present plan and medications. ?Continue Abilify 5 mg nightly, BuSpar 10 mg twice daily, fluoxetine 20 mg daily, Depakote 250 mg nightly, hydroxyzine 25 mg nightly and lorazepam 1 mg twice daily.   ? ? ? ?  ?  ? Class 1 obesity due to excess calories without serious comorbidity with body mass index (BMI) of 33.0 to 33.9 in adult  ?  Recommend continue to work on eating healthy diet and exercise. ? ? ?  ?  ? [place]

## 2021-07-14 DIAGNOSIS — E782 Mixed hyperlipidemia: Secondary | ICD-10-CM | POA: Insufficient documentation

## 2021-07-14 NOTE — Assessment & Plan Note (Signed)
>>  ASSESSMENT AND PLAN FOR CHRONIC MIGRAINE WITHOUT AURA, NOT INTRACTABLE WRITTEN ON 07/14/2021 10:32 PM BY COX, KIRSTEN, MD  The current medical regimen is effective;  continue present plan and medications. Continue emgallity shot monthly. Given sample so could take it every 3-4 weeks.  On maxalt .

## 2021-07-14 NOTE — Assessment & Plan Note (Signed)
Recommend continue to work on eating healthy diet and exercise.  

## 2021-07-14 NOTE — Assessment & Plan Note (Signed)
Continue depo shot.  ?Continue metformin. ?

## 2021-07-14 NOTE — Assessment & Plan Note (Signed)
Management per specialist. Psychiatry.  

## 2021-07-14 NOTE — Assessment & Plan Note (Addendum)
Management per specialist. Psychiatry.  ?Well controlled.  ?The current medical regimen is effective;  continue present plan and medications. ?Continue Abilify 5 mg nightly, BuSpar 10 mg twice daily, fluoxetine 20 mg daily, Depakote 250 mg nightly, hydroxyzine 25 mg nightly and lorazepam 1 mg twice daily.  ? ? ?

## 2021-07-14 NOTE — Assessment & Plan Note (Signed)
The current medical regimen is effective;  continue present plan and medications. ?Continue emgallity shot monthly. Given sample so could take it every 3-4 weeks.  ?On maxalt.  ?

## 2021-07-14 NOTE — Assessment & Plan Note (Signed)
Recommend continue to work on eating healthy diet and exercise. ?Continue to work on weight loss.  ?To early to check lipid panel again. ?

## 2021-07-31 ENCOUNTER — Encounter: Payer: Self-pay | Admitting: Family Medicine

## 2021-07-31 ENCOUNTER — Other Ambulatory Visit: Payer: Medicaid Other

## 2021-07-31 DIAGNOSIS — E782 Mixed hyperlipidemia: Secondary | ICD-10-CM

## 2021-08-01 LAB — CBC WITH DIFFERENTIAL/PLATELET
Basophils Absolute: 0 x10E3/uL (ref 0.0–0.2)
Basos: 1 %
EOS (ABSOLUTE): 0.1 x10E3/uL (ref 0.0–0.4)
Eos: 1 %
Hematocrit: 45.6 % (ref 34.0–46.6)
Hemoglobin: 15.7 g/dL (ref 11.1–15.9)
Immature Grans (Abs): 0 x10E3/uL (ref 0.0–0.1)
Immature Granulocytes: 0 %
Lymphocytes Absolute: 3.3 x10E3/uL — ABNORMAL HIGH (ref 0.7–3.1)
Lymphs: 46 %
MCH: 29.4 pg (ref 26.6–33.0)
MCHC: 34.4 g/dL (ref 31.5–35.7)
MCV: 85 fL (ref 79–97)
Monocytes Absolute: 0.6 x10E3/uL (ref 0.1–0.9)
Monocytes: 8 %
Neutrophils Absolute: 3.1 x10E3/uL (ref 1.4–7.0)
Neutrophils: 44 %
Platelets: 262 x10E3/uL (ref 150–450)
RBC: 5.34 x10E6/uL — ABNORMAL HIGH (ref 3.77–5.28)
RDW: 12.8 % (ref 11.7–15.4)
WBC: 7.2 x10E3/uL (ref 3.4–10.8)

## 2021-08-01 LAB — COMPREHENSIVE METABOLIC PANEL WITH GFR
ALT: 33 IU/L — ABNORMAL HIGH (ref 0–32)
AST: 29 IU/L (ref 0–40)
Albumin/Globulin Ratio: 2.2 (ref 1.2–2.2)
Albumin: 4.8 g/dL (ref 3.9–5.0)
Alkaline Phosphatase: 75 IU/L (ref 44–121)
BUN/Creatinine Ratio: 11 (ref 9–23)
BUN: 11 mg/dL (ref 6–20)
Bilirubin Total: 1.4 mg/dL — ABNORMAL HIGH (ref 0.0–1.2)
CO2: 20 mmol/L (ref 20–29)
Calcium: 9.7 mg/dL (ref 8.7–10.2)
Chloride: 111 mmol/L — ABNORMAL HIGH (ref 96–106)
Creatinine, Ser: 0.99 mg/dL (ref 0.57–1.00)
Globulin, Total: 2.2 g/dL (ref 1.5–4.5)
Glucose: 88 mg/dL (ref 70–99)
Potassium: 4.4 mmol/L (ref 3.5–5.2)
Sodium: 145 mmol/L — ABNORMAL HIGH (ref 134–144)
Total Protein: 7 g/dL (ref 6.0–8.5)
eGFR: 80 mL/min/1.73

## 2021-08-01 LAB — LIPID PANEL
Chol/HDL Ratio: 5.3 ratio — ABNORMAL HIGH (ref 0.0–4.4)
Cholesterol, Total: 169 mg/dL (ref 100–199)
HDL: 32 mg/dL — ABNORMAL LOW
LDL Chol Calc (NIH): 107 mg/dL — ABNORMAL HIGH (ref 0–99)
Triglycerides: 170 mg/dL — ABNORMAL HIGH (ref 0–149)
VLDL Cholesterol Cal: 30 mg/dL (ref 5–40)

## 2021-08-01 LAB — CARDIOVASCULAR RISK ASSESSMENT

## 2021-08-26 ENCOUNTER — Telehealth: Payer: Self-pay | Admitting: Neurology

## 2021-08-26 ENCOUNTER — Encounter: Payer: Self-pay | Admitting: Neurology

## 2021-08-26 ENCOUNTER — Ambulatory Visit: Payer: Medicaid Other | Admitting: Neurology

## 2021-08-26 VITALS — BP 110/72 | HR 77 | Ht 64.0 in | Wt 190.0 lb

## 2021-08-26 DIAGNOSIS — G43009 Migraine without aura, not intractable, without status migrainosus: Secondary | ICD-10-CM | POA: Diagnosis not present

## 2021-08-26 DIAGNOSIS — M542 Cervicalgia: Secondary | ICD-10-CM | POA: Diagnosis not present

## 2021-08-26 DIAGNOSIS — R251 Tremor, unspecified: Secondary | ICD-10-CM | POA: Diagnosis not present

## 2021-08-26 DIAGNOSIS — G43011 Migraine without aura, intractable, with status migrainosus: Secondary | ICD-10-CM | POA: Insufficient documentation

## 2021-08-26 DIAGNOSIS — G43711 Chronic migraine without aura, intractable, with status migrainosus: Secondary | ICD-10-CM

## 2021-08-26 DIAGNOSIS — M5481 Occipital neuralgia: Secondary | ICD-10-CM

## 2021-08-26 MED ORDER — PROPRANOLOL HCL ER 80 MG PO CP24: 80.0000 mg | ORAL_CAPSULE | Freq: Every day | ORAL | 3 refills | Status: DC

## 2021-08-26 MED ORDER — CYCLOBENZAPRINE HCL 5 MG PO TABS: 5.0000 mg | ORAL_TABLET | Freq: Every day | ORAL | 1 refills | Status: DC

## 2021-08-26 MED ORDER — ONDANSETRON 4 MG PO TBDP: 4.0000 mg | ORAL_TABLET | Freq: Three times a day (TID) | ORAL | 6 refills | Status: DC | PRN

## 2021-08-26 MED ORDER — EMGALITY 120 MG/ML ~~LOC~~ SOAJ: SUBCUTANEOUS | 3 refills | Status: DC

## 2021-08-26 MED ORDER — NURTEC 75 MG PO TBDP: 75.0000 mg | ORAL_TABLET | Freq: Every day | ORAL | 11 refills | Status: DC | PRN

## 2021-09-04 ENCOUNTER — Telehealth: Payer: Self-pay | Admitting: *Deleted

## 2021-09-04 NOTE — Telephone Encounter (Signed)
Nurtec pa signed by Dr Lucia Gaskins, then faxed to [facility] with last office note. Received a receipt of confirmation.

## 2021-09-04 NOTE — Telephone Encounter (Signed)
Nurtec PA, [place] [employer] Nurtec PA on MD desk for signature.

## 2021-10-09 NOTE — Telephone Encounter (Signed)
Confirmation [id number]  Benefit Plan:MCAID  Health Plan: [employer]  Prior Approval #: [id number]  Submission Date: 09/04/2021  Status: APPROVED  Effective Begin Date:09/04/2021  Effective End Date:08/30/2022

## 2021-11-07 ENCOUNTER — Encounter: Payer: Self-pay | Admitting: Family Medicine

## 2021-11-07 ENCOUNTER — Ambulatory Visit (INDEPENDENT_AMBULATORY_CARE_PROVIDER_SITE_OTHER): Payer: Medicaid Other | Admitting: Family Medicine

## 2021-11-07 VITALS — BP 110/80 | HR 101 | Temp 95.5°F | Resp 14 | Ht 64.0 in | Wt 190.0 lb

## 2021-11-07 DIAGNOSIS — Z23 Encounter for immunization: Secondary | ICD-10-CM

## 2021-11-07 DIAGNOSIS — G43009 Migraine without aura, not intractable, without status migrainosus: Secondary | ICD-10-CM | POA: Diagnosis not present

## 2021-11-07 DIAGNOSIS — K219 Gastro-esophageal reflux disease without esophagitis: Secondary | ICD-10-CM

## 2021-11-07 DIAGNOSIS — F840 Autistic disorder: Secondary | ICD-10-CM | POA: Diagnosis not present

## 2021-11-07 DIAGNOSIS — F330 Major depressive disorder, recurrent, mild: Secondary | ICD-10-CM

## 2021-11-07 DIAGNOSIS — E282 Polycystic ovarian syndrome: Secondary | ICD-10-CM

## 2021-11-07 DIAGNOSIS — E782 Mixed hyperlipidemia: Secondary | ICD-10-CM | POA: Diagnosis not present

## 2021-11-07 NOTE — Assessment & Plan Note (Signed)
Management per specialist. 

## 2021-11-07 NOTE — Assessment & Plan Note (Signed)
The current medical regimen is effective;  continue present plan and medications. Taking Nurtec 75 mg daily PRN, Propranolol 80 mg daily at bedtime.

## 2021-11-07 NOTE — Progress Notes (Unsigned)
Subjective:  Patient ID: Betty Rogers, female    DOB: Oct 08, 1995  Age: 26 y.o. MRN: [id number]  Chief Complaint  Patient presents with   Hyperlipidemia   Depression    HPI Depression: Patient is taking Buspar 10 mg twice a day, Fluoxetine 10 mg TID, Lorazepam 1 mg twice a day.  Patient sees psychiatry, [place], NP.  Migraine: Taking Nurtec 75 mg daily PRN, Propranolol 80 mg daily at bedtime, Emgallity once monthly. Working great.   PCOS: Takes Medroxyprogesterone  inject 3 months ago, Metformin 500 mg daily.  GERD: Taking Omeprazole 20 mg twice a day and pepcid IBS  Current Outpatient Medications on File Prior to Visit  Medication Sig Dispense Refill   ARIPiprazole (ABILIFY) 5 MG tablet Take 5 mg by mouth at bedtime.     busPIRone (BUSPAR) 10 MG tablet Take 10 mg by mouth 2 (two) times daily.     cyclobenzaprine (FLEXERIL) 5 MG tablet Take 1 tablet (5 mg total) by mouth at bedtime. 30 tablet 1   dicyclomine (BENTYL) 20 MG tablet Take 10 mg by mouth 4 (four) times daily -  before meals and at bedtime.      divalproex (DEPAKOTE ER) 250 MG 24 hr tablet Take 250 mg by mouth at bedtime.     FLUoxetine (PROZAC) 20 MG capsule Take 1 capsule (20 mg total) by mouth daily. 30 capsule 0   Galcanezumab-gnlm (EMGALITY) 120 MG/ML SOAJ INJECT 120MG  INTO THE SKIN EVERY 30 DAYS 11 mL 3   hydrOXYzine (VISTARIL) 25 MG capsule Take 25 mg by mouth at bedtime.     LORazepam (ATIVAN) 1 MG tablet Take 1 mg by mouth 2 (two) times daily.     medroxyPROGESTERone Acetate (DEPO-PROVERA IM) Inject into the muscle every 3 (three) months.     metFORMIN (GLUCOPHAGE) 500 MG tablet Take 1 tablet (500 mg total) by mouth daily with breakfast. 90 tablet 0   omeprazole (PRILOSEC) 20 MG capsule Take 20 mg by mouth 2 (two) times daily.     ondansetron (ZOFRAN-ODT) 4 MG disintegrating tablet Take 1-2 tablets (4-8 mg total) by mouth every 8 (eight) hours as needed. 30 tablet 6   prochlorperazine (COMPAZINE) 10 MG  tablet Take 10 mg by mouth 3 (three) times daily.     propranolol ER (INDERAL LA) 80 MG 24 hr capsule Take 1 capsule (80 mg total) by mouth at bedtime. 90 capsule 3   Rimegepant Sulfate (NURTEC) 75 MG TBDP Take 75 mg by mouth daily as needed. For migraines. Take as close to onset of migraine as possible. One daily maximum. 16 tablet 11   VITAMIN D PO Take 125 mcg by mouth daily.     No current facility-administered medications on file prior to visit.   Past Medical History:  Diagnosis Date   Agoraphobia 11/07/2016   Anxiety and depression    History of anxiety 03/29/2015   History of attention deficit hyperactivity disorder (ADHD) 03/29/2015   History of depression 03/29/2015   Migraines    Panic attacks    Psychosis (HCC) 07/18/2017   Past Surgical History:  Procedure Laterality Date   CHOLECYSTECTOMY     CHOLECYSTECTOMY     ESOPHAGOGASTRODUODENOSCOPY     REMOVAL BIRTH MARK ON HEAD   2008    Family History  Problem Relation Age of Onset   Heart attack Other    Cancer Other    Cancer Maternal Aunt        Pancreatic Stg II   Social  History   Socioeconomic History   Marital status: Single    Spouse name: Not on file   Number of children: Not on file   Years of education: Not on file   Highest education level: High school graduate  Occupational History   Not on file  Tobacco Use   Smoking status: Never   Smokeless tobacco: Never  Vaping Use   Vaping Use: Never used  Substance and Sexual Activity   Alcohol use: Yes    Comment: "a half a wine cooler occasionally"   Drug use: Never   Sexual activity: Not Currently    Birth control/protection: Abstinence, Injection  Other Topics Concern   Not on file  Social History Narrative   Lives at home with her parents   Right handed   No caffeine   Social Determinants of Corporate investment banker Strain: Not on file  Food Insecurity: Not on file  Transportation Needs: Not on file  Physical Activity: Not on file  Stress:  Not on file  Social Connections: Not on file    Review of Systems  Constitutional:  Negative for chills, fatigue and fever.  HENT:  Negative for congestion, ear pain and sore throat.   Respiratory:  Negative for cough and shortness of breath.   Cardiovascular:  Negative for chest pain and palpitations.  Gastrointestinal:  Positive for nausea. Negative for abdominal pain, constipation, diarrhea and vomiting.  Endocrine: Negative for polydipsia, polyphagia and polyuria.  Genitourinary:  Negative for difficulty urinating and dysuria.  Musculoskeletal:  Negative for arthralgias, back pain and myalgias.  Skin:  Negative for rash.  Neurological:  Negative for headaches.  Psychiatric/Behavioral:  Negative for dysphoric mood. The patient is nervous/anxious.      Objective:  BP 110/80   Pulse (!) 101   Temp (!) 95.5 F (35.3 C)   Resp 14   Ht 5\' 4"  (1.626 m)   Wt 190 lb (86.2 kg)   LMP  (LMP Unknown)   SpO2 96%   BMI 32.61 kg/m      11/07/2021    8:55 AM 08/26/2021    2:25 PM 07/02/2021    7:27 AM  BP/Weight  Systolic BP 110 110 92  Diastolic BP 80 72 60  Wt. (Lbs) 190 190 195.6  BMI 32.61 kg/m2 32.61 kg/m2 33.57 kg/m2    Physical Exam Vitals reviewed.  Constitutional:      Appearance: Normal appearance. She is normal weight.  Neck:     Vascular: No carotid bruit.  Cardiovascular:     Rate and Rhythm: Normal rate and regular rhythm.     Heart sounds: Normal heart sounds.  Pulmonary:     Effort: Pulmonary effort is normal. No respiratory distress.     Breath sounds: Normal breath sounds.  Abdominal:     General: Abdomen is flat. Bowel sounds are normal.     Palpations: Abdomen is soft.     Tenderness: There is no abdominal tenderness.  Neurological:     Mental Status: She is alert and oriented to person, place, and time.  Psychiatric:        Mood and Affect: Mood normal.        Behavior: Behavior normal.     Diabetic Foot Exam - Simple   No data filed       Lab Results  Component Value Date   WBC 7.2 07/31/2021   HGB 15.7 07/31/2021   HCT 45.6 07/31/2021   PLT 262 07/31/2021   GLUCOSE  88 07/31/2021   CHOL 169 07/31/2021   TRIG 170 (H) 07/31/2021   HDL 32 (L) 07/31/2021   LDLCALC 107 (H) 07/31/2021   ALT 33 (H) 07/31/2021   AST 29 07/31/2021   NA 145 (H) 07/31/2021   K 4.4 07/31/2021   CL 111 (H) 07/31/2021   CREATININE 0.99 07/31/2021   BUN 11 07/31/2021   CO2 20 07/31/2021   TSH 2.240 12/17/2020   HGBA1C 5.2 03/29/2021      Assessment & Plan:   Problem List Items Addressed This Visit       Cardiovascular and Mediastinum   Migraine without aura and without status migrainosus, not intractable     Endocrine   PCOS (polycystic ovarian syndrome)     Other   Autism spectrum disorder   Depression, major, recurrent, mild (HCC)   Mixed hyperlipidemia - Primary  .  No orders of the defined types were placed in this encounter.   No orders of the defined types were placed in this encounter.    Follow-up: No follow-ups on file.  An After Visit Summary was printed and given to the patient.  Blane Ohara, MD [facility] [phone]

## 2021-11-07 NOTE — Assessment & Plan Note (Signed)
Well controlled.   Continue to work on eating a healthy diet and exercise.  Labs drawn today.   

## 2021-11-08 LAB — LIPID PANEL
Chol/HDL Ratio: 4.3 ratio (ref 0.0–4.4)
Cholesterol, Total: 163 mg/dL (ref 100–199)
HDL: 38 mg/dL — ABNORMAL LOW (ref >39)
LDL Chol Calc (NIH): 103 mg/dL — ABNORMAL HIGH (ref 0–99)
Triglycerides: 121 mg/dL (ref 0–149)
VLDL Cholesterol Cal: 22 mg/dL (ref 5–40)

## 2021-11-08 LAB — CBC WITH DIFFERENTIAL/PLATELET
Basophils Absolute: 0 10*3/uL (ref 0.0–0.2)
Basos: 1 %
EOS (ABSOLUTE): 0.1 10*3/uL (ref 0.0–0.4)
Eos: 2 %
Hematocrit: 45.8 % (ref 34.0–46.6)
Hemoglobin: 15.7 g/dL (ref 11.1–15.9)
Immature Grans (Abs): 0 10*3/uL (ref 0.0–0.1)
Immature Granulocytes: 0 %
Lymphocytes Absolute: 3.1 10*3/uL (ref 0.7–3.1)
Lymphs: 44 %
MCH: 29.5 pg (ref 26.6–33.0)
MCHC: 34.3 g/dL (ref 31.5–35.7)
MCV: 86 fL (ref 79–97)
Monocytes Absolute: 0.6 10*3/uL (ref 0.1–0.9)
Monocytes: 9 %
Neutrophils Absolute: 3.1 10*3/uL (ref 1.4–7.0)
Neutrophils: 44 %
Platelets: 216 10*3/uL (ref 150–450)
RBC: 5.33 x10E6/uL — ABNORMAL HIGH (ref 3.77–5.28)
RDW: 12.7 % (ref 11.7–15.4)
WBC: 7 10*3/uL (ref 3.4–10.8)

## 2021-11-08 LAB — COMPREHENSIVE METABOLIC PANEL
ALT: 19 IU/L (ref 0–32)
AST: 18 IU/L (ref 0–40)
Albumin/Globulin Ratio: 2.4 — ABNORMAL HIGH (ref 1.2–2.2)
Albumin: 4.6 g/dL (ref 4.0–5.0)
Alkaline Phosphatase: 68 IU/L (ref 44–121)
BUN/Creatinine Ratio: 10 (ref 9–23)
BUN: 8 mg/dL (ref 6–20)
Bilirubin Total: 1.1 mg/dL (ref 0.0–1.2)
CO2: 17 mmol/L — ABNORMAL LOW (ref 20–29)
Calcium: 9.6 mg/dL (ref 8.7–10.2)
Chloride: 109 mmol/L — ABNORMAL HIGH (ref 96–106)
Creatinine, Ser: 0.84 mg/dL (ref 0.57–1.00)
Globulin, Total: 1.9 g/dL (ref 1.5–4.5)
Glucose: 89 mg/dL (ref 70–99)
Potassium: 4.2 mmol/L (ref 3.5–5.2)
Sodium: 143 mmol/L (ref 134–144)
Total Protein: 6.5 g/dL (ref 6.0–8.5)
eGFR: 98 mL/min/{1.73_m2} (ref >59)

## 2021-11-08 LAB — HEMOGLOBIN A1C
Est. average glucose Bld gHb Est-mCnc: 97 mg/dL
Hgb A1c MFr Bld: 5 % (ref 4.8–5.6)

## 2021-11-10 DIAGNOSIS — Z23 Encounter for immunization: Secondary | ICD-10-CM | POA: Insufficient documentation

## 2021-11-10 DIAGNOSIS — K219 Gastro-esophageal reflux disease without esophagitis: Secondary | ICD-10-CM | POA: Insufficient documentation

## 2021-11-10 NOTE — Assessment & Plan Note (Signed)
Continue depoprovera every 3 months and metformin 500 mg daily.

## 2021-11-10 NOTE — Assessment & Plan Note (Signed)
Management per specialist. 

## 2021-11-10 NOTE — Assessment & Plan Note (Signed)
Continue omeprazole 20 mg twice daily and pepcid otc.

## 2022-02-14 ENCOUNTER — Encounter: Payer: Self-pay | Admitting: Family Medicine

## 2022-02-14 ENCOUNTER — Ambulatory Visit (INDEPENDENT_AMBULATORY_CARE_PROVIDER_SITE_OTHER): Payer: Medicaid Other | Admitting: Family Medicine

## 2022-02-14 VITALS — BP 94/64 | HR 78 | Temp 97.1°F | Resp 18 | Ht 64.0 in | Wt 193.0 lb

## 2022-02-14 DIAGNOSIS — G43009 Migraine without aura, not intractable, without status migrainosus: Secondary | ICD-10-CM

## 2022-02-14 DIAGNOSIS — M542 Cervicalgia: Secondary | ICD-10-CM

## 2022-02-14 DIAGNOSIS — K219 Gastro-esophageal reflux disease without esophagitis: Secondary | ICD-10-CM

## 2022-02-14 DIAGNOSIS — M5481 Occipital neuralgia: Secondary | ICD-10-CM | POA: Diagnosis not present

## 2022-02-14 DIAGNOSIS — E782 Mixed hyperlipidemia: Secondary | ICD-10-CM | POA: Diagnosis not present

## 2022-02-14 DIAGNOSIS — F840 Autistic disorder: Secondary | ICD-10-CM

## 2022-02-14 DIAGNOSIS — Z79899 Other long term (current) drug therapy: Secondary | ICD-10-CM

## 2022-02-14 MED ORDER — CYCLOBENZAPRINE HCL 5 MG PO TABS: 5.0000 mg | ORAL_TABLET | Freq: Three times a day (TID) | ORAL | 0 refills | Status: DC | PRN

## 2022-02-14 NOTE — Patient Instructions (Signed)
Recommend start dicyclomine  before meals and before bed as needed nausea/reflux.

## 2022-02-14 NOTE — Progress Notes (Signed)
Subjective:  Patient ID: Betty Rogers, female    DOB: 09-07-1995  Age: 26 y.o. MRN: [id number]  Chief Complaint  Patient presents with   Hyperlipidemia   Gastroesophageal Reflux   polycystic ovarian syndrome     HPI Patient is a 26 year old white female with past medical history of PCOS, depression, autism spectrum disorder, and migraines, who presents for chronic follow-up.  Patient is taking Emgality every 2 weeks and nurtec as neededal issues which was likely IBS-D.  Patient was on Bentyl 20 mg 1 p.o. before every meal and nightly. Patient has had nausea x 1 week and is now moving in to reflux worsening.  Omeprazole 20 mg twice daily and famotidine 20 mg twice daily.  Taking zofran. Managed by GI  In addition, she sees psychiatry and her depression is well controlled.  She is currently on Abilify 5 mg nightly, BuSpar 10 mg twice daily, fluoxetine 20 mg daily, Depakote 250 mg nightly, hydroxyzine 25 mg nightly and lorazepam 1 mg twice daily.   PCOS: On metformin 500 mg daily.     02/14/2022    9:27 AM 11/07/2021    9:01 AM 07/02/2021    7:32 AM  PHQ9 SCORE ONLY  PHQ-9 Total Score 3 0 5    Increased anxiety.    Current Outpatient Medications on File Prior to Visit     Current Outpatient Medications on File Prior to Visit  Medication Sig Dispense Refill   ARIPiprazole (ABILIFY) 5 MG tablet Take 5 mg by mouth at bedtime.     busPIRone (BUSPAR) 10 MG tablet Take 10 mg by mouth 2 (two) times daily.     dicyclomine (BENTYL) 20 MG tablet Take 10 mg by mouth 4 (four) times daily -  before meals and at bedtime.      divalproex (DEPAKOTE ER) 250 MG 24 hr tablet Take 250 mg by mouth at bedtime.     FLUoxetine (PROZAC) 20 MG capsule Take 1 capsule (20 mg total) by mouth daily. 30 capsule 0   Galcanezumab-gnlm (EMGALITY) 120 MG/ML SOAJ INJECT 120MG  INTO THE SKIN EVERY 30 DAYS 11 mL 3   hydrOXYzine (VISTARIL) 25 MG capsule Take 25 mg by mouth at bedtime.     LORazepam (ATIVAN) 1 MG  tablet Take 1 mg by mouth 2 (two) times daily.     medroxyPROGESTERone (DEPO-PROVERA) 150 MG/ML injection Inject 150 mg into the muscle every 3 (three) months.     medroxyPROGESTERone Acetate (DEPO-PROVERA IM) Inject into the muscle every 3 (three) months.     metFORMIN (GLUCOPHAGE) 500 MG tablet Take 1 tablet (500 mg total) by mouth daily with breakfast. 90 tablet 0   omeprazole (PRILOSEC) 20 MG capsule Take 20 mg by mouth 2 (two) times daily.     ondansetron (ZOFRAN-ODT) 4 MG disintegrating tablet Take 1-2 tablets (4-8 mg total) by mouth every 8 (eight) hours as needed. 30 tablet 6   prochlorperazine (COMPAZINE) 10 MG tablet Take 10 mg by mouth 3 (three) times daily.     propranolol ER (INDERAL LA) 80 MG 24 hr capsule Take 1 capsule (80 mg total) by mouth at bedtime. 90 capsule 3   Rimegepant Sulfate (NURTEC) 75 MG TBDP Take 75 mg by mouth daily as needed. For migraines. Take as close to onset of migraine as possible. One daily maximum. 16 tablet 11   VITAMIN D PO Take 125 mcg by mouth daily. (Patient not taking: Reported on 02/14/2022)     No current facility-administered medications on file  prior to visit.   Past Medical History:  Diagnosis Date   Agoraphobia 11/07/2016   Anxiety and depression    History of anxiety 03/29/2015   History of attention deficit hyperactivity disorder (ADHD) 03/29/2015   History of depression 03/29/2015   Migraines    Panic attacks    Psychosis (HCC) 07/18/2017   Past Surgical History:  Procedure Laterality Date   CHOLECYSTECTOMY     CHOLECYSTECTOMY     ESOPHAGOGASTRODUODENOSCOPY     REMOVAL BIRTH MARK ON HEAD   2008    Family History  Problem Relation Age of Onset   Heart attack Other    Cancer Other    Cancer Maternal Aunt        Pancreatic Stg II   Social History   Socioeconomic History   Marital status: Single    Spouse name: Not on file   Number of children: Not on file   Years of education: Not on file   Highest education level: High  school graduate  Occupational History   Not on file  Tobacco Use   Smoking status: Never   Smokeless tobacco: Never  Vaping Use   Vaping Use: Never used  Substance and Sexual Activity   Alcohol use: Yes    Comment: "a half a wine cooler occasionally"   Drug use: Never   Sexual activity: Not Currently    Birth control/protection: Abstinence, Injection  Other Topics Concern   Not on file  Social History Narrative   Lives at home with her parents   Right handed   No caffeine   Social Determinants of Corporate investment banker Strain: Not on file  Food Insecurity: Not on file  Transportation Needs: Not on file  Physical Activity: Not on file  Stress: Not on file  Social Connections: Not on file    Review of Systems  Constitutional:  Positive for fatigue. Negative for chills and fever.  HENT:  Negative for congestion, rhinorrhea and sore throat.   Respiratory:  Negative for cough and shortness of breath.   Cardiovascular:  Positive for chest pain (burning discomfort).  Gastrointestinal:  Positive for nausea. Negative for abdominal pain, constipation, diarrhea and vomiting.  Genitourinary:  Negative for dysuria and urgency.  Musculoskeletal:  Positive for back pain. Negative for myalgias.  Neurological:  Positive for headaches. Negative for dizziness, weakness and light-headedness.  Psychiatric/Behavioral:  Positive for dysphoric mood. The patient is not nervous/anxious.      Objective:  BP 94/64   Pulse 78   Temp (!) 97.1 F (36.2 C)   Resp 18   Ht 5\' 4"  (1.626 m)   Wt 193 lb (87.5 kg)   BMI 33.13 kg/m      02/14/2022    9:23 AM 11/07/2021    8:55 AM 08/26/2021    2:25 PM  BP/Weight  Systolic BP 94 110 110  Diastolic BP 64 80 72  Wt. (Lbs) 193 190 190  BMI 33.13 kg/m2 32.61 kg/m2 32.61 kg/m2    Physical Exam Vitals reviewed.  Constitutional:      Appearance: Normal appearance. She is obese.  Neck:     Vascular: No carotid bruit.  Cardiovascular:      Rate and Rhythm: Normal rate and regular rhythm.     Heart sounds: Normal heart sounds.  Pulmonary:     Effort: Pulmonary effort is normal. No respiratory distress.     Breath sounds: Normal breath sounds.  Abdominal:     General: Abdomen is flat.  Bowel sounds are normal.     Palpations: Abdomen is soft.     Tenderness: There is no abdominal tenderness.  Neurological:     Mental Status: She is alert and oriented to person, place, and time.  Psychiatric:        Mood and Affect: Mood normal.        Behavior: Behavior normal.     Diabetic Foot Exam - Simple   No data filed      Lab Results  Component Value Date   WBC 6.9 02/14/2022   HGB 16.5 (H) 02/14/2022   HCT 48.4 (H) 02/14/2022   PLT 261 02/14/2022   GLUCOSE 97 02/14/2022   CHOL 181 02/14/2022   TRIG 116 02/14/2022   HDL 37 (L) 02/14/2022   LDLCALC 123 (H) 02/14/2022   ALT 25 02/14/2022   AST 22 02/14/2022   NA 145 (H) 02/14/2022   K 5.0 02/14/2022   CL 108 (H) 02/14/2022   CREATININE 0.85 02/14/2022   BUN 11 02/14/2022   CO2 21 02/14/2022   TSH 2.240 12/17/2020   HGBA1C 5.1 02/14/2022      Assessment & Plan:   Problem List Items Addressed This Visit       Cardiovascular and Mediastinum   Migraine without aura and without status migrainosus, not intractable    Greatly improved  The current medical regimen is effective;  continue present plan and medications.       Relevant Medications   cyclobenzaprine (FLEXERIL) 5 MG tablet     Digestive   Gastroesophageal reflux disease without esophagitis    Continue current medications.  Recommend try bentyl before meals and before bed.         Nervous and Auditory   Bilateral occipital neuralgia    Management by neurology      Relevant Medications   cyclobenzaprine (FLEXERIL) 5 MG tablet     Other   Autism spectrum disorder    Continue to see psychiatry      Mixed hyperlipidemia - Primary    Recommend continue to work on eating healthy diet and  exercise..       Relevant Orders   Lipid panel (Completed)   Cardiovascular Risk Assessment (Completed)   Cervicalgia    Management by neurology      Relevant Medications   cyclobenzaprine (FLEXERIL) 5 MG tablet   Encounter for long-term (current) use of medications    Check labs requested by psychiatry      Relevant Medications   medroxyPROGESTERone (DEPO-PROVERA) 150 MG/ML injection   Other Relevant Orders   CBC (Completed)   Comprehensive metabolic panel (Completed)   Hemoglobin A1c (Completed)  .  Meds ordered this encounter  Medications   cyclobenzaprine (FLEXERIL) 5 MG tablet    Sig: Take 1 tablet (5 mg total) by mouth 3 (three) times daily as needed for muscle spasms.    Dispense:  90 tablet    Refill:  0    Orders Placed This Encounter  Procedures   CBC   Comprehensive metabolic panel   Lipid panel   Hemoglobin A1c   Cardiovascular Risk Assessment     Follow-up: Return in about 3 months (around 05/16/2022) for chronic fasting.  An After Visit Summary was printed and given to the patient.  Blane Ohara, MD [facility] [phone]

## 2022-02-15 LAB — COMPREHENSIVE METABOLIC PANEL WITH GFR
ALT: 25 IU/L (ref 0–32)
AST: 22 IU/L (ref 0–40)
Albumin/Globulin Ratio: 2 (ref 1.2–2.2)
Albumin: 4.7 g/dL (ref 4.0–5.0)
Alkaline Phosphatase: 72 IU/L (ref 44–121)
BUN/Creatinine Ratio: 13 (ref 9–23)
BUN: 11 mg/dL (ref 6–20)
Bilirubin Total: 1.2 mg/dL (ref 0.0–1.2)
CO2: 21 mmol/L (ref 20–29)
Calcium: 10.2 mg/dL (ref 8.7–10.2)
Chloride: 108 mmol/L — ABNORMAL HIGH (ref 96–106)
Creatinine, Ser: 0.85 mg/dL (ref 0.57–1.00)
Globulin, Total: 2.4 g/dL (ref 1.5–4.5)
Glucose: 97 mg/dL (ref 70–99)
Potassium: 5 mmol/L (ref 3.5–5.2)
Sodium: 145 mmol/L — ABNORMAL HIGH (ref 134–144)
Total Protein: 7.1 g/dL (ref 6.0–8.5)
eGFR: 96 mL/min/1.73

## 2022-02-15 LAB — CBC
Hematocrit: 48.4 % — ABNORMAL HIGH (ref 34.0–46.6)
Hemoglobin: 16.5 g/dL — ABNORMAL HIGH (ref 11.1–15.9)
MCH: 29.3 pg (ref 26.6–33.0)
MCHC: 34.1 g/dL (ref 31.5–35.7)
MCV: 86 fL (ref 79–97)
Platelets: 261 x10E3/uL (ref 150–450)
RBC: 5.63 x10E6/uL — ABNORMAL HIGH (ref 3.77–5.28)
RDW: 12.5 % (ref 11.7–15.4)
WBC: 6.9 x10E3/uL (ref 3.4–10.8)

## 2022-02-15 LAB — HEMOGLOBIN A1C
Est. average glucose Bld gHb Est-mCnc: 100 mg/dL
Hgb A1c MFr Bld: 5.1 % (ref 4.8–5.6)

## 2022-02-15 LAB — LIPID PANEL
Chol/HDL Ratio: 4.9 ratio — ABNORMAL HIGH (ref 0.0–4.4)
Cholesterol, Total: 181 mg/dL (ref 100–199)
HDL: 37 mg/dL — ABNORMAL LOW (ref >39)
LDL Chol Calc (NIH): 123 mg/dL — ABNORMAL HIGH (ref 0–99)
Triglycerides: 116 mg/dL (ref 0–149)
VLDL Cholesterol Cal: 21 mg/dL (ref 5–40)

## 2022-02-15 LAB — CARDIOVASCULAR RISK ASSESSMENT

## 2022-02-16 NOTE — Progress Notes (Signed)
Blood count normal.  Liver function normal.  Kidney function normal.  Cholesterol: LDL up a little and HDL low.  Recommend low fat diet and exercise. Recommend 45 minutes 5 days a week.  HBA1C: 5.1. normal

## 2022-02-23 DIAGNOSIS — M5481 Occipital neuralgia: Secondary | ICD-10-CM | POA: Insufficient documentation

## 2022-02-23 DIAGNOSIS — Z79899 Other long term (current) drug therapy: Secondary | ICD-10-CM | POA: Insufficient documentation

## 2022-02-23 NOTE — Assessment & Plan Note (Signed)
Management by neurology 

## 2022-02-23 NOTE — Assessment & Plan Note (Signed)
Management by neurology

## 2022-02-23 NOTE — Assessment & Plan Note (Signed)
Continue current medications.  Recommend try bentyl before meals and before bed.

## 2022-02-23 NOTE — Assessment & Plan Note (Signed)
Check labs requested by psychiatry

## 2022-02-23 NOTE — Assessment & Plan Note (Signed)
Greatly improved.  °The current medical regimen is effective;  continue present plan and medications. ° °

## 2022-02-23 NOTE — Assessment & Plan Note (Signed)
Recommend continue to work on eating healthy diet and exercise.  

## 2022-02-23 NOTE — Assessment & Plan Note (Signed)
Continue to see psychiatry

## 2022-03-28 ENCOUNTER — Encounter: Payer: Self-pay | Admitting: Family Medicine

## 2022-03-28 ENCOUNTER — Ambulatory Visit (INDEPENDENT_AMBULATORY_CARE_PROVIDER_SITE_OTHER): Payer: Medicaid Other | Admitting: Family Medicine

## 2022-03-28 VITALS — BP 96/70 | HR 101 | Temp 98.0°F | Resp 16 | Ht 64.0 in | Wt 196.0 lb

## 2022-03-28 DIAGNOSIS — R198 Other specified symptoms and signs involving the digestive system and abdomen: Secondary | ICD-10-CM

## 2022-03-28 DIAGNOSIS — R1084 Generalized abdominal pain: Secondary | ICD-10-CM

## 2022-03-28 DIAGNOSIS — R112 Nausea with vomiting, unspecified: Secondary | ICD-10-CM

## 2022-03-28 HISTORY — DX: Generalized abdominal pain: R10.84

## 2022-03-28 NOTE — Assessment & Plan Note (Signed)
Start on miralax one pack twice daily as needed constipation/abdominal pain. I would recommend taking twice daily for next 3 days to hopefully reset your bowels.  

## 2022-03-28 NOTE — Progress Notes (Signed)
Acute Office Visit  Subjective:    Patient ID: Betty Rogers, female    DOB: Oct 28, 1995, 28 y.o.   MRN: [id number]  Chief Complaint  Patient presents with   Abdominal Pain   Emesis    HPI: Patient is in today for abdomen cramping pain since one week ago on both sided of the abdomen without radiation. She mentioned that her pain was so high that she vomited. She has not tried any medication. She denies fever, chills, diarrhea. Last BM was yesterday. Pain is not constant. Nothing makes it worse. Tried otc tums, pamprin without help. Not on her menses. No regular menses because on depo. Bms are normally watery and 2-3 times per day. However, in the last week, they are now formed. Last one was last night. Still 2-3 times per day. Not straining. Pain is not relieved by having BM.    Past Medical History:  Diagnosis Date   Agoraphobia 11/07/2016   Anxiety and depression    History of anxiety 03/29/2015   History of attention deficit hyperactivity disorder (ADHD) 03/29/2015   History of depression 03/29/2015   Migraines    Panic attacks    Psychosis ([place]) 07/18/2017    Past Surgical History:  Procedure Laterality Date   CHOLECYSTECTOMY     CHOLECYSTECTOMY     ESOPHAGOGASTRODUODENOSCOPY     REMOVAL BIRTH MARK ON HEAD   2008    Family History  Problem Relation Age of Onset   Heart attack Other    Cancer Other    Cancer Maternal Aunt        Pancreatic Stg II    Social History   Socioeconomic History   Marital status: Single    Spouse name: Not on file   Number of children: Not on file   Years of education: Not on file   Highest education level: High school graduate  Occupational History   Not on file  Tobacco Use   Smoking status: Never   Smokeless tobacco: Never  Vaping Use   Vaping Use: Never used  Substance and Sexual Activity   Alcohol use: Yes    Comment: "a half a wine cooler occasionally"   Drug use: Never   Sexual activity: Not Currently    Birth  control/protection: Abstinence, Injection  Other Topics Concern   Not on file  Social History Narrative   Lives at home with her parents   Right handed   No caffeine   Social Determinants of Radio broadcast assistant Strain: Not on file  Food Insecurity: Not on file  Transportation Needs: Not on file  Physical Activity: Not on file  Stress: Not on file  Social Connections: Not on file  Intimate Partner Violence: Not on file    Outpatient Medications Prior to Visit  Medication Sig Dispense Refill   ARIPiprazole (ABILIFY) 5 MG tablet Take 5 mg by mouth at bedtime.     busPIRone (BUSPAR) 10 MG tablet Take 10 mg by mouth 2 (two) times daily.     cyclobenzaprine (FLEXERIL) 5 MG tablet Take 1 tablet (5 mg total) by mouth 3 (three) times daily as needed for muscle spasms. 90 tablet 0   dicyclomine (BENTYL) 20 MG tablet Take 10 mg by mouth 4 (four) times daily -  before meals and at bedtime.      divalproex (DEPAKOTE ER) 250 MG 24 hr tablet Take 250 mg by mouth at bedtime.     famotidine (PEPCID) 20 MG tablet Take 20 mg  by mouth 2 (two) times daily.     FLUoxetine (PROZAC) 20 MG capsule Take 1 capsule (20 mg total) by mouth daily. 30 capsule 0   Galcanezumab-gnlm (EMGALITY) 120 MG/ML SOAJ INJECT 120MG  INTO THE SKIN EVERY 30 DAYS 11 mL 3   hydrOXYzine (VISTARIL) 25 MG capsule Take 25 mg by mouth at bedtime.     LORazepam (ATIVAN) 1 MG tablet Take 1 mg by mouth 2 (two) times daily.     medroxyPROGESTERone (DEPO-PROVERA) 150 MG/ML injection Inject 150 mg into the muscle every 3 (three) months.     metFORMIN (GLUCOPHAGE) 500 MG tablet Take 1 tablet (500 mg total) by mouth daily with breakfast. 90 tablet 0   omeprazole (PRILOSEC) 20 MG capsule Take 20 mg by mouth 2 (two) times daily.     ondansetron (ZOFRAN-ODT) 4 MG disintegrating tablet Take 1-2 tablets (4-8 mg total) by mouth every 8 (eight) hours as needed. 30 tablet 6   propranolol ER (INDERAL LA) 80 MG 24 hr capsule Take 1 capsule (80  mg total) by mouth at bedtime. 90 capsule 3   Rimegepant Sulfate (NURTEC) 75 MG TBDP Take 75 mg by mouth daily as needed. For migraines. Take as close to onset of migraine as possible. One daily maximum. 16 tablet 11   VITAMIN D PO Take 125 mcg by mouth daily.     medroxyPROGESTERone Acetate (DEPO-PROVERA IM) Inject into the muscle every 3 (three) months.     prochlorperazine (COMPAZINE) 10 MG tablet Take 10 mg by mouth 3 (three) times daily.     No facility-administered medications prior to visit.    Allergies  Allergen Reactions   Amitriptyline     Worse headaches   Milk ([place])    Latex Rash   Nickel Rash   Other Itching    Metal     Review of Systems  Constitutional:  Negative for chills, fatigue and fever.  HENT:  Negative for congestion, ear pain and sore throat.   Respiratory:  Negative for cough and shortness of breath.   Cardiovascular:  Negative for chest pain and palpitations.  Gastrointestinal:  Positive for abdominal pain, nausea and vomiting. Negative for constipation and diarrhea.  Endocrine: Negative for polydipsia, polyphagia and polyuria.  Genitourinary:  Negative for difficulty urinating and dysuria.  Musculoskeletal:  Negative for arthralgias, back pain and myalgias.  Skin:  Negative for rash.  Neurological:  Negative for headaches.  Psychiatric/Behavioral:  Negative for dysphoric mood. The patient is not nervous/anxious.        Objective:        03/28/2022    8:12 AM 02/14/2022    9:23 AM 11/07/2021    8:55 AM  Vitals with BMI  Height 5\' 4"  5\' 4"  5\' 4"   Weight 196 lbs 193 lbs 190 lbs  BMI 33.63 33.11 32.6  Systolic 96 94 110  Diastolic 70 64 80  Pulse 101 78 101    No data found.   Physical Exam Vitals reviewed.  Constitutional:      Appearance: Normal appearance. She is well-developed.  Cardiovascular:     Rate and Rhythm: Normal rate and regular rhythm.     Heart sounds: Normal heart sounds.  Pulmonary:     Effort: Pulmonary effort is  normal. No respiratory distress.     Breath sounds: Normal breath sounds.  Abdominal:     General: Abdomen is flat. Bowel sounds are normal.     Palpations: Abdomen is soft.     Tenderness: There is  abdominal tenderness (left side of abdomen. Full. right side abd normal.). There is no right CVA tenderness, left CVA tenderness, guarding or rebound.  Neurological:     Mental Status: She is alert and oriented to person, place, and time.  Psychiatric:        Mood and Affect: Mood normal.        Behavior: Behavior normal.     Health Maintenance Due  Topic Date Due   PAP-Cervical Cytology Screening  Never done   PAP SMEAR-Modifier  Never done    There are no preventive care reminders to display for this patient.   Lab Results  Component Value Date   TSH 2.240 12/17/2020   Lab Results  Component Value Date   WBC 6.9 02/14/2022   HGB 16.5 (H) 02/14/2022   HCT 48.4 (H) 02/14/2022   MCV 86 02/14/2022   PLT 261 02/14/2022   Lab Results  Component Value Date   NA 145 (H) 02/14/2022   K 5.0 02/14/2022   CO2 21 02/14/2022   GLUCOSE 97 02/14/2022   BUN 11 02/14/2022   CREATININE 0.85 02/14/2022   BILITOT 1.2 02/14/2022   ALKPHOS 72 02/14/2022   AST 22 02/14/2022   ALT 25 02/14/2022   PROT 7.1 02/14/2022   ALBUMIN 4.7 02/14/2022   CALCIUM 10.2 02/14/2022   ANIONGAP 13 07/18/2017   EGFR 96 02/14/2022   Lab Results  Component Value Date   CHOL 181 02/14/2022   Lab Results  Component Value Date   HDL 37 (L) 02/14/2022   Lab Results  Component Value Date   LDLCALC 123 (H) 02/14/2022   Lab Results  Component Value Date   TRIG 116 02/14/2022   Lab Results  Component Value Date   CHOLHDL 4.9 (H) 02/14/2022   Lab Results  Component Value Date   HGBA1C 5.1 02/14/2022       Assessment & Plan:  Abdominal pain, generalized Start on miralax one pack twice daily as needed constipation/abdominal pain. I would recommend taking twice daily for next 3 days to hopefully  reset your bowels.   Change in bowel movement Patient is having relative constipation.  Start on miralax one pack twice daily as needed constipation/abdominal pain. I would recommend taking twice daily for next 3 days to hopefully reset your bowels.     No orders of the defined types were placed in this encounter.   Orders Placed This Encounter  Procedures   CBC with Differential/Platelet   Comprehensive metabolic panel   Amylase   Lipase     Follow-up: Return if symptoms worsen or fail to improve.  An After Visit Summary was printed and given to the patient.  Rochel Brome, MD [facility] [phone]

## 2022-03-28 NOTE — Assessment & Plan Note (Signed)
Patient is having relative constipation.  Start on miralax one pack twice daily as needed constipation/abdominal pain. I would recommend taking twice daily for next 3 days to hopefully reset your bowels.

## 2022-03-28 NOTE — Patient Instructions (Addendum)
Start on miralax one pack twice daily as needed constipation/abdominal pain. I would recommend taking twice daily for next 3 days to hopefully reset your bowels.

## 2022-03-29 LAB — CBC WITH DIFFERENTIAL/PLATELET
Basophils Absolute: 0 10*3/uL (ref 0.0–0.2)
Basos: 0 %
EOS (ABSOLUTE): 0.1 10*3/uL (ref 0.0–0.4)
Eos: 1 %
Hematocrit: 46.8 % — ABNORMAL HIGH (ref 34.0–46.6)
Hemoglobin: 15.6 g/dL (ref 11.1–15.9)
Immature Grans (Abs): 0 10*3/uL (ref 0.0–0.1)
Immature Granulocytes: 0 %
Lymphocytes Absolute: 2.9 10*3/uL (ref 0.7–3.1)
Lymphs: 43 %
MCH: 28.9 pg (ref 26.6–33.0)
MCHC: 33.3 g/dL (ref 31.5–35.7)
MCV: 87 fL (ref 79–97)
Monocytes Absolute: 0.6 10*3/uL (ref 0.1–0.9)
Monocytes: 8 %
Neutrophils Absolute: 3.2 10*3/uL (ref 1.4–7.0)
Neutrophils: 48 %
Platelets: 232 10*3/uL (ref 150–450)
RBC: 5.39 x10E6/uL — ABNORMAL HIGH (ref 3.77–5.28)
RDW: 13.4 % (ref 11.7–15.4)
WBC: 6.8 10*3/uL (ref 3.4–10.8)

## 2022-03-29 LAB — COMPREHENSIVE METABOLIC PANEL
ALT: 105 IU/L — ABNORMAL HIGH (ref 0–32)
AST: 23 IU/L (ref 0–40)
Albumin/Globulin Ratio: 2 (ref 1.2–2.2)
Albumin: 4.4 g/dL (ref 4.0–5.0)
Alkaline Phosphatase: 86 IU/L (ref 44–121)
BUN/Creatinine Ratio: 13 (ref 9–23)
BUN: 11 mg/dL (ref 6–20)
Bilirubin Total: 0.9 mg/dL (ref 0.0–1.2)
CO2: 16 mmol/L — ABNORMAL LOW (ref 20–29)
Calcium: 9.4 mg/dL (ref 8.7–10.2)
Chloride: 112 mmol/L — ABNORMAL HIGH (ref 96–106)
Creatinine, Ser: 0.85 mg/dL (ref 0.57–1.00)
Globulin, Total: 2.2 g/dL (ref 1.5–4.5)
Glucose: 95 mg/dL (ref 70–99)
Potassium: 4.3 mmol/L (ref 3.5–5.2)
Sodium: 142 mmol/L (ref 134–144)
Total Protein: 6.6 g/dL (ref 6.0–8.5)
eGFR: 96 mL/min/{1.73_m2} (ref >59)

## 2022-03-29 LAB — AMYLASE: Amylase: 179 U/L — ABNORMAL HIGH (ref 31–110)

## 2022-03-29 LAB — LIPASE: Lipase: 135 U/L — ABNORMAL HIGH (ref 14–72)

## 2022-03-31 ENCOUNTER — Other Ambulatory Visit: Payer: Self-pay | Admitting: Family Medicine

## 2022-03-31 DIAGNOSIS — R948 Abnormal results of function studies of other organs and systems: Secondary | ICD-10-CM

## 2022-03-31 DIAGNOSIS — R1013 Epigastric pain: Secondary | ICD-10-CM

## 2022-04-01 ENCOUNTER — Other Ambulatory Visit: Payer: Medicaid Other

## 2022-04-01 ENCOUNTER — Encounter: Payer: Self-pay | Admitting: Family Medicine

## 2022-04-01 DIAGNOSIS — R948 Abnormal results of function studies of other organs and systems: Secondary | ICD-10-CM

## 2022-04-01 DIAGNOSIS — R1013 Epigastric pain: Secondary | ICD-10-CM

## 2022-04-02 LAB — COMPREHENSIVE METABOLIC PANEL
ALT: 54 IU/L — ABNORMAL HIGH (ref 0–32)
AST: 24 IU/L (ref 0–40)
Albumin/Globulin Ratio: 2 (ref 1.2–2.2)
Albumin: 4.7 g/dL (ref 4.0–5.0)
Alkaline Phosphatase: 81 IU/L (ref 44–121)
BUN/Creatinine Ratio: 11 (ref 9–23)
BUN: 10 mg/dL (ref 6–20)
Bilirubin Total: 1.8 mg/dL — ABNORMAL HIGH (ref 0.0–1.2)
CO2: 18 mmol/L — ABNORMAL LOW (ref 20–29)
Calcium: 10.1 mg/dL (ref 8.7–10.2)
Chloride: 107 mmol/L — ABNORMAL HIGH (ref 96–106)
Creatinine, Ser: 0.93 mg/dL (ref 0.57–1.00)
Globulin, Total: 2.3 g/dL (ref 1.5–4.5)
Glucose: 86 mg/dL (ref 70–99)
Potassium: 4.4 mmol/L (ref 3.5–5.2)
Sodium: 141 mmol/L (ref 134–144)
Total Protein: 7 g/dL (ref 6.0–8.5)
eGFR: 86 mL/min/{1.73_m2} (ref >59)

## 2022-04-02 LAB — CBC WITH DIFFERENTIAL/PLATELET
Basophils Absolute: 0 10*3/uL (ref 0.0–0.2)
Basos: 0 %
EOS (ABSOLUTE): 0.1 10*3/uL (ref 0.0–0.4)
Eos: 1 %
Hematocrit: 46.8 % — ABNORMAL HIGH (ref 34.0–46.6)
Hemoglobin: 16.1 g/dL — ABNORMAL HIGH (ref 11.1–15.9)
Immature Grans (Abs): 0 10*3/uL (ref 0.0–0.1)
Immature Granulocytes: 0 %
Lymphocytes Absolute: 3.1 10*3/uL (ref 0.7–3.1)
Lymphs: 41 %
MCH: 29.1 pg (ref 26.6–33.0)
MCHC: 34.4 g/dL (ref 31.5–35.7)
MCV: 85 fL (ref 79–97)
Monocytes Absolute: 0.6 10*3/uL (ref 0.1–0.9)
Monocytes: 8 %
Neutrophils Absolute: 3.7 10*3/uL (ref 1.4–7.0)
Neutrophils: 50 %
Platelets: 251 10*3/uL (ref 150–450)
RBC: 5.54 x10E6/uL — ABNORMAL HIGH (ref 3.77–5.28)
RDW: 12.7 % (ref 11.7–15.4)
WBC: 7.4 10*3/uL (ref 3.4–10.8)

## 2022-04-02 LAB — GAMMA GT: GGT: 97 IU/L — ABNORMAL HIGH (ref 0–60)

## 2022-04-02 LAB — LIPASE: Lipase: 113 U/L — ABNORMAL HIGH (ref 14–72)

## 2022-04-02 LAB — AMYLASE: Amylase: 192 U/L — ABNORMAL HIGH (ref 31–110)

## 2022-04-03 ENCOUNTER — Other Ambulatory Visit: Payer: Self-pay

## 2022-04-03 DIAGNOSIS — R948 Abnormal results of function studies of other organs and systems: Secondary | ICD-10-CM

## 2022-04-03 DIAGNOSIS — R1013 Epigastric pain: Secondary | ICD-10-CM

## 2022-04-04 LAB — SPECIMEN STATUS REPORT

## 2022-04-04 LAB — HCV INTERPRETATION

## 2022-04-04 LAB — ACUTE VIRAL HEPATITIS (HAV, HBV, HCV)
HCV Ab: NONREACTIVE
Hep A IgM: NEGATIVE
Hep B C IgM: NEGATIVE
Hepatitis B Surface Ag: NEGATIVE

## 2022-04-15 ENCOUNTER — Encounter: Payer: Self-pay | Admitting: Family Medicine

## 2022-04-15 ENCOUNTER — Other Ambulatory Visit: Payer: Self-pay

## 2022-04-15 ENCOUNTER — Ambulatory Visit (INDEPENDENT_AMBULATORY_CARE_PROVIDER_SITE_OTHER): Payer: Medicaid Other | Admitting: Family Medicine

## 2022-04-15 VITALS — BP 102/72 | HR 80 | Temp 97.1°F | Ht 64.0 in | Wt 192.0 lb

## 2022-04-15 DIAGNOSIS — G43011 Migraine without aura, intractable, with status migrainosus: Secondary | ICD-10-CM

## 2022-04-15 MED ORDER — KETOROLAC TROMETHAMINE 60 MG/2ML IM SOLN
60.0000 mg | Freq: Once | INTRAMUSCULAR | Status: AC
  Administered 2022-04-15: 60 mg via INTRAMUSCULAR

## 2022-04-15 MED ORDER — PROMETHAZINE HCL 25 MG/ML IJ SOLN
25.0000 mg | Freq: Once | INTRAMUSCULAR | Status: AC
  Administered 2022-04-15: 25 mg via INTRAMUSCULAR

## 2022-04-15 NOTE — Progress Notes (Signed)
Acute Office Visit  Subjective:    Patient ID: Betty Rogers, female    DOB: 25-Mar-1995, 27 y.o.   MRN: [id number]  Chief Complaint  Patient presents with   Migraine    X4 days with no relief.     Patient is in today for migraine that has been going on for 4 days without relief. Patient also states it is making her Nauseated and sensitive to light. Patient states that she has tried taking her nurtec and advil and it still has not eased at all. Patient has history of migraines and is currently on depakote, emgallity, and inderal LA for prevention. She denies missing any of her medicines.   Past Medical History:  Diagnosis Date   Agoraphobia 11/07/2016   Anxiety and depression    History of anxiety 03/29/2015   History of attention deficit hyperactivity disorder (ADHD) 03/29/2015   History of depression 03/29/2015   Migraines    Panic attacks    Psychosis ([place]) 07/18/2017    Past Surgical History:  Procedure Laterality Date   CHOLECYSTECTOMY     CHOLECYSTECTOMY     ESOPHAGOGASTRODUODENOSCOPY     REMOVAL BIRTH MARK ON HEAD   2008    Family History  Problem Relation Age of Onset   Heart attack Other    Cancer Other    Cancer Maternal Aunt        Pancreatic Stg II    Social History   Socioeconomic History   Marital status: Single    Spouse name: Not on file   Number of children: Not on file   Years of education: Not on file   Highest education level: High school graduate  Occupational History   Not on file  Tobacco Use   Smoking status: Never   Smokeless tobacco: Never  Vaping Use   Vaping Use: Never used  Substance and Sexual Activity   Alcohol use: Yes    Comment: "a half a wine cooler occasionally"   Drug use: Never   Sexual activity: Not Currently    Birth control/protection: Abstinence, Injection  Other Topics Concern   Not on file  Social History Narrative   Lives at home with her parents   Right handed   No caffeine   Social Determinants of  Radio broadcast assistant Strain: Not on file  Food Insecurity: Not on file  Transportation Needs: Not on file  Physical Activity: Not on file  Stress: Not on file  Social Connections: Not on file  Intimate Partner Violence: Not on file    Outpatient Medications Prior to Visit  Medication Sig Dispense Refill   ARIPiprazole (ABILIFY) 5 MG tablet Take 5 mg by mouth at bedtime.     busPIRone (BUSPAR) 10 MG tablet Take 10 mg by mouth 2 (two) times daily.     cyclobenzaprine (FLEXERIL) 5 MG tablet Take 1 tablet (5 mg total) by mouth 3 (three) times daily as needed for muscle spasms. 90 tablet 0   dicyclomine (BENTYL) 20 MG tablet Take 10 mg by mouth 4 (four) times daily -  before meals and at bedtime.      divalproex (DEPAKOTE ER) 250 MG 24 hr tablet Take 250 mg by mouth at bedtime.     famotidine (PEPCID) 20 MG tablet Take 20 mg by mouth 2 (two) times daily.     FLUoxetine (PROZAC) 20 MG capsule Take 1 capsule (20 mg total) by mouth daily. 30 capsule 0   Galcanezumab-gnlm (EMGALITY) 120 MG/ML SOAJ  INJECT 120MG INTO THE SKIN EVERY 30 DAYS 11 mL 3   hydrOXYzine (VISTARIL) 25 MG capsule Take 25 mg by mouth at bedtime.     LORazepam (ATIVAN) 1 MG tablet Take 1 mg by mouth 2 (two) times daily.     medroxyPROGESTERone (DEPO-PROVERA) 150 MG/ML injection Inject 150 mg into the muscle every 3 (three) months.     metFORMIN (GLUCOPHAGE) 500 MG tablet Take 1 tablet (500 mg total) by mouth daily with breakfast. 90 tablet 0   omeprazole (PRILOSEC) 20 MG capsule Take 20 mg by mouth 2 (two) times daily.     ondansetron (ZOFRAN-ODT) 4 MG disintegrating tablet Take 1-2 tablets (4-8 mg total) by mouth every 8 (eight) hours as needed. 30 tablet 6   propranolol ER (INDERAL LA) 80 MG 24 hr capsule Take 1 capsule (80 mg total) by mouth at bedtime. 90 capsule 3   Rimegepant Sulfate (NURTEC) 75 MG TBDP Take 75 mg by mouth daily as needed. For migraines. Take as close to onset of migraine as possible. One daily  maximum. 16 tablet 11   No facility-administered medications prior to visit.    Allergies  Allergen Reactions   Amitriptyline     Worse headaches   Milk ([place]ow)    Latex Rash   Nickel Rash   Other Itching    Metal     Review of Systems  Constitutional:  Negative for appetite change, fatigue and fever.  HENT:  Negative for congestion, ear pain, sinus pressure and sore throat.   Respiratory:  Negative for cough, chest tightness, shortness of breath and wheezing.   Cardiovascular:  Negative for chest pain and palpitations.  Gastrointestinal:  Positive for nausea. Negative for abdominal pain, constipation, diarrhea and vomiting.  Genitourinary:  Negative for dysuria and hematuria.  Musculoskeletal:  Negative for arthralgias, back pain, joint swelling and myalgias.  Skin:  Negative for rash.  Neurological:  Negative for dizziness, weakness and headaches (Migraine x4 days).  Psychiatric/Behavioral:  Negative for dysphoric mood. The patient is not nervous/anxious.        Objective:    Physical Exam Vitals reviewed.  Constitutional:      Appearance: Normal appearance. She is normal weight.  HENT:     Nose:     Comments: No sinus tenderness Cardiovascular:     Rate and Rhythm: Normal rate and regular rhythm.     Heart sounds: Normal heart sounds.  Pulmonary:     Effort: Pulmonary effort is normal.     Breath sounds: Normal breath sounds.  Skin:    Findings: Rash present. Rash is macular (Right anterior wrist. 5 small 2 mm in diameter red macules.).  Neurological:     Mental Status: She is alert and oriented to person, place, and time.  Psychiatric:        Mood and Affect: Mood normal.        Behavior: Behavior normal.     BP 102/72 (BP Location: Left Arm, Patient Position: Sitting)   Pulse 80   Temp (!) 97.1 F (36.2 C) (Temporal)   Ht 5' 4"$  (1.626 m)   Wt 192 lb (87.1 kg)   LMP  (LMP Unknown)   SpO2 98%   BMI 32.96 kg/m  Wt Readings from Last 3 Encounters:   04/15/22 192 lb (87.1 kg)  03/28/22 196 lb (88.9 kg)  02/14/22 193 lb (87.5 kg)    Health Maintenance Due  Topic Date Due   HIV Screening  Never done   PAP-Cervical  Cytology Screening  Never done   PAP SMEAR-Modifier  Never done    There are no preventive care reminders to display for this patient.   Lab Results  Component Value Date   TSH 2.240 12/17/2020   Lab Results  Component Value Date   WBC 7.4 04/01/2022   HGB 16.1 (H) 04/01/2022   HCT 46.8 (H) 04/01/2022   MCV 85 04/01/2022   PLT 251 04/01/2022   Lab Results  Component Value Date   NA 141 04/01/2022   K 4.4 04/01/2022   CO2 18 (L) 04/01/2022   GLUCOSE 86 04/01/2022   BUN 10 04/01/2022   CREATININE 0.93 04/01/2022   BILITOT 1.8 (H) 04/01/2022   ALKPHOS 81 04/01/2022   AST 24 04/01/2022   ALT 54 (H) 04/01/2022   PROT 7.0 04/01/2022   ALBUMIN 4.7 04/01/2022   CALCIUM 10.1 04/01/2022   ANIONGAP 13 07/18/2017   EGFR 86 04/01/2022   Lab Results  Component Value Date   CHOL 181 02/14/2022   Lab Results  Component Value Date   HDL 37 (L) 02/14/2022   Lab Results  Component Value Date   LDLCALC 123 (H) 02/14/2022   Lab Results  Component Value Date   TRIG 116 02/14/2022   Lab Results  Component Value Date   CHOLHDL 4.9 (H) 02/14/2022   Lab Results  Component Value Date   HGBA1C 5.1 02/14/2022         Assessment & Plan:  Migraine without aura, with intractable migraine, so stated, with status migrainosus Assessment & Plan: Toradol and phenergan shots given.  Continue emgallity.  May repeat nurtec once daily if migraine recurs.   Orders: -     Ketorolac Tromethamine -     Promethazine HCl      Meds ordered this encounter  Medications   ketorolac (TORADOL) injection 60 mg   promethazine (PHENERGAN) injection 25 mg    I,Lauren M Auman,acting as a scribe for Rochel Brome, MD.,have documented all relevant documentation on the behalf of Rochel Brome, MD,as directed by  Rochel Brome,  MD while in the presence of Rochel Brome, MD.   I attest that I have reviewed this visit and agree with the plan scribed by my staff.   Rochel Brome, MD [facility] [phone]

## 2022-04-15 NOTE — Assessment & Plan Note (Signed)
Toradol and phenergan shots given.  Continue emgallity.  May repeat nurtec once daily if migraine recurs.

## 2022-04-15 NOTE — Patient Instructions (Signed)
Toradol and phenergan shots given.  Continue emgallity.  May repeat nurtec once daily if migraine recurs.

## 2022-04-15 NOTE — Telephone Encounter (Signed)
Juliann Pulse called for an appointment for [facility].  She has had a migrane for 72 hours.  Will work in at 10 per Dr. Tobie Poet.

## 2022-04-18 ENCOUNTER — Encounter: Payer: Self-pay | Admitting: Family Medicine

## 2022-04-21 ENCOUNTER — Telehealth: Payer: Self-pay | Admitting: Neurology

## 2022-04-21 NOTE — Telephone Encounter (Signed)
Pt's mother reports that a PA is needed for Galcanezumab-gnlm (EMGALITY) 120 MG/ML SOAJ

## 2022-04-21 NOTE — Telephone Encounter (Signed)
Emgality PA completed on [employer]. Confirmation [id number]

## 2022-04-23 NOTE — Telephone Encounter (Signed)
Status: APPROVED Prior Approval #: [id number] Begin Date: 04/21/2022 End Date: 04/21/2023

## 2022-05-21 ENCOUNTER — Ambulatory Visit: Payer: Medicaid Other | Admitting: Family Medicine

## 2022-05-27 ENCOUNTER — Ambulatory Visit (INDEPENDENT_AMBULATORY_CARE_PROVIDER_SITE_OTHER): Payer: Medicaid Other | Admitting: Family Medicine

## 2022-05-27 ENCOUNTER — Encounter: Payer: Self-pay | Admitting: Family Medicine

## 2022-05-27 VITALS — BP 100/64 | HR 81 | Temp 96.8°F | Resp 16 | Ht 64.0 in | Wt 191.0 lb

## 2022-05-27 DIAGNOSIS — E782 Mixed hyperlipidemia: Secondary | ICD-10-CM

## 2022-05-27 DIAGNOSIS — R7401 Elevation of levels of liver transaminase levels: Secondary | ICD-10-CM

## 2022-05-27 DIAGNOSIS — R1013 Epigastric pain: Secondary | ICD-10-CM

## 2022-05-27 DIAGNOSIS — E282 Polycystic ovarian syndrome: Secondary | ICD-10-CM

## 2022-05-27 DIAGNOSIS — R112 Nausea with vomiting, unspecified: Secondary | ICD-10-CM | POA: Diagnosis not present

## 2022-05-27 DIAGNOSIS — F840 Autistic disorder: Secondary | ICD-10-CM

## 2022-05-27 DIAGNOSIS — G43011 Migraine without aura, intractable, with status migrainosus: Secondary | ICD-10-CM

## 2022-05-27 DIAGNOSIS — R948 Abnormal results of function studies of other organs and systems: Secondary | ICD-10-CM | POA: Diagnosis not present

## 2022-05-27 DIAGNOSIS — F330 Major depressive disorder, recurrent, mild: Secondary | ICD-10-CM

## 2022-05-27 NOTE — Progress Notes (Signed)
Subjective:  Patient ID: Betty Rogers, female    DOB: 03/23/1995  Age: 27 y.o. MRN: [id number]  Chief Complaint  Patient presents with   Hyperlipidemia   Migraine    HPI   Depression/Autism Spectrum: Patient is taking Buspar 10 mg twice a day, Fluoxetine 10 mg THREE TIMES A DAY, Lorazepam 1 mg twice a day. Patient sees psychiatry, Londell Moh, NP.   Migraine: Taking Nurtec 75 mg daily PRN, Propranolol 80 mg daily at bedtime, Emgallity once monthly. Working great. On depakote 250 mg daily.   PCOS: Takes Medroxyprogesterone  injection every 3 months ago, Metformin 500 mg daily.  Patient is scheduled to get an implant next month and will be stopping the medroxyprogesterone.  GERD: Taking Omeprazole 20 mg twice a day and pepcid otc. She has been experiencing nausea and vomiting for the last three days.  She has an upcoming EGD and colonoscopy scheduled with GI April 18.   IBS: On bentyl 20 mg before meals and before bed. She does report some bright red blood in her stools.        05/27/2022   11:01 AM 02/14/2022    9:27 AM 11/07/2021    9:01 AM 07/02/2021    7:32 AM 03/29/2021    8:46 AM  Depression screen PHQ 2/9  Decreased Interest 3 0 0 0 0  Down, Depressed, Hopeless 3 0 0 0 0  PHQ - 2 Score 6 0 0 0 0  Altered sleeping 3 1  1    Tired, decreased energy 3 1  2    Change in appetite 3 1  1    Feeling bad or failure about yourself  0 0  0   Trouble concentrating 0 0  0   Moving slowly or fidgety/restless 2 0  1   Suicidal thoughts 0 0  0   PHQ-9 Score 17 3  5    Difficult doing work/chores Very difficult Not difficult at all            12/17/2020    7:45 AM 03/29/2021    8:46 AM  Fall Risk  Falls in the past year? 0 0  Was there an injury with Fall? 0 0  Fall Risk Category Calculator 0 0  Fall Risk Category (Retired) Low Low  (RETIRED) Patient Fall Risk Level Low fall risk Low fall risk  Patient at Risk for Falls Due to No Fall Risks No Fall Risks  Fall risk Follow up  Falls evaluation completed Falls evaluation completed      Review of Systems  Constitutional:  Positive for fatigue. Negative for chills and fever.  HENT:  Positive for ear pain (ears popping). Negative for congestion, rhinorrhea and sore throat.   Respiratory:  Negative for cough and shortness of breath.   Cardiovascular:  Negative for chest pain.  Gastrointestinal:  Positive for abdominal pain, blood in stool, nausea and vomiting. Negative for constipation and diarrhea.  Genitourinary:  Negative for dysuria and urgency.  Musculoskeletal:  Negative for back pain and myalgias.  Neurological:  Positive for tremors. Negative for dizziness, weakness, light-headedness and headaches.  Psychiatric/Behavioral:  Positive for dysphoric mood. The patient is not nervous/anxious.     Current Outpatient Medications on File Prior to Visit  Medication Sig Dispense Refill   ARIPiprazole (ABILIFY) 5 MG tablet Take 5 mg by mouth at bedtime.     busPIRone (BUSPAR) 10 MG tablet Take 10 mg by mouth 2 (two) times daily.     cyclobenzaprine (FLEXERIL) 5 MG  tablet Take 1 tablet (5 mg total) by mouth 3 (three) times daily as needed for muscle spasms. 90 tablet 0   dicyclomine (BENTYL) 20 MG tablet Take 10 mg by mouth 4 (four) times daily -  before meals and at bedtime.      divalproex (DEPAKOTE ER) 250 MG 24 hr tablet Take 250 mg by mouth at bedtime.     famotidine (PEPCID) 20 MG tablet Take 20 mg by mouth 2 (two) times daily.     FLUoxetine (PROZAC) 20 MG capsule Take 1 capsule (20 mg total) by mouth daily. 30 capsule 0   Galcanezumab-gnlm (EMGALITY) 120 MG/ML SOAJ INJECT 120MG  INTO THE SKIN EVERY 30 DAYS 11 mL 3   hydrOXYzine (VISTARIL) 25 MG capsule Take 25 mg by mouth at bedtime.     LORazepam (ATIVAN) 1 MG tablet Take 1 mg by mouth 2 (two) times daily.     metFORMIN (GLUCOPHAGE) 500 MG tablet Take 1 tablet (500 mg total) by mouth daily with breakfast. 90 tablet 0   omeprazole (PRILOSEC) 20 MG capsule Take  20 mg by mouth 2 (two) times daily.     ondansetron (ZOFRAN-ODT) 4 MG disintegrating tablet Take 1-2 tablets (4-8 mg total) by mouth every 8 (eight) hours as needed. 30 tablet 6   propranolol ER (INDERAL LA) 80 MG 24 hr capsule Take 1 capsule (80 mg total) by mouth at bedtime. 90 capsule 3   Rimegepant Sulfate (NURTEC) 75 MG TBDP Take 75 mg by mouth daily as needed. For migraines. Take as close to onset of migraine as possible. One daily maximum. 16 tablet 11   No current facility-administered medications on file prior to visit.   Past Medical History:  Diagnosis Date   Agoraphobia 11/07/2016   Anxiety and depression    History of anxiety 03/29/2015   History of attention deficit hyperactivity disorder (ADHD) 03/29/2015   History of depression 03/29/2015   Migraines    Panic attacks    Psychosis ([place]) 07/18/2017   Past Surgical History:  Procedure Laterality Date   CHOLECYSTECTOMY     CHOLECYSTECTOMY     ESOPHAGOGASTRODUODENOSCOPY     REMOVAL BIRTH MARK ON HEAD   2008    Family History  Problem Relation Age of Onset   Heart attack Other    Cancer Other    Cancer Maternal Aunt        Pancreatic Stg II   Social History   Socioeconomic History   Marital status: Single    Spouse name: Not on file   Number of children: Not on file   Years of education: Not on file   Highest education level: High school graduate  Occupational History   Not on file  Tobacco Use   Smoking status: Never   Smokeless tobacco: Never  Vaping Use   Vaping Use: Never used  Substance and Sexual Activity   Alcohol use: Yes    Comment: "a half a wine cooler occasionally"   Drug use: Never   Sexual activity: Not Currently    Birth control/protection: Abstinence, Injection  Other Topics Concern   Not on file  Social History Narrative   Lives at home with her parents   Right handed   No caffeine   Social Determinants of Radio broadcast assistant Strain: Not on file  Food Insecurity: Not on file   Transportation Needs: Not on file  Physical Activity: Not on file  Stress: Not on file  Social Connections: Not on file  Objective:  BP 100/64   Pulse 81   Temp (!) 96.8 F (36 C)   Resp 16   Ht 5\' 4"  (1.626 m)   Wt 191 lb (86.6 kg)   SpO2 100%   BMI 32.79 kg/m      05/27/2022   11:55 AM 05/27/2022   10:57 AM 04/15/2022   10:10 AM  BP/Weight  Systolic BP 123XX123 94 [id number]  Diastolic BP 64 64 72  Wt. (Lbs)  191 192  BMI  32.79 kg/m2 32.96 kg/m2    Physical Exam Vitals reviewed.  Constitutional:      Appearance: Normal appearance. She is normal weight.  Neck:     Vascular: No carotid bruit.  Cardiovascular:     Rate and Rhythm: Normal rate and regular rhythm.     Heart sounds: Normal heart sounds.  Pulmonary:     Effort: Pulmonary effort is normal. No respiratory distress.     Breath sounds: Normal breath sounds.  Abdominal:     General: Abdomen is flat. Bowel sounds are normal.     Palpations: Abdomen is soft.     Tenderness: There is abdominal tenderness (EPIGASTRIC).  Neurological:     Mental Status: She is alert and oriented to person, place, and time.  Psychiatric:        Mood and Affect: Mood normal.        Behavior: Behavior normal.     Diabetic Foot Exam - Simple   No data filed      Lab Results  Component Value Date   WBC 7.3 05/27/2022   HGB 16.1 (H) 05/27/2022   HCT 47.3 (H) 05/27/2022   PLT 268 05/27/2022   GLUCOSE 74 05/27/2022   CHOL 193 05/27/2022   TRIG 138 05/27/2022   HDL 38 (L) 05/27/2022   LDLCALC 130 (H) 05/27/2022   ALT 29 05/27/2022   AST 17 05/27/2022   NA 143 05/27/2022   K 4.9 05/27/2022   CL 105 05/27/2022   CREATININE 1.01 (H) 05/27/2022   BUN 9 05/27/2022   CO2 21 05/27/2022   TSH 2.240 12/17/2020   INR 1.0 05/27/2022   HGBA1C 5.1 02/14/2022      Assessment & Plan:    Migraine without aura, with intractable migraine, so stated, with status migrainosus Assessment & Plan: The current medical regimen is  effective;  continue present plan and medications.  Taking Nurtec 75 mg daily PRN, Propranolol 80 mg daily at bedtime, Emgallity once monthly.    Abnormal pancreas function test Assessment & Plan: Check labs.   Epigastric abdominal pain Assessment & Plan: Check labs   Nausea and vomiting, unspecified vomiting type Assessment & Plan: START ON VOQUEZNA ONCE DAILY.  STOP OMEPRAZOLE    Orders: -     CBC with Differential/Platelet -     Comprehensive metabolic panel -     Amylase -     Lipase  Depression, major, recurrent, mild (HCC) Assessment & Plan: Management per specialist.   Autism spectrum disorder Assessment & Plan: Continue to see psychiatry   Mixed hyperlipidemia Assessment & Plan: Well controlled.  No medicines.  Continue to work on eating a healthy diet and exercise.  Labs drawn today.    Orders: -     Lipid panel  PCOS (polycystic ovarian syndrome) Assessment & Plan: HOLD METFORMIN.   Elevated transaminase level Assessment & Plan: Check labs  Orders: -     CBC with Differential/Platelet -     Comprehensive metabolic panel -  Ferritin -     Protime-INR -     AFP tumor marker  Other orders -     Cardiovascular Risk Assessment     No orders of the defined types were placed in this encounter.   Orders Placed This Encounter  Procedures   CBC with Differential/Platelet   Comprehensive metabolic panel   Ferritin   Protime-INR   Amylase   Lipase   Lipid panel   AFP tumor marker   Cardiovascular Risk Assessment     Follow-up: Return in about 3 months (around 08/27/2022) for chronic fasting.  Total time spent on today's visit was greater than 30 minutes, including both face-to-face time and nonface-to-face time personally spent on review of chart (labs and imaging), discussing labs and goals, discussing further work-up, treatment options, referrals to specialist if needed, reviewing outside records of pertinent, answering patient's  questions, and coordinating care.   I,Marla I Leal-Borjas,acting as a scribe for Rochel Brome, MD.,have documented all relevant documentation on the behalf of Rochel Brome, MD,as directed by  Rochel Brome, MD while in the presence of Rochel Brome, MD.    An After Visit Summary was printed and given to the patient.  I attest that I have reviewed this visit and agree with the plan scribed by my staff.   Rochel Brome, MD [facility] [phone]

## 2022-05-27 NOTE — Patient Instructions (Addendum)
START ON VOQUEZNA ONCE DAILY.  STOP OMEPRAZOLE  HOLD METFORMIN.

## 2022-05-28 LAB — COMPREHENSIVE METABOLIC PANEL
ALT: 29 IU/L (ref 0–32)
AST: 17 IU/L (ref 0–40)
Albumin/Globulin Ratio: 2.1 (ref 1.2–2.2)
Albumin: 4.9 g/dL (ref 4.0–5.0)
Alkaline Phosphatase: 81 IU/L (ref 44–121)
BUN/Creatinine Ratio: 9 (ref 9–23)
BUN: 9 mg/dL (ref 6–20)
Bilirubin Total: 1.7 mg/dL — ABNORMAL HIGH (ref 0.0–1.2)
CO2: 21 mmol/L (ref 20–29)
Calcium: 10.2 mg/dL (ref 8.7–10.2)
Chloride: 105 mmol/L (ref 96–106)
Creatinine, Ser: 1.01 mg/dL — ABNORMAL HIGH (ref 0.57–1.00)
Globulin, Total: 2.3 g/dL (ref 1.5–4.5)
Glucose: 74 mg/dL (ref 70–99)
Potassium: 4.9 mmol/L (ref 3.5–5.2)
Sodium: 143 mmol/L (ref 134–144)
Total Protein: 7.2 g/dL (ref 6.0–8.5)
eGFR: 78 mL/min/{1.73_m2} (ref >59)

## 2022-05-28 LAB — CBC WITH DIFFERENTIAL/PLATELET
Basophils Absolute: 0 10*3/uL (ref 0.0–0.2)
Basos: 0 %
EOS (ABSOLUTE): 0.1 10*3/uL (ref 0.0–0.4)
Eos: 1 %
Hematocrit: 47.3 % — ABNORMAL HIGH (ref 34.0–46.6)
Hemoglobin: 16.1 g/dL — ABNORMAL HIGH (ref 11.1–15.9)
Immature Grans (Abs): 0 10*3/uL (ref 0.0–0.1)
Immature Granulocytes: 0 %
Lymphocytes Absolute: 2.6 10*3/uL (ref 0.7–3.1)
Lymphs: 36 %
MCH: 29.2 pg (ref 26.6–33.0)
MCHC: 34 g/dL (ref 31.5–35.7)
MCV: 86 fL (ref 79–97)
Monocytes Absolute: 0.6 10*3/uL (ref 0.1–0.9)
Monocytes: 8 %
Neutrophils Absolute: 4 10*3/uL (ref 1.4–7.0)
Neutrophils: 55 %
Platelets: 268 10*3/uL (ref 150–450)
RBC: 5.51 x10E6/uL — ABNORMAL HIGH (ref 3.77–5.28)
RDW: 13.1 % (ref 11.7–15.4)
WBC: 7.3 10*3/uL (ref 3.4–10.8)

## 2022-05-28 LAB — LIPID PANEL
Chol/HDL Ratio: 5.1 ratio — ABNORMAL HIGH (ref 0.0–4.4)
Cholesterol, Total: 193 mg/dL (ref 100–199)
HDL: 38 mg/dL — ABNORMAL LOW (ref >39)
LDL Chol Calc (NIH): 130 mg/dL — ABNORMAL HIGH (ref 0–99)
Triglycerides: 138 mg/dL (ref 0–149)
VLDL Cholesterol Cal: 25 mg/dL (ref 5–40)

## 2022-05-28 LAB — FERRITIN: Ferritin: 72 ng/mL (ref 15–150)

## 2022-05-28 LAB — PROTIME-INR
INR: 1 (ref 0.9–1.2)
Prothrombin Time: 10.7 s (ref 9.1–12.0)

## 2022-05-28 LAB — AMYLASE: Amylase: 152 U/L — ABNORMAL HIGH (ref 31–110)

## 2022-05-28 LAB — LIPASE: Lipase: 84 U/L — ABNORMAL HIGH (ref 14–72)

## 2022-05-28 LAB — AFP TUMOR MARKER: AFP, Serum, Tumor Marker: <1.8 ng/mL (ref 0.0–4.7)

## 2022-06-01 ENCOUNTER — Encounter: Payer: Self-pay | Admitting: Family Medicine

## 2022-06-01 DIAGNOSIS — R7401 Elevation of levels of liver transaminase levels: Secondary | ICD-10-CM | POA: Insufficient documentation

## 2022-06-01 DIAGNOSIS — R1013 Epigastric pain: Secondary | ICD-10-CM | POA: Insufficient documentation

## 2022-06-01 DIAGNOSIS — R948 Abnormal results of function studies of other organs and systems: Secondary | ICD-10-CM | POA: Insufficient documentation

## 2022-06-01 NOTE — Assessment & Plan Note (Signed)
HOLD METFORMIN.

## 2022-06-01 NOTE — Assessment & Plan Note (Signed)
Check labs 

## 2022-06-01 NOTE — Assessment & Plan Note (Signed)
Management per specialist. 

## 2022-06-01 NOTE — Assessment & Plan Note (Signed)
Well controlled.  No medicines.  Continue to work on eating a healthy diet and exercise.  Labs drawn today.   

## 2022-06-01 NOTE — Assessment & Plan Note (Signed)
START ON VOQUEZNA ONCE DAILY.  STOP OMEPRAZOLE

## 2022-06-01 NOTE — Assessment & Plan Note (Addendum)
The current medical regimen is effective;  continue present plan and medications.  Taking Nurtec 75 mg daily PRN, Propranolol 80 mg daily at bedtime, Emgallity once monthly.

## 2022-06-01 NOTE — Assessment & Plan Note (Signed)
Continue to see psychiatry 

## 2022-06-04 ENCOUNTER — Other Ambulatory Visit: Payer: Self-pay | Admitting: Family Medicine

## 2022-06-04 ENCOUNTER — Telehealth: Payer: Self-pay

## 2022-06-04 DIAGNOSIS — R1013 Epigastric pain: Secondary | ICD-10-CM

## 2022-06-04 MED ORDER — VOQUEZNA 20 MG PO TABS: 1.0000 | ORAL_TABLET | Freq: Every day | ORAL | 0 refills | Status: DC

## 2022-06-04 NOTE — Telephone Encounter (Signed)
Done. Dr. Marco Adelson  

## 2022-06-04 NOTE — Telephone Encounter (Signed)
Betty Rogers is working very well.  She is requesting that a prescription be sent to [facility].

## 2022-08-27 ENCOUNTER — Other Ambulatory Visit: Payer: Self-pay | Admitting: *Deleted

## 2022-08-27 DIAGNOSIS — G43009 Migraine without aura, not intractable, without status migrainosus: Secondary | ICD-10-CM

## 2022-08-27 DIAGNOSIS — M542 Cervicalgia: Secondary | ICD-10-CM

## 2022-08-27 DIAGNOSIS — M5481 Occipital neuralgia: Secondary | ICD-10-CM

## 2022-08-27 MED ORDER — NURTEC 75 MG PO TBDP: 75.0000 mg | ORAL_TABLET | Freq: Every day | ORAL | 0 refills | Status: DC | PRN

## 2022-09-17 ENCOUNTER — Other Ambulatory Visit: Payer: Self-pay

## 2022-09-17 MED ORDER — EMGALITY 120 MG/ML ~~LOC~~ SOAJ: SUBCUTANEOUS | 0 refills | Status: DC

## 2022-09-19 ENCOUNTER — Encounter: Payer: Self-pay | Admitting: Family Medicine

## 2022-09-19 ENCOUNTER — Ambulatory Visit (INDEPENDENT_AMBULATORY_CARE_PROVIDER_SITE_OTHER): Payer: MEDICAID | Admitting: Family Medicine

## 2022-09-19 VITALS — BP 98/70 | HR 84 | Temp 97.0°F | Ht 64.0 in | Wt 194.0 lb

## 2022-09-19 DIAGNOSIS — G43011 Migraine without aura, intractable, with status migrainosus: Secondary | ICD-10-CM

## 2022-09-19 DIAGNOSIS — F330 Major depressive disorder, recurrent, mild: Secondary | ICD-10-CM | POA: Diagnosis not present

## 2022-09-19 DIAGNOSIS — E282 Polycystic ovarian syndrome: Secondary | ICD-10-CM

## 2022-09-19 DIAGNOSIS — F840 Autistic disorder: Secondary | ICD-10-CM | POA: Diagnosis not present

## 2022-09-19 DIAGNOSIS — E782 Mixed hyperlipidemia: Secondary | ICD-10-CM | POA: Diagnosis not present

## 2022-09-19 NOTE — Assessment & Plan Note (Signed)
Continue to see psychiatry 

## 2022-09-19 NOTE — Assessment & Plan Note (Signed)
Well controlled.  No medicines.  Continue to work on eating a healthy diet and exercise.  Labs drawn today.   

## 2022-09-19 NOTE — Assessment & Plan Note (Signed)
The current medical regimen is effective;  continue present plan and medications.  Taking Nurtec 75 mg daily PRN, Propranolol 80 mg daily at bedtime, Emgallity once monthly.

## 2022-09-19 NOTE — Assessment & Plan Note (Signed)
Management per specialist. 

## 2022-09-19 NOTE — Progress Notes (Signed)
Subjective:  Patient ID: Betty Rogers, female    DOB: Aug 18, 1995  Age: 27 y.o. MRN: [id number]  Chief Complaint  Patient presents with  . Medical Management of Chronic Issues    HPI   Depression/Autism Spectrum: Patient is taking Buspar 10 mg twice a day, Fluoxetine 10 mg THREE TIMES A DAY, Lorazepam 1 mg twice a day. Patient sees psychiatry, Garnett Farm, NP.    Migraine: Taking Nurtec 75 mg daily PRN, Propranolol 80 mg daily at bedtime, Emgallity once monthly. Working great. On depakote 250 mg daily. Has not had a migraine in approx 2 months.   PCOS: implant   GERD: Taking Omeprazole 20 mg twice a day   IBS: On bentyl 20 mg before meals and before bed. She does report some bright red blood in her stools.         09/19/2022   10:23 AM 05/27/2022   11:01 AM 02/14/2022    9:27 AM 11/07/2021    9:01 AM 07/02/2021    7:32 AM  Depression screen PHQ 2/9  Decreased Interest 1 3 0 0 0  Down, Depressed, Hopeless 0 3 0 0 0  PHQ - 2 Score 1 6 0 0 0  Altered sleeping 1 3 1  1   Tired, decreased energy 1 3 1  2   Change in appetite 0 3 1  1   Feeling bad or failure about yourself  1 0 0  0  Trouble concentrating 0 0 0  0  Moving slowly or fidgety/restless  2 0  1  Suicidal thoughts 0 0 0  0  PHQ-9 Score 4 17 3  5   Difficult doing work/chores Somewhat difficult Very difficult Not difficult at all          09/19/2022   10:23 AM  Fall Risk   Falls in the past year? 0  Number falls in past yr: 0  Injury with Fall? 0  Risk for fall due to : No Fall Risks  Follow up Falls evaluation completed    Patient Care Team: Blane Ohara, MD as PCP - General (Family Medicine) Misenheimer, Marcial Pacas, MD as Consulting Physician (Unknown Physician Specialty)   Review of Systems  Constitutional:  Negative for chills, fatigue and fever.  HENT:  Negative for congestion, ear pain, rhinorrhea and sore throat.   Respiratory:  Negative for cough and shortness of breath.   Cardiovascular:  Negative  for chest pain.  Gastrointestinal:  Negative for abdominal pain, constipation, diarrhea, nausea and vomiting.  Genitourinary:  Negative for dysuria and urgency.  Musculoskeletal:  Negative for back pain and myalgias.  Neurological:  Negative for dizziness, weakness, light-headedness and headaches.  Psychiatric/Behavioral:  Negative for dysphoric mood. The patient is not nervous/anxious.     Current Outpatient Medications on File Prior to Visit  Medication Sig Dispense Refill  . ARIPiprazole (ABILIFY) 5 MG tablet Take 5 mg by mouth at bedtime.    . busPIRone (BUSPAR) 10 MG tablet Take 10 mg by mouth 2 (two) times daily.    . cyclobenzaprine (FLEXERIL) 5 MG tablet Take 1 tablet (5 mg total) by mouth 3 (three) times daily as needed for muscle spasms. 90 tablet 0  . dicyclomine (BENTYL) 20 MG tablet Take 10 mg by mouth 4 (four) times daily -  before meals and at bedtime.     . divalproex (DEPAKOTE ER) 250 MG 24 hr tablet Take 250 mg by mouth at bedtime.    Marland Kitchen FLUoxetine (PROZAC) 20 MG capsule Take 1 capsule (  20 mg total) by mouth daily. 30 capsule 0  . Galcanezumab-gnlm (EMGALITY) 120 MG/ML SOAJ INJECT 120MG  INTO THE SKIN EVERY 30 DAYS 11 mL 0  . hydrOXYzine (VISTARIL) 25 MG capsule Take 25 mg by mouth at bedtime.    Marland Kitchen LORazepam (ATIVAN) 1 MG tablet Take 1 mg by mouth 2 (two) times daily.    . metFORMIN (GLUCOPHAGE) 500 MG tablet Take 1 tablet (500 mg total) by mouth daily with breakfast. 90 tablet 0  . ondansetron (ZOFRAN-ODT) 4 MG disintegrating tablet Take 1-2 tablets (4-8 mg total) by mouth every 8 (eight) hours as needed. 30 tablet 6  . propranolol ER (INDERAL LA) 80 MG 24 hr capsule Take 1 capsule (80 mg total) by mouth at bedtime. 90 capsule 3  . Rimegepant Sulfate (NURTEC) 75 MG TBDP Take 1 tablet (75 mg total) by mouth daily as needed. For migraines. Take as close to onset of migraine as possible. One daily maximum. Must be seen for further refills. 16 tablet 0   No current  facility-administered medications on file prior to visit.   Past Medical History:  Diagnosis Date  . Agoraphobia 11/07/2016  . Anxiety and depression   . History of anxiety 03/29/2015  . History of attention deficit hyperactivity disorder (ADHD) 03/29/2015  . History of depression 03/29/2015  . Migraines   . Panic attacks   . Psychosis (HCC) 07/18/2017   Past Surgical History:  Procedure Laterality Date  . CHOLECYSTECTOMY    . CHOLECYSTECTOMY    . ESOPHAGOGASTRODUODENOSCOPY    . REMOVAL BIRTH MARK ON HEAD   2008    Family History  Problem Relation Age of Onset  . Heart attack Other   . Cancer Other   . Cancer Maternal Aunt        Pancreatic Stg II   Social History   Socioeconomic History  . Marital status: Single    Spouse name: Not on file  . Number of children: Not on file  . Years of education: Not on file  . Highest education level: High school graduate  Occupational History  . Not on file  Tobacco Use  . Smoking status: Never  . Smokeless tobacco: Never  Vaping Use  . Vaping status: Never Used  Substance and Sexual Activity  . Alcohol use: Yes    Comment: "a half a wine cooler occasionally"  . Drug use: Never  . Sexual activity: Not Currently    Birth control/protection: Abstinence, Injection  Other Topics Concern  . Not on file  Social History Narrative   Lives at home with her parents   Right handed   No caffeine   Social Determinants of Health   Financial Resource Strain: Low Risk  (09/19/2022)   Overall Financial Resource Strain (CARDIA)   . Difficulty of Paying Living Expenses: Not hard at all  Food Insecurity: No Food Insecurity (09/19/2022)   Hunger Vital Sign   . Worried About Programme researcher, broadcasting/film/video in the Last Year: Never true   . Ran Out of Food in the Last Year: Never true  Transportation Needs: No Transportation Needs (09/19/2022)   PRAPARE - Transportation   . Lack of Transportation (Medical): No   . Lack of Transportation (Non-Medical): No   Physical Activity: Inactive (09/19/2022)   Exercise Vital Sign   . Days of Exercise per Week: 0 days   . Minutes of Exercise per Session: 0 min  Stress: No Stress Concern Present (09/19/2022)   [employer] of Occupational Health -  Occupational Stress Questionnaire   . Feeling of Stress : Not at all  Social Connections: Moderately Isolated (09/19/2022)   Social Connection and Isolation Panel [NHANES]   . Frequency of Communication with Friends and Family: Three times a week   . Frequency of Social Gatherings with Friends and Family: Three times a week   . Attends Religious Services: 1 to 4 times per year   . Active Member of Clubs or Organizations: No   . Attends Banker Meetings: Never   . Marital Status: Never married    Objective:  BP 98/70   Pulse 84   Temp (!) 97 F (36.1 C)   Ht 5\' 4"  (1.626 m)   Wt 194 lb (88 kg)   SpO2 98%   BMI 33.30 kg/m      09/24/2022    9:23 AM 09/24/2022    8:59 AM 09/19/2022   10:23 AM  BP/Weight  Systolic BP 90 84 98  Diastolic BP 70 70 70  Wt. (Lbs)  194 194  BMI  33.3 kg/m2 33.3 kg/m2    Physical Exam Vitals reviewed.  Constitutional:      Appearance: Normal appearance. She is obese.  Neck:     Vascular: No carotid bruit.  Cardiovascular:     Rate and Rhythm: Normal rate and regular rhythm.     Heart sounds: Normal heart sounds.  Pulmonary:     Effort: Pulmonary effort is normal. No respiratory distress.     Breath sounds: Normal breath sounds.  Abdominal:     General: Abdomen is flat. Bowel sounds are normal.     Palpations: Abdomen is soft.     Tenderness: There is no abdominal tenderness.  Neurological:     Mental Status: She is alert and oriented to person, place, and time.  Psychiatric:        Mood and Affect: Mood normal.        Behavior: Behavior normal.    Diabetic Foot Exam - Simple   No data filed      Lab Results  Component Value Date   WBC 6.9 09/19/2022   HGB 15.2 09/19/2022   HCT  44.7 09/19/2022   PLT 236 09/19/2022   GLUCOSE 83 09/19/2022   CHOL 160 09/19/2022   TRIG 130 09/19/2022   HDL 34 (L) 09/19/2022   LDLCALC 103 (H) 09/19/2022   ALT 22 09/19/2022   AST 21 09/19/2022   NA 142 09/19/2022   K 4.2 09/19/2022   CL 107 (H) 09/19/2022   CREATININE 0.98 09/19/2022   BUN 6 09/19/2022   CO2 18 (L) 09/19/2022   TSH 0.799 09/19/2022   INR 1.0 05/27/2022   HGBA1C 5.1 02/14/2022      Assessment & Plan:    Migraine without aura, with intractable migraine, so stated, with status migrainosus Assessment & Plan: The current medical regimen is effective;  continue present plan and medications.  Taking Nurtec 75 mg daily PRN, Propranolol 80 mg daily at bedtime, Emgallity once monthly.    Depression, major, recurrent, mild (HCC) Assessment & Plan: Management per specialist.   Autism spectrum disorder Assessment & Plan: Continue to see psychiatry   Mixed hyperlipidemia Assessment & Plan: Well controlled.  No medicines.  Continue to work on eating a healthy diet and exercise.  Labs drawn today.    Orders: -     CBC with Differential/Platelet -     Comprehensive metabolic panel -     Lipid panel -  TSH  PCOS (polycystic ovarian syndrome) Assessment & Plan: Implant      No orders of the defined types were placed in this encounter.   Orders Placed This Encounter  Procedures  . CBC with Differential/Platelet  . Comprehensive metabolic panel  . Lipid panel  . TSH     Follow-up: Return in about 3 months (around 12/20/2022) for chronic, fasting.   I,Katherina A Bramblett,acting as a scribe for Blane Ohara, MD.,have documented all relevant documentation on the behalf of Blane Ohara, MD,as directed by  Blane Ohara, MD while in the presence of Blane Ohara, MD.   An After Visit Summary was printed and given to the patient.  Blane Ohara, MD [facility] [phone]

## 2022-09-20 LAB — LIPID PANEL
Chol/HDL Ratio: 4.7 ratio — ABNORMAL HIGH (ref 0.0–4.4)
Cholesterol, Total: 160 mg/dL (ref 100–199)
HDL: 34 mg/dL — ABNORMAL LOW (ref >39)
LDL Chol Calc (NIH): 103 mg/dL — ABNORMAL HIGH (ref 0–99)
Triglycerides: 130 mg/dL (ref 0–149)
VLDL Cholesterol Cal: 23 mg/dL (ref 5–40)

## 2022-09-20 LAB — COMPREHENSIVE METABOLIC PANEL
ALT: 22 IU/L (ref 0–32)
AST: 21 IU/L (ref 0–40)
Albumin: 4.5 g/dL (ref 4.0–5.0)
Alkaline Phosphatase: 78 IU/L (ref 44–121)
BUN/Creatinine Ratio: 6 — ABNORMAL LOW (ref 9–23)
BUN: 6 mg/dL (ref 6–20)
Bilirubin Total: 2.1 mg/dL — ABNORMAL HIGH (ref 0.0–1.2)
CO2: 18 mmol/L — ABNORMAL LOW (ref 20–29)
Calcium: 9.9 mg/dL (ref 8.7–10.2)
Chloride: 107 mmol/L — ABNORMAL HIGH (ref 96–106)
Creatinine, Ser: 0.98 mg/dL (ref 0.57–1.00)
Globulin, Total: 2.1 g/dL (ref 1.5–4.5)
Glucose: 83 mg/dL (ref 70–99)
Potassium: 4.2 mmol/L (ref 3.5–5.2)
Sodium: 142 mmol/L (ref 134–144)
Total Protein: 6.6 g/dL (ref 6.0–8.5)
eGFR: 81 mL/min/{1.73_m2} (ref >59)

## 2022-09-20 LAB — CBC WITH DIFFERENTIAL/PLATELET
Basophils Absolute: 0 10*3/uL (ref 0.0–0.2)
Basos: 0 %
EOS (ABSOLUTE): 0.1 10*3/uL (ref 0.0–0.4)
Eos: 1 %
Hematocrit: 44.7 % (ref 34.0–46.6)
Hemoglobin: 15.2 g/dL (ref 11.1–15.9)
Immature Grans (Abs): 0 10*3/uL (ref 0.0–0.1)
Immature Granulocytes: 0 %
Lymphocytes Absolute: 3 10*3/uL (ref 0.7–3.1)
Lymphs: 43 %
MCH: 29.1 pg (ref 26.6–33.0)
MCHC: 34 g/dL (ref 31.5–35.7)
MCV: 86 fL (ref 79–97)
Monocytes Absolute: 0.6 10*3/uL (ref 0.1–0.9)
Monocytes: 9 %
Neutrophils Absolute: 3.2 10*3/uL (ref 1.4–7.0)
Neutrophils: 47 %
Platelets: 236 10*3/uL (ref 150–450)
RBC: 5.23 x10E6/uL (ref 3.77–5.28)
RDW: 13.1 % (ref 11.7–15.4)
WBC: 6.9 10*3/uL (ref 3.4–10.8)

## 2022-09-20 LAB — TSH: TSH: 0.799 u[IU]/mL (ref 0.450–4.500)

## 2022-09-20 NOTE — Assessment & Plan Note (Signed)
Implant

## 2022-09-23 ENCOUNTER — Other Ambulatory Visit: Payer: Self-pay | Admitting: Family Medicine

## 2022-09-23 ENCOUNTER — Telehealth: Payer: Self-pay | Admitting: Family Medicine

## 2022-09-23 ENCOUNTER — Telehealth: Payer: Self-pay

## 2022-09-23 NOTE — Telephone Encounter (Signed)
The patients mother Olegario Messier called this morning stating that Maika has had abdominal pain and nausea for 5 days. She has a hx of abnormal liver function. Olegario Messier believe the pain is located in the LUQ. Issa is currently at a day camp and someone is going to pick her up. Jetaun has been taking anti nausea medication that has helped some. I spoke with Kennon Rounds about this patient. I have made the patient an appointment for tomorrow morning with Huston Foley, [place]. Olegario Messier was notified to see how Emmaleigh is feeling, if she needs to be seen today the patient will need to go to [facility] or if her pain is severe she will need to go to the emergency department. Olegario Messier stated if something is different and has to take her somewhere she will call to cancel the appointment.

## 2022-09-23 NOTE — Telephone Encounter (Signed)
   Betty Rogers has been scheduled for the following appointment:  WHAT: GB/RUQ ULTRASOUND WHERE: [facility] DATE: 09/26/22 TIME: 9:00 AM CHECK-IN  A message has been left for the patient.

## 2022-09-24 ENCOUNTER — Ambulatory Visit (INDEPENDENT_AMBULATORY_CARE_PROVIDER_SITE_OTHER): Payer: MEDICAID | Admitting: Physician Assistant

## 2022-09-24 ENCOUNTER — Encounter: Payer: Self-pay | Admitting: Physician Assistant

## 2022-09-24 ENCOUNTER — Encounter: Payer: Self-pay | Admitting: Family Medicine

## 2022-09-24 VITALS — BP 90/70 | HR 86 | Temp 99.1°F | Resp 14 | Ht 64.0 in | Wt 194.0 lb

## 2022-09-24 DIAGNOSIS — R112 Nausea with vomiting, unspecified: Secondary | ICD-10-CM | POA: Diagnosis not present

## 2022-09-24 DIAGNOSIS — R948 Abnormal results of function studies of other organs and systems: Secondary | ICD-10-CM

## 2022-09-24 MED ORDER — PROMETHAZINE HCL 25 MG/ML IJ SOLN
25.0000 mg | Freq: Once | INTRAMUSCULAR | Status: AC
Start: 2022-09-24 — End: 2022-09-24
  Administered 2022-09-24: 25 mg via INTRAMUSCULAR

## 2022-09-24 MED ORDER — PROMETHAZINE HCL 25 MG PO TABS
25.0000 mg | ORAL_TABLET | Freq: Once | ORAL | Status: DC
Start: 2022-09-24 — End: 2022-09-24

## 2022-09-24 NOTE — Assessment & Plan Note (Signed)
Phenergan 25mg  IM given today U/S of liver scheduled for Friday to looking for residual stone after cholecystectomy Appointment with GI next week .

## 2022-09-24 NOTE — Assessment & Plan Note (Addendum)
Patient had orders for Amylase and Lipase from her GI doctor Labs drawn today History of Pancreatitis Will send results to her GI doctor.

## 2022-09-24 NOTE — Progress Notes (Signed)
Subjective:  Patient ID: Betty Rogers, female    DOB: 12/08/1995  Age: 27 y.o. MRN: [id number]  Chief Complaint  Patient presents with   Abdominal Pain   Nausea    HPI   Patient is here for abdominal pain on the lower quadrants since 5 days ago. She mentioned nauseas, vomiting x2 and constipation. She has history of pancreatitis 2 months ago and she saw Dr Jennye Boroughs. She will recheck lipase and amylase today in office. She denies fever, chills, sob, chest pain, headaches.      09/19/2022   10:23 AM 05/27/2022   11:01 AM 02/14/2022    9:27 AM 11/07/2021    9:01 AM 07/02/2021    7:32 AM  Depression screen PHQ 2/9  Decreased Interest 1 3 0 0 0  Down, Depressed, Hopeless 0 3 0 0 0  PHQ - 2 Score 1 6 0 0 0  Altered sleeping 1 3 1  1   Tired, decreased energy 1 3 1  2   Change in appetite 0 3 1  1   Feeling bad or failure about yourself  1 0 0  0  Trouble concentrating 0 0 0  0  Moving slowly or fidgety/restless  2 0  1  Suicidal thoughts 0 0 0  0  PHQ-9 Score 4 17 3  5   Difficult doing work/chores Somewhat difficult Very difficult Not difficult at all          09/19/2022   10:23 AM  Fall Risk   Falls in the past year? 0  Number falls in past yr: 0  Injury with Fall? 0  Risk for fall due to : No Fall Risks  Follow up Falls evaluation completed    Patient Care Team: Blane Ohara, MD as PCP - General (Family Medicine) Misenheimer, Marcial Pacas, MD as Consulting Physician (Unknown Physician Specialty)   Review of Systems  Constitutional:  Negative for chills, fatigue and fever.  HENT:  Negative for congestion, ear pain and sore throat.   Respiratory:  Negative for cough and shortness of breath.   Cardiovascular:  Negative for chest pain and palpitations.  Gastrointestinal:  Positive for abdominal pain, constipation, nausea and vomiting. Negative for diarrhea.  Endocrine: Negative for polydipsia, polyphagia and polyuria.  Genitourinary:  Negative for difficulty urinating and  dysuria.  Musculoskeletal:  Negative for arthralgias, back pain and myalgias.  Skin:  Negative for rash.  Neurological:  Negative for headaches.  Psychiatric/Behavioral:  Negative for dysphoric mood. The patient is not nervous/anxious.     Current Outpatient Medications on File Prior to Visit  Medication Sig Dispense Refill   ARIPiprazole (ABILIFY) 5 MG tablet Take 5 mg by mouth at bedtime.     busPIRone (BUSPAR) 10 MG tablet Take 10 mg by mouth 2 (two) times daily.     cyclobenzaprine (FLEXERIL) 5 MG tablet Take 1 tablet (5 mg total) by mouth 3 (three) times daily as needed for muscle spasms. 90 tablet 0   dicyclomine (BENTYL) 20 MG tablet Take 10 mg by mouth 4 (four) times daily -  before meals and at bedtime.      divalproex (DEPAKOTE ER) 250 MG 24 hr tablet Take 250 mg by mouth at bedtime.     famotidine (PEPCID) 20 MG tablet Take 20 mg by mouth 2 (two) times daily.     FLUoxetine (PROZAC) 20 MG capsule Take 1 capsule (20 mg total) by mouth daily. 30 capsule 0   Galcanezumab-gnlm (EMGALITY) 120 MG/ML SOAJ INJECT 120MG  INTO THE  SKIN EVERY 30 DAYS 11 mL 0   hydrOXYzine (VISTARIL) 25 MG capsule Take 25 mg by mouth at bedtime.     LORazepam (ATIVAN) 1 MG tablet Take 1 mg by mouth 2 (two) times daily.     metFORMIN (GLUCOPHAGE) 500 MG tablet Take 1 tablet (500 mg total) by mouth daily with breakfast. 90 tablet 0   ondansetron (ZOFRAN-ODT) 4 MG disintegrating tablet Take 1-2 tablets (4-8 mg total) by mouth every 8 (eight) hours as needed. 30 tablet 6   pantoprazole (PROTONIX) 40 MG tablet Take 40 mg by mouth every morning.     propranolol ER (INDERAL LA) 80 MG 24 hr capsule Take 1 capsule (80 mg total) by mouth at bedtime. 90 capsule 3   Rimegepant Sulfate (NURTEC) 75 MG TBDP Take 1 tablet (75 mg total) by mouth daily as needed. For migraines. Take as close to onset of migraine as possible. One daily maximum. Must be seen for further refills. 16 tablet 0   No current facility-administered  medications on file prior to visit.   Past Medical History:  Diagnosis Date   Agoraphobia 11/07/2016   Anxiety and depression    History of anxiety 03/29/2015   History of attention deficit hyperactivity disorder (ADHD) 03/29/2015   History of depression 03/29/2015   Migraines    Panic attacks    Psychosis (HCC) 07/18/2017   Past Surgical History:  Procedure Laterality Date   CHOLECYSTECTOMY     CHOLECYSTECTOMY     ESOPHAGOGASTRODUODENOSCOPY     REMOVAL BIRTH MARK ON HEAD   2008    Family History  Problem Relation Age of Onset   Heart attack Other    Cancer Other    Cancer Maternal Aunt        Pancreatic Stg II   Social History   Socioeconomic History   Marital status: Single    Spouse name: Not on file   Number of children: Not on file   Years of education: Not on file   Highest education level: High school graduate  Occupational History   Not on file  Tobacco Use   Smoking status: Never   Smokeless tobacco: Never  Vaping Use   Vaping status: Never Used  Substance and Sexual Activity   Alcohol use: Yes    Comment: "a half a wine cooler occasionally"   Drug use: Never   Sexual activity: Not Currently    Birth control/protection: Abstinence, Injection  Other Topics Concern   Not on file  Social History Narrative   Lives at home with her parents   Right handed   No caffeine   Social Determinants of Health   Financial Resource Strain: Low Risk  (09/19/2022)   Overall Financial Resource Strain (CARDIA)    Difficulty of Paying Living Expenses: Not hard at all  Food Insecurity: No Food Insecurity (09/19/2022)   Hunger Vital Sign    Worried About Running Out of Food in the Last Year: Never true    Ran Out of Food in the Last Year: Never true  Transportation Needs: No Transportation Needs (09/19/2022)   PRAPARE - Administrator, Civil Service (Medical): No    Lack of Transportation (Non-Medical): No  Physical Activity: Inactive (09/19/2022)   Exercise  Vital Sign    Days of Exercise per Week: 0 days    Minutes of Exercise per Session: 0 min  Stress: No Stress Concern Present (09/19/2022)   [employer] of Occupational Health - Occupational Stress Questionnaire  Feeling of Stress : Not at all  Social Connections: Moderately Isolated (09/19/2022)   Social Connection and Isolation Panel [NHANES]    Frequency of Communication with Friends and Family: Three times a week    Frequency of Social Gatherings with Friends and Family: Three times a week    Attends Religious Services: 1 to 4 times per year    Active Member of Clubs or Organizations: No    Attends Banker Meetings: Never    Marital Status: Never married    Objective:  BP 90/70   Pulse 86   Temp 99.1 F (37.3 C)   Resp 14   Ht 5\' 4"  (1.626 m)   Wt 194 lb (88 kg)   SpO2 99%   BMI 33.30 kg/m      09/24/2022    9:23 AM 09/24/2022    8:59 AM 09/19/2022   10:23 AM  BP/Weight  Systolic BP 90 84 98  Diastolic BP 70 70 70  Wt. (Lbs)  194 194  BMI  33.3 kg/m2 33.3 kg/m2    Physical Exam Vitals reviewed.  Constitutional:      Appearance: Normal appearance.  Neck:     Vascular: No carotid bruit.  Cardiovascular:     Rate and Rhythm: Normal rate and regular rhythm.     Heart sounds: Normal heart sounds.  Pulmonary:     Effort: Pulmonary effort is normal.     Breath sounds: Normal breath sounds.  Abdominal:     General: Bowel sounds are normal.     Palpations: Abdomen is soft.     Tenderness: There is no abdominal tenderness. There is no guarding or rebound. Negative signs include Rovsing's sign.     Hernia: No hernia is present.  Neurological:     Mental Status: She is alert and oriented to person, place, and time.  Psychiatric:        Mood and Affect: Mood normal.        Behavior: Behavior normal.     Diabetic Foot Exam - Simple   No data filed      Lab Results  Component Value Date   WBC 6.9 09/19/2022   HGB 15.2 09/19/2022   HCT 44.7  09/19/2022   PLT 236 09/19/2022   GLUCOSE 83 09/19/2022   CHOL 160 09/19/2022   TRIG 130 09/19/2022   HDL 34 (L) 09/19/2022   LDLCALC 103 (H) 09/19/2022   ALT 22 09/19/2022   AST 21 09/19/2022   NA 142 09/19/2022   K 4.2 09/19/2022   CL 107 (H) 09/19/2022   CREATININE 0.98 09/19/2022   BUN 6 09/19/2022   CO2 18 (L) 09/19/2022   TSH 0.799 09/19/2022   INR 1.0 05/27/2022   HGBA1C 5.1 02/14/2022      Assessment & Plan:    Nausea and vomiting, unspecified vomiting type Assessment & Plan: Phenergan 25mg  IM given today U/S of liver scheduled for Friday to looking for residual stone after cholecystectomy Appointment with GI next week .   Orders: -     Promethazine HCl  Abnormal pancreas function test Assessment & Plan: Patient had orders for Amylase and Lipase from her GI doctor Labs drawn today History of Pancreatitis Will send results to her GI doctor.   Orders: -     Lipase -     Amylase     Meds ordered this encounter  Medications   DISCONTD: promethazine (PHENERGAN) tablet 25 mg   promethazine (PHENERGAN) injection 25 mg  Orders Placed This Encounter  Procedures   Lipase   Amylase     Follow-up: Return if symptoms worsen or fail to improve.   I,Marla I Leal-Borjas,acting as a scribe for [employer], PA.,have documented all relevant documentation on the behalf of Betty Gauss, PA,as directed by  Betty Gauss, PA while in the presence of Betty Rogers, [place].   An After Visit Summary was printed and given to the patient.  Betty Rogers, [place] [facility] [phone]

## 2022-09-25 ENCOUNTER — Other Ambulatory Visit: Payer: Self-pay

## 2022-09-25 LAB — LIPASE: Lipase: 126 U/L — ABNORMAL HIGH (ref 14–72)

## 2022-09-25 LAB — AMYLASE: Amylase: 181 U/L — ABNORMAL HIGH (ref 31–110)

## 2022-09-29 ENCOUNTER — Other Ambulatory Visit: Payer: Self-pay | Admitting: *Deleted

## 2022-09-29 ENCOUNTER — Telehealth: Payer: Self-pay

## 2022-09-29 DIAGNOSIS — M542 Cervicalgia: Secondary | ICD-10-CM

## 2022-09-29 DIAGNOSIS — G43009 Migraine without aura, not intractable, without status migrainosus: Secondary | ICD-10-CM

## 2022-09-29 DIAGNOSIS — M5481 Occipital neuralgia: Secondary | ICD-10-CM

## 2022-09-29 DIAGNOSIS — R7989 Other specified abnormal findings of blood chemistry: Secondary | ICD-10-CM

## 2022-09-29 MED ORDER — NURTEC 75 MG PO TBDP: 75.0000 mg | ORAL_TABLET | Freq: Every day | ORAL | 0 refills | Status: DC | PRN

## 2022-09-29 NOTE — Progress Notes (Unsigned)
No chief complaint on file.   HISTORY OF PRESENT ILLNESS:  09/29/22 ALL:  Betty Rogers is a 27 y.o. female here today for follow up for    12/24/20 ALL (Mychart): Betty Rogers returns for migraine follow up. She feels that she is doing fairly well. She may have 3-4 migraines per month. She continues Emgality and propranolol. Rizatriptan helps with abortive therapy but makes her sleepy. She may use 3-4 per month. Advil helps with milder headaches. She recently quit her job due to panic attacks and increased migraines. She feels that she is doing better now.   11/15/2019 ALL (Mychart):  Betty Rogers is a 27 y.o. female here today for follow up for migraines.  She continues Emgality monthly and propranolol every day at bedtime.  She reports that she is doing very well.  She may have 2 migraine days per month.  Rizatriptan helps to abort migraines.  She is feeling well and without concerns.  She continues an adult camp throughout the week.  Mother is primary caregiver.   REVIEW OF SYSTEMS: Out of a complete 14 system review of symptoms, the patient complains only of the following symptoms, and all other reviewed systems are negative.   ALLERGIES: Allergies  Allergen Reactions   Amitriptyline     Worse headaches   Milk (Cow)    Latex Rash   Nickel Rash   Other Itching    Metal      HOME MEDICATIONS: Outpatient Medications Prior to Visit  Medication Sig Dispense Refill   ARIPiprazole (ABILIFY) 5 MG tablet Take 5 mg by mouth at bedtime.     busPIRone (BUSPAR) 10 MG tablet Take 10 mg by mouth 2 (two) times daily.     cyclobenzaprine (FLEXERIL) 5 MG tablet Take 1 tablet (5 mg total) by mouth 3 (three) times daily as needed for muscle spasms. 90 tablet 0   dicyclomine (BENTYL) 20 MG tablet Take 10 mg by mouth 4 (four) times daily -  before meals and at bedtime.      divalproex (DEPAKOTE ER) 250 MG 24 hr tablet Take 250 mg by mouth at bedtime.     famotidine (PEPCID) 20 MG tablet  Take 20 mg by mouth 2 (two) times daily.     FLUoxetine (PROZAC) 20 MG capsule Take 1 capsule (20 mg total) by mouth daily. 30 capsule 0   Galcanezumab-gnlm (EMGALITY) 120 MG/ML SOAJ INJECT 120MG  INTO THE SKIN EVERY 30 DAYS 11 mL 0   hydrOXYzine (VISTARIL) 25 MG capsule Take 25 mg by mouth at bedtime.     LORazepam (ATIVAN) 1 MG tablet Take 1 mg by mouth 2 (two) times daily.     metFORMIN (GLUCOPHAGE) 500 MG tablet Take 1 tablet (500 mg total) by mouth daily with breakfast. 90 tablet 0   ondansetron (ZOFRAN-ODT) 4 MG disintegrating tablet Take 1-2 tablets (4-8 mg total) by mouth every 8 (eight) hours as needed. 30 tablet 6   pantoprazole (PROTONIX) 40 MG tablet Take 40 mg by mouth every morning.     propranolol ER (INDERAL LA) 80 MG 24 hr capsule Take 1 capsule (80 mg total) by mouth at bedtime. 90 capsule 3   Rimegepant Sulfate (NURTEC) 75 MG TBDP Take 1 tablet (75 mg total) by mouth daily as needed. For migraines. Take as close to onset of migraine as possible. One daily maximum. MUST BE SEEN FOR FURTHER REFILLS. CALL [phone]. 16 tablet 0   No facility-administered medications prior to visit.  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/18/2017     PAST SURGICAL HISTORY: Past Surgical History:  Procedure Laterality Date   CHOLECYSTECTOMY     CHOLECYSTECTOMY     ESOPHAGOGASTRODUODENOSCOPY     REMOVAL BIRTH MARK ON HEAD   2008     FAMILY HISTORY: Family History  Problem Relation Age of Onset   Heart attack Other    Cancer Other    Cancer Maternal Aunt        Pancreatic Stg II     SOCIAL HISTORY: Social History   Socioeconomic History   Marital status: Single    Spouse name: Not on file   Number of children:  Not on file   Years of education: Not on file   Highest education level: High school graduate  Occupational History   Not on file  Tobacco Use   Smoking status: Never   Smokeless tobacco: Never  Vaping Use   Vaping status: Never Used  Substance and Sexual Activity   Alcohol use: Yes    Comment: "a half a wine cooler occasionally"   Drug use: Never   Sexual activity: Not Currently    Birth control/protection: Abstinence, Injection  Other Topics Concern   Not on file  Social History Narrative   Lives at home with her parents   Right handed   No caffeine   Social Determinants of Health   Financial Resource Strain: Low Risk  (09/19/2022)   Overall Financial Resource Strain (CARDIA)    Difficulty of Paying Living Expenses: Not hard at all  Food Insecurity: No Food Insecurity (09/19/2022)   Hunger Vital Sign    Worried About Running Out of Food in the Last Year: Never true    Ran Out of Food in the Last Year: Never true  Transportation Needs: No Transportation Needs (09/19/2022)   PRAPARE - Administrator, Civil Service (Medical): No    Lack of Transportation (Non-Medical): No  Physical Activity: Inactive (09/19/2022)   Exercise Vital Sign    Days of Exercise per Week: 0 days    Minutes of Exercise per Session: 0 min  Stress: No Stress Concern Present (09/19/2022)   [employer] of Occupational Health - Occupational Stress Questionnaire    Feeling of Stress : Not at all  Social Connections: Moderately Isolated (09/19/2022)   Social Connection and Isolation Panel [NHANES]    Frequency of Communication with Friends and Family: Three times a week    Frequency of Social Gatherings with Friends and Family: Three times a week    Attends Religious Services: 1 to 4 times per year    Active Member of Clubs or Organizations: No    Attends Banker Meetings: Never    Marital Status: Never married  Intimate Partner Violence: Not At Risk (09/19/2022)    Humiliation, Afraid, Rape, and Kick questionnaire    Fear of Current or Ex-Partner: No    Emotionally Abused: No    Physically Abused: No    Sexually Abused: No     PHYSICAL EXAM  There were no vitals filed for this visit. There is no height or weight on file to calculate BMI.  Generalized: Well developed, in no acute distress  Cardiology: normal rate and rhythm, no murmur auscultated  Respiratory: clear to auscultation bilaterally    Neurological examination  Mentation: Alert oriented to time, place, history taking. Follows all commands speech and language fluent Cranial nerve II-XII: Pupils were equal round reactive to light. Extraocular movements were full, visual field were full on confrontational test. Facial sensation and strength were normal. Uvula tongue midline. Head turning and shoulder shrug  were normal and symmetric. Motor: The motor testing reveals 5 over 5 strength of all 4 extremities. Good symmetric motor tone is noted throughout.  Sensory: Sensory testing is intact to soft touch on all 4 extremities. No evidence of extinction is noted.  Coordination: Cerebellar testing reveals good finger-nose-finger and heel-to-shin bilaterally.  Gait and station: Gait is normal. Tandem gait is normal. Romberg is negative. No drift is seen.  Reflexes: Deep tendon reflexes are symmetric and normal bilaterally.    DIAGNOSTIC DATA (LABS, IMAGING, TESTING) - I reviewed patient records, labs, notes, testing and imaging myself where available.  Lab Results  Component Value Date   WBC 6.9 09/19/2022   HGB 15.2 09/19/2022   HCT 44.7 09/19/2022   MCV 86 09/19/2022   PLT 236 09/19/2022      Component Value Date/Time   NA 142 09/19/2022 1042   K 4.2 09/19/2022 1042   CL 107 (H) 09/19/2022 1042   CO2 18 (L) 09/19/2022 1042   GLUCOSE 83 09/19/2022 1042   GLUCOSE 99 07/18/2017 0535   BUN 6 09/19/2022 1042   CREATININE 0.98 09/19/2022 1042   CALCIUM 9.9 09/19/2022 1042   PROT 6.6  09/19/2022 1042   ALBUMIN 4.5 09/19/2022 1042   AST 21 09/19/2022 1042   ALT 22 09/19/2022 1042   ALKPHOS 78 09/19/2022 1042   BILITOT 2.1 (H) 09/19/2022 1042   GFRNONAA 86 03/26/2020 0833   GFRAA 99 03/26/2020 0833   Lab Results  Component Value Date   CHOL 160 09/19/2022   HDL 34 (L) 09/19/2022   LDLCALC 103 (H) 09/19/2022   TRIG 130 09/19/2022   CHOLHDL 4.7 (H) 09/19/2022   Lab Results  Component Value Date   HGBA1C 5.1 02/14/2022   No results found for: "VITAMINB12" Lab Results  Component Value Date   TSH 0.799 09/19/2022        No data to display               No data to display           ASSESSMENT AND PLAN  27 y.o. year old female  has a past medical history of Agoraphobia (11/07/2016), Anxiety and depression, History of anxiety (03/29/2015), History of attention deficit hyperactivity disorder (ADHD) (03/29/2015), History of depression (03/29/2015), Migraines, Panic attacks, and Psychosis (HCC) (07/18/2017). here with    No diagnosis found.  [facility] ***.  Healthy lifestyle habits encouraged. *** will follow up with PCP as directed. *** will return to see me in ***, sooner if needed. *** verbalizes understanding and agreement with this plan.   No orders of the defined types were placed in this encounter.    No orders of the defined types were placed in this encounter.    Shawnie Dapper, MSN, FNP-C 09/29/2022, 4:58 PM  [facility] [address], [address] [place], [place] [address] [phone]

## 2022-09-29 NOTE — Telephone Encounter (Signed)
Mother called back to inquire about ultrasound results. Mother was informed of these results and she states that she will bring the patient by one day this week to have it rechecked seeing she is scheduled to see Dr. Jennye Boroughs this Thursday August 1st 2024 to discuss everything currently going on. Order put in for Bilirubin recheck.

## 2022-09-30 ENCOUNTER — Encounter: Payer: Self-pay | Admitting: Family Medicine

## 2022-09-30 ENCOUNTER — Ambulatory Visit: Payer: Medicaid Other | Admitting: Family Medicine

## 2022-09-30 ENCOUNTER — Ambulatory Visit: Payer: MEDICAID

## 2022-09-30 VITALS — BP 99/66 | HR 86 | Ht 64.0 in | Wt 192.6 lb

## 2022-09-30 DIAGNOSIS — G43711 Chronic migraine without aura, intractable, with status migrainosus: Secondary | ICD-10-CM

## 2022-09-30 DIAGNOSIS — G43009 Migraine without aura, not intractable, without status migrainosus: Secondary | ICD-10-CM

## 2022-09-30 DIAGNOSIS — R7989 Other specified abnormal findings of blood chemistry: Secondary | ICD-10-CM

## 2022-09-30 DIAGNOSIS — R251 Tremor, unspecified: Secondary | ICD-10-CM | POA: Diagnosis not present

## 2022-09-30 MED ORDER — EMGALITY 120 MG/ML ~~LOC~~ SOAJ
SUBCUTANEOUS | 3 refills | Status: DC
Start: 2022-09-30 — End: 2023-10-15

## 2022-09-30 MED ORDER — PROPRANOLOL HCL ER 80 MG PO CP24
80.0000 mg | ORAL_CAPSULE | Freq: Every day | ORAL | 3 refills | Status: DC
Start: 2022-09-30 — End: 2023-10-05

## 2022-09-30 MED ORDER — NURTEC 75 MG PO TBDP
75.0000 mg | ORAL_TABLET | Freq: Every day | ORAL | 11 refills | Status: DC | PRN
Start: 2022-09-30 — End: 2023-09-23

## 2022-09-30 NOTE — Patient Instructions (Signed)
Below is our plan:  We will continue propranolol 80mg  daily, Emgality monthly and Nurtec as needed. Continue to monitor tremor.   Please make sure you are staying well hydrated. I recommend 50-60 ounces daily. Well balanced diet and regular exercise encouraged. Consistent sleep schedule with 6-8 hours recommended.   Please continue follow up with care team as directed.   Follow up with me in 1 year   You may receive a survey regarding today's visit. I encourage you to leave honest feed back as I do use this information to improve patient care. Thank you for seeing me today!   GENERAL HEADACHE INFORMATION:   Natural supplements: Magnesium Oxide or Magnesium Glycinate 500 mg at bed (up to 800 mg daily) Coenzyme Q10 300 mg in AM Vitamin B2- 200 mg twice a day   Add 1 supplement at a time since even natural supplements can have undesirable side effects. You can sometimes buy supplements cheaper (especially Coenzyme Q10) at www.WebmailGuide.co.za or at [facility].  Migraine with aura: There is increased risk for stroke in women with migraine with aura and a contraindication for the combined contraceptive pill for use by women who have migraine with aura. The risk for women with migraine without aura is lower. However other risk factors like smoking are far more likely to increase stroke risk than migraine. There is a recommendation for no smoking and for the use of OCPs without estrogen such as progestogen only pills particularly for women with migraine with aura.Marland Kitchen People who have migraine headaches with auras may be 3 times more likely to have a stroke caused by a blood clot, compared to migraine patients who don't see auras. Women who take hormone-replacement therapy may be 30 percent more likely to suffer a clot-based stroke than women not taking medication containing estrogen. Other risk factors like smoking and high blood pressure may be  much more important.    Vitamins and herbs that show potential:    Magnesium: Magnesium (250 mg twice a day or 500 mg at bed) has a relaxant effect on smooth muscles such as blood vessels. Individuals suffering from frequent or daily headache usually have low magnesium levels which can be increase with daily supplementation of 400-750 mg. Three trials found 40-90% average headache reduction  when used as a preventative. Magnesium may help with headaches are aura, the best evidence for magnesium is for migraine with aura is its thought to stop the cortical spreading depression we believe is the pathophysiology of migraine aura.Magnesium also demonstrated the benefit in menstrually related migraine.  Magnesium is part of the messenger system in the serotonin cascade and it is a good muscle relaxant.  It is also useful for constipation which can be a side effect of other medications used to treat migraine. Good sources include nuts, whole grains, and tomatoes. Side Effects: loose stool/diarrhea  Riboflavin (vitamin B 2) 200 mg twice a day. This vitamin assists nerve cells in the production of ATP a principal energy storing molecule.  It is necessary for many chemical reactions in the body.  There have been at least 3 clinical trials of riboflavin using 400 mg per day all of which suggested that migraine frequency can be decreased.  All 3 trials showed significant improvement in over half of migraine sufferers.  The supplement is found in bread, cereal, milk, meat, and poultry.  Most Americans get more riboflavin than the recommended daily allowance, however riboflavin deficiency is not necessary for the supplements to help prevent headache. Side  effects: energizing, green urine   Coenzyme Q10: This is present in almost all cells in the body and is critical component for the conversion of energy.  Recent studies have shown that a nutritional supplement of CoQ10 can reduce the frequency of migraine attacks by improving the energy production of cells as with riboflavin.  Doses of  150 mg twice a day have been shown to be effective.   Melatonin: Increasing evidence shows correlation between melatonin secretion and headache conditions.  Melatonin supplementation has decreased headache intensity and duration.  It is widely used as a sleep aid.  Sleep is natures way of dealing with migraine.  A dose of 3 mg is recommended to start for headaches including cluster headache. Higher doses up to 15 mg has been reviewed for use in Cluster headache and have been used. The rationale behind using melatonin for cluster is that many theories regarding the cause of Cluster headache center around the disruption of the normal circadian rhythm in the brain.  This helps restore the normal circadian rhythm.   HEADACHE DIET: Foods and beverages which may trigger migraine Note that only 20% of headache patients are food sensitive. You will know if you are food sensitive if you get a headache consistently 20 minutes to 2 hours after eating a certain food. Only cut out a food if it causes headaches, otherwise you might remove foods you enjoy! What matters most for diet is to eat a well balanced healthy diet full of vegetables and low fat protein, and to not miss meals.   Chocolate, other sweets ALL cheeses except cottage and cream cheese Dairy products, yogurt, sour cream, ice cream Liver Meat extracts (Bovril, Marmite, meat tenderizers) Meats or fish which have undergone aging, fermenting, pickling or smoking. These include: Hotdogs,salami,Lox,sausage, mortadellas,smoked salmon, pepperoni, Pickled herring Pods of broad bean (English beans, Chinese pea pods, [place] (fava) beans, lima and navy beans Ripe avocado, ripe banana Yeast extracts or active yeast preparations such as Brewer's or Fleishman's (commercial bakes goods are permitted) Tomato based foods, pizza (lasagna, etc.)   MSG (monosodium glutamate) is disguised as many things; look for these common aliases: Monopotassium  glutamate Autolysed yeast Hydrolysed protein Sodium caseinate "flavorings" "all natural preservatives" Nutrasweet   Avoid all other foods that convincingly provoke headaches.   Resources: The Dizzy [place]air Laundry Your Headache Diet, migrainestrong.com  https://zamora-andrews.com/   Caffeine and Migraine For patients that have migraine, caffeine intake more than 3 days per week can lead to dependency and increased migraine frequency. I would recommend cutting back on your caffeine intake as best you can. The recommended amount of caffeine is 200-300 mg daily, although migraine patients may experience dependency at even lower doses. While you may notice an increase in headache temporarily, cutting back will be helpful for headaches in the long run. For more information on caffeine and migraine, visit: https://americanmigrainefoundation.org/resource-library/caffeine-and-migraine/   Headache Prevention Strategies:   1. Maintain a headache diary; learn to identify and avoid triggers.  - This can be a simple note where you log when you had a headache, associated symptoms, and medications used - There are several smartphone apps developed to help track migraines: Migraine Buddy, Migraine Monitor, Curelator N1-Headache App   Common triggers include: Emotional triggers: Emotional/Upset family or friends Emotional/Upset occupation Business reversal/success Anticipation anxiety Crisis-serious Post-crisis periodNew job/position   Physical triggers: Vacation Day Weekend Strenuous Exercise High Altitude Location New Move Menstrual Day Physical Illness Oversleep/Not enough sleep Weather changes Light: Photophobia or light sesnitivity treatment involves  a balance between desensitization and reduction in overly strong input. Use dark polarized glasses outside, but not inside. Avoid bright or fluorescent light, but do not dim environment to the point  that going into a normally lit room hurts. Consider FL-41 tint lenses, which reduce the most irritating wavelengths without blocking too much light.  These can be obtained at axonoptics.com or theraspecs.com Foods: see list above.   2. Limit use of acute treatments (over-the-counter medications, triptans, etc.) to no more than 2 days per week or 10 days per month to prevent medication overuse headache (rebound headache).     3. Follow a regular schedule (including weekends and holidays): Don't skip meals. Eat a balanced diet. 8 hours of sleep nightly. Minimize stress. Exercise 30 minutes per day. Being overweight is associated with a 5 times increased risk of chronic migraine. Keep well hydrated and drink 6-8 glasses of water per day.   4. Initiate non-pharmacologic measures at the earliest onset of your headache. Rest and quiet environment. Relax and reduce stress. Breathe2Relax is a free app that can instruct you on    some simple relaxtion and breathing techniques. Http://Dawnbuse.com is a    free website that provides teaching videos on relaxation.  Also, there are  many apps that   can be downloaded for "mindful" relaxation.  An app called YOGA NIDRA will help walk you through mindfulness. Another app called Calm can be downloaded to give you a structured mindfulness guide with daily reminders and skill development. Headspace for guided meditation Mindfulness Based Stress Reduction Online Course: www.palousemindfulness.com Cold compresses.   5. Don't wait!! Take the maximum allowable dosage of prescribed medication at the first sign of migraine.   6. Compliance:  Take prescribed medication regularly as directed and at the first sign of a migraine.   7. Communicate:  Call your physician when problems arise, especially if your headaches change, increase in frequency/severity, or become associated with neurological symptoms (weakness, numbness, slurred speech, etc.). Proceed to emergency room  if you experience new or worsening symptoms or symptoms do not resolve, if you have new neurologic symptoms or if headache is severe, or for any concerning symptom.   8. Headache/pain management therapies: Consider various complementary methods, including medication, behavioral therapy, psychological counselling, biofeedback, massage therapy, acupuncture, dry needling, and other modalities.  Such measures may reduce the need for medications. Counseling for pain management, where patients learn to function and ignore/minimize their pain, seems to work very well.   9. Recommend changing family's attention and focus away from patient's headaches. Instead, emphasize daily activities. If first question of day is 'How are your headaches/Do you have a headache today?', then patient will constantly think about headaches, thus making them worse. Goal is to re-direct attention away from headaches, toward daily activities and other distractions.   10. Helpful Websites: www.AmericanHeadacheSociety.org PatentHood.ch www.headaches.org TightMarket.nl www.achenet.org

## 2022-12-18 ENCOUNTER — Other Ambulatory Visit (HOSPITAL_COMMUNITY): Payer: Self-pay

## 2022-12-18 ENCOUNTER — Telehealth: Payer: Self-pay

## 2022-12-18 NOTE — Telephone Encounter (Signed)
Pharmacy Patient Advocate Encounter   Received notification from CoverMyMeds that prior authorization for Nurtec 75MG  dispersible tablets is required/requested.   Insurance verification completed.   The patient is insured through [facility] .   Per test claim: PA required; PA submitted to [employer] via CoverMyMeds Key/confirmation #/EOC [id number] Status is pending

## 2022-12-24 NOTE — Telephone Encounter (Signed)
Pharmacy Patient Advocate Encounter  Received notification from [facility] that Prior Authorization for Nurtec 75MG  dispersible tablets has been DENIED.  Full denial letter will be uploaded to the media tab. See denial reason below.  Here are the policy requirements your request did not meet:  Your doctor must tell us the following:  You didn't have more than 15 migraine days per month in the last 6 months You will not be taking this drug with a strong CYP3A4 inhibitor (a type of drug that could increase the amount of the drug asked for in your body to an unsafe level). You do not have end-stage kidney disease (This is when your creatinine clearance (CrCl) less than 15 mL/min, this is a measurement which tells your doctor how well your kidneys are working)  PA #/Case ID/Reference #: [id number]

## 2023-01-01 NOTE — Assessment & Plan Note (Signed)
Well controlled.  No medicines.  Continue to work on eating a healthy diet and exercise.  Labs drawn today.   

## 2023-01-01 NOTE — Progress Notes (Unsigned)
Subjective:  Patient ID: Betty Rogers, female    DOB: 29-Apr-1995  Age: 27 y.o. MRN: [id number]  Chief Complaint  Patient presents with   Medical Management of Chronic Issues   HPI   Depression/Autism Spectrum: Patient is taking Buspar 10 mg twice a day, Fluoxetine 20 mg daily, Lorazepam 1 mg twice a day, Abilify 5 mg daily Patient sees psychiatry, Garnett [place], NP.    Migraine: Taking Nurtec 75 mg daily PRN, Propranolol 80 mg daily at bedtime, Emgallity once monthly. Working great. On depakote 250 mg daily. Has not had a full blown migraine in approx 5 months.   PCOS: implant   GERD: Taking Omeprazole 20 mg twice a day        01/02/2023    9:40 AM 09/19/2022   10:23 AM 05/27/2022   11:01 AM 02/14/2022    9:27 AM 11/07/2021    9:01 AM  Depression screen PHQ 2/9  Decreased Interest 1 1 3  0 0  Down, Depressed, Hopeless 0 0 3 0 0  PHQ - 2 Score 1 1 6  0 0  Altered sleeping 2 1 3 1    Tired, decreased energy 2 1 3 1    Change in appetite 1 0 3 1   Feeling bad or failure about yourself  0 1 0 0   Trouble concentrating 0 0 0 0   Moving slowly or fidgety/restless 0  2 0   Suicidal thoughts 0 0 0 0   PHQ-9 Score 6 4 17 3    Difficult doing work/chores Somewhat difficult Somewhat difficult Very difficult Not difficult at all         09/19/2022   10:23 AM  Fall Risk   Falls in the past year? 0  Number falls in past yr: 0  Injury with Fall? 0  Risk for fall due to : No Fall Risks  Follow up Falls evaluation completed    Patient Care Team: Blane Ohara, MD as PCP - General (Family Medicine) Misenheimer, Marcial Pacas, MD as Consulting Physician (Unknown Physician Specialty)   Review of Systems  Constitutional:  Negative for chills, fatigue and fever.  HENT:  Negative for congestion, ear pain, rhinorrhea and sore throat.   Respiratory:  Negative for cough and shortness of breath.   Cardiovascular:  Negative for chest pain.  Gastrointestinal:  Negative for abdominal pain,  constipation, diarrhea, nausea and vomiting.  Genitourinary:  Negative for dysuria and urgency.  Musculoskeletal:  Negative for back pain and myalgias.  Neurological:  Negative for dizziness, weakness, light-headedness and headaches.  Psychiatric/Behavioral:  Negative for dysphoric mood. The patient is not nervous/anxious.     Current Outpatient Medications on File Prior to Visit  Medication Sig Dispense Refill   ARIPiprazole (ABILIFY) 5 MG tablet Take 5 mg by mouth at bedtime.     busPIRone (BUSPAR) 10 MG tablet Take 10 mg by mouth 2 (two) times daily.     etonogestrel (NEXPLANON) 68 MG IMPL implant 68 mg by Subdermal route. Every 3 years     FLUoxetine (PROZAC) 20 MG capsule Take 1 capsule (20 mg total) by mouth daily. 30 capsule 0   Galcanezumab-gnlm (EMGALITY) 120 MG/ML SOAJ INJECT 120MG  INTO THE SKIN EVERY 30 DAYS 3 mL 3   hydrOXYzine (VISTARIL) 25 MG capsule Take 25 mg by mouth at bedtime.     ondansetron (ZOFRAN-ODT) 4 MG disintegrating tablet Take 1-2 tablets (4-8 mg total) by mouth every 8 (eight) hours as needed. 30 tablet 6   pantoprazole (PROTONIX) 40 MG  tablet Take 40 mg by mouth every morning.     propranolol ER (INDERAL LA) 80 MG 24 hr capsule Take 1 capsule (80 mg total) by mouth at bedtime. 90 capsule 3   Rimegepant Sulfate (NURTEC) 75 MG TBDP Take 1 tablet (75 mg total) by mouth daily as needed. 8 tablet 11   No current facility-administered medications on file prior to visit.   Past Medical History:  Diagnosis Date   Agoraphobia 11/07/2016   Anxiety and depression    History of anxiety 03/29/2015   History of attention deficit hyperactivity disorder (ADHD) 03/29/2015   History of depression 03/29/2015   Migraines    Panic attacks    Psychosis (HCC) 07/18/2017   Past Surgical History:  Procedure Laterality Date   CHOLECYSTECTOMY     CHOLECYSTECTOMY     ESOPHAGOGASTRODUODENOSCOPY     REMOVAL BIRTH MARK ON HEAD   2008    Family History  Problem Relation Age of Onset    Heart attack Other    Cancer Other    Cancer Maternal Aunt        Pancreatic Stg II   Social History   Socioeconomic History   Marital status: Single    Spouse name: Not on file   Number of children: Not on file   Years of education: Not on file   Highest education level: High school graduate  Occupational History   Not on file  Tobacco Use   Smoking status: Never   Smokeless tobacco: Never  Vaping Use   Vaping status: Never Used  Substance and Sexual Activity   Alcohol use: Yes    Comment: "a half a wine cooler occasionally"   Drug use: Never   Sexual activity: Not Currently    Birth control/protection: Abstinence, Injection  Other Topics Concern   Not on file  Social History Narrative   Lives at home with her parents   Right handed   No caffeine   Social Determinants of Health   Financial Resource Strain: Low Risk  (09/19/2022)   Overall Financial Resource Strain (CARDIA)    Difficulty of Paying Living Expenses: Not hard at all  Food Insecurity: No Food Insecurity (09/19/2022)   Hunger Vital Sign    Worried About Running Out of Food in the Last Year: Never true    Ran Out of Food in the Last Year: Never true  Transportation Needs: No Transportation Needs (09/19/2022)   PRAPARE - Administrator, Civil Service (Medical): No    Lack of Transportation (Non-Medical): No  Physical Activity: Inactive (09/19/2022)   Exercise Vital Sign    Days of Exercise per Week: 0 days    Minutes of Exercise per Session: 0 min  Stress: No Stress Concern Present (09/19/2022)   [employer] of Occupational Health - Occupational Stress Questionnaire    Feeling of Stress : Not at all  Social Connections: Moderately Isolated (09/19/2022)   Social Connection and Isolation Panel [NHANES]    Frequency of Communication with Friends and Family: Three times a week    Frequency of Social Gatherings with Friends and Family: Three times a week    Attends Religious Services: 1 to 4  times per year    Active Member of Clubs or Organizations: No    Attends Banker Meetings: Never    Marital Status: Never married    Objective:  BP 122/62   Pulse 92   Temp (!) 97.2 F (36.2 C)   Ht 5'  4" (1.626 m)   Wt 194 lb (88 kg)   SpO2 98%   BMI 33.30 kg/m      01/02/2023    9:38 AM 09/30/2022    3:27 PM 09/24/2022    9:23 AM  BP/Weight  Systolic BP 122 99 90  Diastolic BP 62 66 70  Wt. (Lbs) 194 192.6   BMI 33.3 kg/m2 33.06 kg/m2     Physical Exam Vitals reviewed.  Constitutional:      Appearance: Normal appearance.  Cardiovascular:     Rate and Rhythm: Normal rate and regular rhythm.     Heart sounds: Normal heart sounds.  Pulmonary:     Effort: Pulmonary effort is normal. No respiratory distress.     Breath sounds: Normal breath sounds.  Neurological:     Mental Status: She is alert and oriented to person, place, and time.  Psychiatric:        Mood and Affect: Mood normal.        Behavior: Behavior normal.     Diabetic Foot Exam - Simple   No data filed      Lab Results  Component Value Date   WBC 6.9 09/19/2022   HGB 15.2 09/19/2022   HCT 44.7 09/19/2022   PLT 236 09/19/2022   GLUCOSE 83 01/02/2023   CHOL 160 01/02/2023   TRIG 88 01/02/2023   HDL 35 (L) 01/02/2023   LDLCALC 108 (H) 01/02/2023   ALT 17 01/02/2023   AST 17 01/02/2023   NA 145 (H) 01/02/2023   K 4.5 01/02/2023   CL 107 (H) 01/02/2023   CREATININE 0.81 01/02/2023   BUN 8 01/02/2023   CO2 23 01/02/2023   TSH 0.799 09/19/2022   INR 1.0 05/27/2022   HGBA1C 5.1 02/14/2022      Assessment & Plan:    Gastroesophageal reflux disease without esophagitis Assessment & Plan: Continue current medications.  Taking Omeprazole 20 mg twice a day     Mixed hyperlipidemia Assessment & Plan: Well controlled.  No medicines.  Continue to work on eating a healthy diet and exercise.  Labs drawn today.    Orders: -     Comprehensive metabolic panel -     Lipid  panel  Autism spectrum disorder Assessment & Plan: Continue to see psychiatry taking Buspar 10 mg twice a day, Fluoxetine 20 mg daily, Lorazepam 1 mg twice a day, Abilify 5 mg daily        No orders of the defined types were placed in this encounter.   Orders Placed This Encounter  Procedures   Comprehensive metabolic panel   Lipid panel     Follow-up: Return in about 6 months (around 07/02/2023) for chronic fasting.   I,Marla I Leal-Borjas,acting as a scribe for Blane Ohara, MD.,have documented all relevant documentation on the behalf of Blane Ohara, MD,as directed by  Blane Ohara, MD while in the presence of Blane Ohara, MD.   An After Visit Summary was printed and given to the patient.  Blane Ohara, MD [facility] [phone]

## 2023-01-01 NOTE — Assessment & Plan Note (Addendum)
Continue current medications.  Taking Omeprazole 20 mg twice a day

## 2023-01-01 NOTE — Assessment & Plan Note (Addendum)
Continue to see psychiatry taking Buspar 10 mg twice a day, Fluoxetine 20 mg daily, Lorazepam 1 mg twice a day, Abilify 5 mg daily

## 2023-01-02 ENCOUNTER — Ambulatory Visit (INDEPENDENT_AMBULATORY_CARE_PROVIDER_SITE_OTHER): Payer: MEDICAID | Admitting: Family Medicine

## 2023-01-02 ENCOUNTER — Encounter: Payer: Self-pay | Admitting: Family Medicine

## 2023-01-02 VITALS — BP 122/62 | HR 92 | Temp 97.2°F | Ht 64.0 in | Wt 194.0 lb

## 2023-01-02 DIAGNOSIS — F840 Autistic disorder: Secondary | ICD-10-CM | POA: Diagnosis not present

## 2023-01-02 DIAGNOSIS — E782 Mixed hyperlipidemia: Secondary | ICD-10-CM | POA: Diagnosis not present

## 2023-01-02 DIAGNOSIS — K219 Gastro-esophageal reflux disease without esophagitis: Secondary | ICD-10-CM

## 2023-01-02 LAB — COMPREHENSIVE METABOLIC PANEL
ALT: 17 [IU]/L (ref 0–32)
AST: 17 [IU]/L (ref 0–40)
Albumin: 4.5 g/dL (ref 4.0–5.0)
Alkaline Phosphatase: 76 [IU]/L (ref 44–121)
BUN/Creatinine Ratio: 10 (ref 9–23)
BUN: 8 mg/dL (ref 6–20)
Bilirubin Total: 1.5 mg/dL — ABNORMAL HIGH (ref 0.0–1.2)
CO2: 23 mmol/L (ref 20–29)
Calcium: 9.3 mg/dL (ref 8.7–10.2)
Chloride: 107 mmol/L — ABNORMAL HIGH (ref 96–106)
Creatinine, Ser: 0.81 mg/dL (ref 0.57–1.00)
Globulin, Total: 2 g/dL (ref 1.5–4.5)
Glucose: 83 mg/dL (ref 70–99)
Potassium: 4.5 mmol/L (ref 3.5–5.2)
Sodium: 145 mmol/L — ABNORMAL HIGH (ref 134–144)
Total Protein: 6.5 g/dL (ref 6.0–8.5)
eGFR: 101 mL/min/{1.73_m2} (ref >59)

## 2023-01-02 LAB — LIPID PANEL
Chol/HDL Ratio: 4.6 ratio — ABNORMAL HIGH (ref 0.0–4.4)
Cholesterol, Total: 160 mg/dL (ref 100–199)
HDL: 35 mg/dL — ABNORMAL LOW (ref >39)
LDL Chol Calc (NIH): 108 mg/dL — ABNORMAL HIGH (ref 0–99)
Triglycerides: 88 mg/dL (ref 0–149)
VLDL Cholesterol Cal: 17 mg/dL (ref 5–40)

## 2023-04-24 ENCOUNTER — Telehealth: Payer: Self-pay | Admitting: Pharmacist

## 2023-04-24 NOTE — Telephone Encounter (Signed)
Pharmacy Patient Advocate Encounter   Received notification from Patient Pharmacy that prior authorization for Emgality 120MG /ML auto-injectors (migraine) is required/requested.   Insurance verification completed.   The patient is insured through [facility] .   Per test claim: PA required; PA submitted to above mentioned insurance via CoverMyMeds Key/confirmation #/EOC [id number] Status is pending

## 2023-04-24 NOTE — Telephone Encounter (Signed)
Pharmacy Patient Advocate Encounter  Received notification from [facility] that Prior Authorization for Emgality 120MG /ML auto-injectors (migraine) has been APPROVED from 04/24/2023 to 04/23/2024   PA #/Case ID/Reference #:  [id number]

## 2023-05-12 ENCOUNTER — Other Ambulatory Visit: Payer: Self-pay | Admitting: Neurology

## 2023-05-12 DIAGNOSIS — M5481 Occipital neuralgia: Secondary | ICD-10-CM

## 2023-05-12 DIAGNOSIS — G43009 Migraine without aura, not intractable, without status migrainosus: Secondary | ICD-10-CM

## 2023-05-12 DIAGNOSIS — M542 Cervicalgia: Secondary | ICD-10-CM

## 2023-07-01 ENCOUNTER — Encounter: Payer: Self-pay | Admitting: Family Medicine

## 2023-07-01 NOTE — Progress Notes (Unsigned)
 Subjective:  Patient ID: Betty Rogers, female    DOB: 1995/08/10  Age: 28 y.o. MRN: [id number]  Chief Complaint  Patient presents with   Medical Management of Chronic Issues    HPI: Depression/Autism Spectrum: Patient is unsure of psychiatric medicines, but being managed by psychiatry. Patient sees psychiatry, Suezanne Emperor, NP. [facility] goes daily 5 days a week. Tremors: Cogentin 0.5mg  daily   Migraine: Taking Nurtec 75 mg daily PRN, Propranolol  80 mg daily at bedtime, Emgallity once monthly. Working great.    PCOS: nexplanon implant   GERD: Taking Omeprazole 20 mg twice a day. Saw gastroenterology at [facility]. Previous workup has included EGD, colonoscopy, CT, US , MRI, pancreatic elastase unremarkable (normal appearing pancreas on imaging). Given persistent elevated pancreatic markers. New GI doctor ordered with EUS for further evaluation, EGD with duodenal biopsies to rule out celiac disease. Checked ANA, TGL, and IgG4 today.   IBS: On bentyl 20 mg before meals and before bed.      01/02/2023    9:40 AM 09/19/2022   10:23 AM 05/27/2022   11:01 AM 02/14/2022    9:27 AM 11/07/2021    9:01 AM  Depression screen PHQ 2/9  Decreased Interest 1 1 3  0 0  Down, Depressed, Hopeless 0 0 3 0 0  PHQ - 2 Score 1 1 6  0 0  Altered sleeping 2 1 3 1    Tired, decreased energy 2 1 3 1    Change in appetite 1 0 3 1   Feeling bad or failure about yourself  0 1 0 0   Trouble concentrating 0 0 0 0   Moving slowly or fidgety/restless 0  2 0   Suicidal thoughts 0 0 0 0   PHQ-9 Score 6 4 17 3    Difficult doing work/chores Somewhat difficult Somewhat difficult Very difficult Not difficult at all         09/19/2022   10:23 AM  Fall Risk   Falls in the past year? 0  Number falls in past yr: 0  Injury with Fall? 0  Risk for fall due to : No Fall Risks  Follow up Falls evaluation completed    Patient Care Team: Mercy Stall, MD as PCP - General (Family Medicine) Misenheimer,  Emeterio Hansen, MD as Consulting Physician (Unknown Physician Specialty)   Review of Systems  Constitutional:  Negative for chills, fatigue and fever.  HENT:  Negative for congestion, ear pain, rhinorrhea and sore throat.   Respiratory:  Negative for cough and shortness of breath.   Cardiovascular:  Negative for chest pain.  Gastrointestinal:  Negative for abdominal pain, constipation, diarrhea, nausea and vomiting.  Genitourinary:  Negative for dysuria and urgency.  Musculoskeletal:  Negative for back pain and myalgias.  Neurological:  Negative for dizziness, weakness, light-headedness and headaches.  Psychiatric/Behavioral:  Negative for dysphoric mood. The patient is not nervous/anxious.     Current Outpatient Medications on File Prior to Visit  Medication Sig Dispense Refill   benztropine (COGENTIN) 0.5 MG tablet Take 0.5 mg by mouth every morning.     ARIPiprazole (ABILIFY) 5 MG tablet Take 5 mg by mouth at bedtime.     busPIRone  (BUSPAR ) 10 MG tablet Take 10 mg by mouth 2 (two) times daily.     etonogestrel (NEXPLANON) 68 MG IMPL implant 68 mg by Subdermal route. Every 3 years     FLUoxetine  (PROZAC ) 20 MG capsule Take 1 capsule (20 mg total) by mouth daily. 30 capsule 0   Galcanezumab -gnlm (  EMGALITY ) 120 MG/ML SOAJ INJECT 120MG  INTO THE SKIN EVERY 30 DAYS 3 mL 3   hydrOXYzine  (VISTARIL ) 25 MG capsule Take 25 mg by mouth at bedtime.     ondansetron  (ZOFRAN -ODT) 4 MG disintegrating tablet TAKE 1 OR 2 TABLETS BY MOUTH EVERY 8 HOURS AS NEEDED 30 tablet 6   pantoprazole (PROTONIX) 40 MG tablet Take 40 mg by mouth every morning.     propranolol  ER (INDERAL  LA) 80 MG 24 hr capsule Take 1 capsule (80 mg total) by mouth at bedtime. 90 capsule 3   Rimegepant Sulfate (NURTEC) 75 MG TBDP Take 1 tablet (75 mg total) by mouth daily as needed. 8 tablet 11   No current facility-administered medications on file prior to visit.   Past Medical History:  Diagnosis Date   Abdominal pain, generalized  03/28/2022   Agoraphobia 11/07/2016   Anxiety and depression    History of anxiety 03/29/2015   History of attention deficit hyperactivity disorder (ADHD) 03/29/2015   History of depression 03/29/2015   Migraines    Panic attacks    Psychosis (HCC) 07/18/2017   Past Surgical History:  Procedure Laterality Date   CHOLECYSTECTOMY     CHOLECYSTECTOMY     ESOPHAGOGASTRODUODENOSCOPY     REMOVAL BIRTH MARK ON HEAD   2008    Family History  Problem Relation Age of Onset   Heart attack Other    Cancer Other    Cancer Maternal Aunt        Pancreatic Stg II   Social History   Socioeconomic History   Marital status: Single    Spouse name: Not on file   Number of children: Not on file   Years of education: Not on file   Highest education level: High school graduate  Occupational History   Not on file  Tobacco Use   Smoking status: Never   Smokeless tobacco: Never  Vaping Use   Vaping status: Never Used  Substance and Sexual Activity   Alcohol use: Yes    Comment: "a half a wine cooler occasionally"   Drug use: Never   Sexual activity: Not Currently    Birth control/protection: Abstinence, Injection  Other Topics Concern   Not on file  Social History Narrative   Lives at home with her parents   Right handed   No caffeine   Social Drivers of Corporate investment banker Strain: Low Risk  (09/19/2022)   Overall Financial Resource Strain (CARDIA)    Difficulty of Paying Living Expenses: Not hard at all  Food Insecurity: No Food Insecurity (09/19/2022)   Hunger Vital Sign    Worried About Running Out of Food in the Last Year: Never true    Ran Out of Food in the Last Year: Never true  Transportation Needs: No Transportation Needs (09/19/2022)   PRAPARE - Administrator, Civil Service (Medical): No    Lack of Transportation (Non-Medical): No  Physical Activity: Inactive (09/19/2022)   Exercise Vital Sign    Days of Exercise per Week: 0 days    Minutes of  Exercise per Session: 0 min  Stress: No Stress Concern Present (09/19/2022)   [employer] of Occupational Health - Occupational Stress Questionnaire    Feeling of Stress : Not at all  Social Connections: Moderately Isolated (09/19/2022)   Social Connection and Isolation Panel [NHANES]    Frequency of Communication with Friends and Family: Three times a week    Frequency of Social Gatherings with Friends and  Family: Three times a week    Attends Religious Services: 1 to 4 times per year    Active Member of Clubs or Organizations: No    Attends Banker Meetings: Never    Marital Status: Never married    Objective:  BP 108/64   Pulse 100   Temp 97.9 F (36.6 C)   Ht 5\' 4"  (1.626 m)   Wt 204 lb (92.5 kg)   SpO2 97%   BMI 35.02 kg/m      07/02/2023    9:17 AM 01/02/2023    9:38 AM 09/30/2022    3:27 PM  BP/Weight  Systolic BP 108 122 99  Diastolic BP 64 62 66  Wt. (Lbs) 204 194 192.6  BMI 35.02 kg/m2 33.3 kg/m2 33.06 kg/m2    Physical Exam Vitals reviewed.  Constitutional:      Appearance: Normal appearance.  Neck:     Vascular: No carotid bruit.  Cardiovascular:     Rate and Rhythm: Normal rate and regular rhythm.     Heart sounds: Normal heart sounds.  Pulmonary:     Effort: Pulmonary effort is normal. No respiratory distress.     Breath sounds: Normal breath sounds.  Abdominal:     General: Abdomen is flat. Bowel sounds are normal.     Palpations: Abdomen is soft.     Tenderness: There is no abdominal tenderness.  Neurological:     Mental Status: She is alert and oriented to person, place, and time.  Psychiatric:        Mood and Affect: Mood normal.        Behavior: Behavior normal.     Diabetic Foot Exam - Simple   No data filed      Lab Results  Component Value Date   WBC 6.9 09/19/2022   HGB 15.2 09/19/2022   HCT 44.7 09/19/2022   PLT 236 09/19/2022   GLUCOSE 83 01/02/2023   CHOL 160 01/02/2023   TRIG 88 01/02/2023   HDL 35 (L)  01/02/2023   LDLCALC 108 (H) 01/02/2023   ALT 17 01/02/2023   AST 17 01/02/2023   NA 145 (H) 01/02/2023   K 4.5 01/02/2023   CL 107 (H) 01/02/2023   CREATININE 0.81 01/02/2023   BUN 8 01/02/2023   CO2 23 01/02/2023   TSH 0.799 09/19/2022   INR 1.0 05/27/2022   HGBA1C 5.1 02/14/2022      Assessment & Plan:  Mixed hyperlipidemia Assessment & Plan: Well controlled.  No medicines.  Continue to work on eating a healthy diet and exercise.     Autism spectrum disorder Assessment & Plan: Continue to see psychiatry taking Buspar  10 mg twice a day, Fluoxetine  20 mg daily, Lorazepam  1 mg twice a day, Abilify 5 mg daily     Migraine without aura, with intractable migraine, so stated, with status migrainosus Assessment & Plan: The current medical regimen is effective;  continue present plan and medications.  Taking Nurtec 75 mg daily PRN, Propranolol  80 mg daily at bedtime, Emgallity once monthly.    Gastroesophageal reflux disease without esophagitis Assessment & Plan: Continue current medications.  Taking Omeprazole 20 mg twice a day     PCOS (polycystic ovarian syndrome) Assessment & Plan: Implant      No orders of the defined types were placed in this encounter.   No orders of the defined types were placed in this encounter.    Follow-up: Return in about 6 months (around 01/02/2024) for chronic follow  up.   Gladys Lamp I Leal-Borjas,acting as a scribe for Mercy Stall, MD.,have documented all relevant documentation on the behalf of Mercy Stall, MD,as directed by  Mercy Stall, MD while in the presence of Mercy Stall, MD.   An After Visit Summary was printed and given to the patient.  I attest that I have reviewed this visit and agree with the plan scribed by my staff.   Mercy Stall, MD [facility] [phone]

## 2023-07-02 ENCOUNTER — Encounter: Payer: Self-pay | Admitting: Family Medicine

## 2023-07-02 ENCOUNTER — Ambulatory Visit (INDEPENDENT_AMBULATORY_CARE_PROVIDER_SITE_OTHER): Payer: MEDICAID | Admitting: Family Medicine

## 2023-07-02 VITALS — BP 108/64 | HR 100 | Temp 97.9°F | Ht 64.0 in | Wt 204.0 lb

## 2023-07-02 DIAGNOSIS — K219 Gastro-esophageal reflux disease without esophagitis: Secondary | ICD-10-CM

## 2023-07-02 DIAGNOSIS — E282 Polycystic ovarian syndrome: Secondary | ICD-10-CM

## 2023-07-02 DIAGNOSIS — F840 Autistic disorder: Secondary | ICD-10-CM | POA: Diagnosis not present

## 2023-07-02 DIAGNOSIS — E782 Mixed hyperlipidemia: Secondary | ICD-10-CM | POA: Diagnosis not present

## 2023-07-02 DIAGNOSIS — G43011 Migraine without aura, intractable, with status migrainosus: Secondary | ICD-10-CM | POA: Diagnosis not present

## 2023-07-04 NOTE — Assessment & Plan Note (Signed)
The current medical regimen is effective;  continue present plan and medications.  Taking Nurtec 75 mg daily PRN, Propranolol 80 mg daily at bedtime, Emgallity once monthly.

## 2023-07-04 NOTE — Assessment & Plan Note (Signed)
 Continue to see psychiatry taking Buspar 10 mg twice a day, Fluoxetine 20 mg daily, Lorazepam 1 mg twice a day, Abilify 5 mg daily

## 2023-07-04 NOTE — Assessment & Plan Note (Signed)
 Continue current medications.  Taking Omeprazole 20 mg twice a day

## 2023-07-04 NOTE — Assessment & Plan Note (Signed)
Implant

## 2023-07-04 NOTE — Assessment & Plan Note (Signed)
 Well controlled.  No medicines.  Continue to work on eating a healthy diet and exercise.

## 2023-07-22 ENCOUNTER — Telehealth: Payer: Self-pay | Admitting: Family Medicine

## 2023-07-22 NOTE — Telephone Encounter (Signed)
 LVM and sent mychart msg informing pt of need to reschedule 09/21/23 appt - NP out

## 2023-09-21 ENCOUNTER — Ambulatory Visit: Payer: MEDICAID | Admitting: Family Medicine

## 2023-09-22 NOTE — Progress Notes (Unsigned)
 No chief complaint on file.   HISTORY OF PRESENT ILLNESS:  09/22/23 ALL:  Betty Rogers returns for follow up for migraines. She was last seen 09/2022 and doing well on Emgality , propranolol  and Nurtec. Since,   09/30/2022 ALL:  Betty Rogers is a 28 y.o. female here today for follow up for migraines. She continues Emgality , propranolol  80mg  QD and Nurtec PRN. She reports migraines are well managed. She has not had a migraine in about 3 months. She does take Nurtec as soon as she feels a headache coming on, averaging 4-6 times a month. She continues to have a generalized tremor. She describes it as feeling internal shaking. She reports shaking of whole body at times but worse in hands and head. Seems worse when she goes out in public and at night when asleep. She feels mood is well managed. She continues to see Idell Remington and a counselor regularly. No longer taking divalproex , has been off for years. She reports two recent bouts of pancreatitis. No clear cause. No alcohol use. LDL 103, Triglycerides normal. No obvious medication links. No clear dietary changes. She is seeing GI in 2 days.   12/24/20 ALL (Mychart): Betty Rogers returns for migraine follow up. She feels that she is doing fairly well. She may have 3-4 migraines per month. She continues Emgality  and propranolol . Rizatriptan  helps with abortive therapy but makes her sleepy. She may use 3-4 per month. Advil  helps with milder headaches. She recently quit her job due to panic attacks and increased migraines. She feels that she is doing better now.   11/15/2019 ALL (Mychart):  Betty Rogers is a 28 y.o. female here today for follow up for migraines.  She continues Emgality  monthly and propranolol  every day at bedtime.  She reports that she is doing very well.  She may have 2 migraine days per month.  Rizatriptan  helps to abort migraines.  She is feeling well and without concerns.  She continues an adult camp throughout the week.  Mother is primary  caregiver.   REVIEW OF SYSTEMS: Out of a complete 14 system review of symptoms, the patient complains only of the following symptoms, tremor, headaches, abdominal pain, anxiety, depression, and all other reviewed systems are negative.   ALLERGIES: Allergies  Allergen Reactions   Amitriptyline     Worse headaches   Milk (Cow)    Latex Rash   Nickel Rash   Other Itching    Metal      HOME MEDICATIONS: Outpatient Medications Prior to Visit  Medication Sig Dispense Refill   ARIPiprazole (ABILIFY) 5 MG tablet Take 5 mg by mouth at bedtime.     benztropine (COGENTIN) 0.5 MG tablet Take 0.5 mg by mouth every morning.     busPIRone  (BUSPAR ) 10 MG tablet Take 10 mg by mouth 2 (two) times daily.     etonogestrel (NEXPLANON) 68 MG IMPL implant 68 mg by Subdermal route. Every 3 years     FLUoxetine  (PROZAC ) 20 MG capsule Take 1 capsule (20 mg total) by mouth daily. 30 capsule 0   Galcanezumab -gnlm (EMGALITY ) 120 MG/ML SOAJ INJECT 120MG  INTO THE SKIN EVERY 30 DAYS 3 mL 3   hydrOXYzine  (VISTARIL ) 25 MG capsule Take 25 mg by mouth at bedtime.     ondansetron  (ZOFRAN -ODT) 4 MG disintegrating tablet TAKE 1 OR 2 TABLETS BY MOUTH EVERY 8 HOURS AS NEEDED 30 tablet 6   pantoprazole (PROTONIX) 40 MG tablet Take 40 mg by mouth every morning.     propranolol  ER (  INDERAL  LA) 80 MG 24 hr capsule Take 1 capsule (80 mg total) by mouth at bedtime. 90 capsule 3   Rimegepant Sulfate (NURTEC) 75 MG TBDP Take 1 tablet (75 mg total) by mouth daily as needed. 8 tablet 11   No facility-administered medications prior to visit.     PAST MEDICAL HISTORY: Past Medical History:  Diagnosis Date   Abdominal pain, generalized 03/28/2022   Agoraphobia 11/07/2016   Anxiety and depression    History of anxiety 03/29/2015   History of attention deficit hyperactivity disorder (ADHD) 03/29/2015   History of depression 03/29/2015   Migraines    Panic attacks    Psychosis (HCC) 07/18/2017     PAST SURGICAL  HISTORY: Past Surgical History:  Procedure Laterality Date   CHOLECYSTECTOMY     CHOLECYSTECTOMY     ESOPHAGOGASTRODUODENOSCOPY     REMOVAL BIRTH MARK ON HEAD   2008     FAMILY HISTORY: Family History  Problem Relation Age of Onset   Heart attack Other    Cancer Other    Cancer Maternal Aunt        Pancreatic Stg II     SOCIAL HISTORY: Social History   Socioeconomic History   Marital status: Single    Spouse name: Not on file   Number of children: Not on file   Years of education: Not on file   Highest education level: High school graduate  Occupational History   Not on file  Tobacco Use   Smoking status: Never   Smokeless tobacco: Never  Vaping Use   Vaping status: Never Used  Substance and Sexual Activity   Alcohol use: Yes    Comment: a half a wine cooler occasionally   Drug use: Never   Sexual activity: Not Currently    Birth control/protection: Abstinence, Injection  Other Topics Concern   Not on file  Social History Narrative   Lives at home with her parents   Right handed   No caffeine   Social Drivers of Corporate investment banker Strain: Low Risk  (09/19/2022)   Overall Financial Resource Strain (CARDIA)    Difficulty of Paying Living Expenses: Not hard at all  Food Insecurity: No Food Insecurity (09/19/2022)   Hunger Vital Sign    Worried About Running Out of Food in the Last Year: Never true    Ran Out of Food in the Last Year: Never true  Transportation Needs: No Transportation Needs (09/19/2022)   PRAPARE - Administrator, Civil Service (Medical): No    Lack of Transportation (Non-Medical): No  Physical Activity: Inactive (09/19/2022)   Exercise Vital Sign    Days of Exercise per Week: 0 days    Minutes of Exercise per Session: 0 min  Stress: No Stress Concern Present (09/19/2022)   [employer] of Occupational Health - Occupational Stress Questionnaire    Feeling of Stress : Not at all  Social Connections: Moderately  Isolated (09/19/2022)   Social Connection and Isolation Panel    Frequency of Communication with Friends and Family: Three times a week    Frequency of Social Gatherings with Friends and Family: Three times a week    Attends Religious Services: 1 to 4 times per year    Active Member of Clubs or Organizations: No    Attends Banker Meetings: Never    Marital Status: Never married  Intimate Partner Violence: Not At Risk (09/19/2022)   Humiliation, Afraid, Rape, and Kick questionnaire  Fear of Current or Ex-Partner: No    Emotionally Abused: No    Physically Abused: No    Sexually Abused: No     PHYSICAL EXAM  There were no vitals filed for this visit.  There is no height or weight on file to calculate BMI.  Generalized: Well developed, in no acute distress  Cardiology: normal rate and rhythm, no murmur auscultated  Respiratory: clear to auscultation bilaterally    Neurological examination  Mentation: Alert oriented to time, place, history taking. Follows all commands speech and language fluent Cranial nerve II-XII: Pupils were equal round reactive to light. Extraocular movements were full, visual field were full on confrontational test. Facial sensation and strength were normal. Head turning and shoulder shrug  were normal and symmetric. Motor: The motor testing reveals 5 over 5 strength of all 4 extremities. Good symmetric motor tone is noted throughout. Very slight/mild generalized tremor of bilateral hands and head noted off and on during exam. No action or postural tremor.  Sensory: Sensory testing is intact to soft touch on all 4 extremities. No evidence of extinction is noted.  Coordination: Cerebellar testing reveals good finger-nose-finger and heel-to-shin bilaterally.  Gait and station: Gait is normal.     DIAGNOSTIC DATA (LABS, IMAGING, TESTING) - I reviewed patient records, labs, notes, testing and imaging myself where available.  Lab Results  Component  Value Date   WBC 6.9 09/19/2022   HGB 15.2 09/19/2022   HCT 44.7 09/19/2022   MCV 86 09/19/2022   PLT 236 09/19/2022      Component Value Date/Time   NA 145 (H) 01/02/2023 1007   K 4.5 01/02/2023 1007   CL 107 (H) 01/02/2023 1007   CO2 23 01/02/2023 1007   GLUCOSE 83 01/02/2023 1007   GLUCOSE 99 07/18/2017 0535   BUN 8 01/02/2023 1007   CREATININE 0.81 01/02/2023 1007   CALCIUM 9.3 01/02/2023 1007   PROT 6.5 01/02/2023 1007   ALBUMIN 4.5 01/02/2023 1007   AST 17 01/02/2023 1007   ALT 17 01/02/2023 1007   ALKPHOS 76 01/02/2023 1007   BILITOT 1.5 (H) 01/02/2023 1007   GFRNONAA 86 03/26/2020 0833   GFRAA 99 03/26/2020 0833   Lab Results  Component Value Date   CHOL 160 01/02/2023   HDL 35 (L) 01/02/2023   LDLCALC 108 (H) 01/02/2023   TRIG 88 01/02/2023   CHOLHDL 4.6 (H) 01/02/2023   Lab Results  Component Value Date   HGBA1C 5.1 02/14/2022   No results found for: [id number] Lab Results  Component Value Date   TSH 0.799 09/19/2022        No data to display               No data to display           ASSESSMENT AND PLAN  28 y.o. year old female  has a past medical history of Abdominal pain, generalized (03/28/2022), Agoraphobia (11/07/2016), Anxiety and depression, History of anxiety (03/29/2015), History of attention deficit hyperactivity disorder (ADHD) (03/29/2015), History of depression (03/29/2015), Migraines, Panic attacks, and Psychosis (HCC) (07/18/2017). here with    No diagnosis found.   Betty Rogers is doing well from a migraine standpoint. We will continue Emgality , propranolol  and Nurtec as prescribed. We have discussed recent diagnosis of pancreatitis. I do not suspect migraine medications as potential contributors but am more than willing to reevaluate pending visit with GI. We have also discussed tremor. Not consistent with essential tremor. Could be related to mood.  May consider second opinion with an academic center if she wishes.  Healthy lifestyle habits encouraged. She will follow up with PCP as directed. She will return to see me in 1 year, sooner if needed. She verbalizes understanding and agreement with this plan.    No orders of the defined types were placed in this encounter.    No orders of the defined types were placed in this encounter.   I spent 30 minutes of face-to-face and non-face-to-face time with patient.  This included previsit chart review, lab review, study review, order entry, electronic health record documentation, patient education.   Greig Forbes, MSN, FNP-C 09/22/2023, 4:15 PM  [facility] [address] [place], [place] [address] [phone]

## 2023-09-22 NOTE — Patient Instructions (Signed)
 Below is our plan:  We will continue Emgality  every 30 days and propranolol  80mg  daily for now. We will switch Nurtec to Ubrelvy . Please take 1 tablet at onset of headache. May take 1 additional tablet in 2 hours if needed. Do not take more than 2 tablets in 24 hours or more than 10 in a month. We will give you a shot of Toradol  today in the office. Do not take any more Nsaids today.   If headaches are not improving in 2 weeks call and let me know. We will plan to switch Emgality  to Ajovy  at that time if needed.   Please make sure you are staying well hydrated. I recommend 50-60 ounces daily. Well balanced diet and regular exercise encouraged. Consistent sleep schedule with 6-8 hours recommended.   Please continue follow up with care team as directed.   Follow up with me in 1 year   You may receive a survey regarding today's visit. I encourage you to leave honest feed back as I do use this information to improve patient care. Thank you for seeing me today!   GENERAL HEADACHE INFORMATION:   Natural supplements: Magnesium Oxide or Magnesium Glycinate 500 mg at bed (up to 800 mg daily) Coenzyme Q10 300 mg in AM Vitamin B2- 200 mg twice a day   Add 1 supplement at a time since even natural supplements can have undesirable side effects. You can sometimes buy supplements cheaper (especially Coenzyme Q10) at www.WebmailGuide.co.za or at [facility].  Migraine with aura: There is increased risk for stroke in women with migraine with aura and a contraindication for the combined contraceptive pill for use by women who have migraine with aura. The risk for women with migraine without aura is lower. However other risk factors like smoking are far more likely to increase stroke risk than migraine. There is a recommendation for no smoking and for the use of OCPs without estrogen such as progestogen only pills particularly for women with migraine with aura.SABRA People who have migraine headaches with auras may be 3 times  more likely to have a stroke caused by a blood clot, compared to migraine patients who don't see auras. Women who take hormone-replacement therapy may be 30 percent more likely to suffer a clot-based stroke than women not taking medication containing estrogen. Other risk factors like smoking and high blood pressure may be  much more important.    Vitamins and herbs that show potential:   Magnesium: Magnesium (250 mg twice a day or 500 mg at bed) has a relaxant effect on smooth muscles such as blood vessels. Individuals suffering from frequent or daily headache usually have low magnesium levels which can be increase with daily supplementation of 400-750 mg. Three trials found 40-90% average headache reduction  when used as a preventative. Magnesium may help with headaches are aura, the best evidence for magnesium is for migraine with aura is its thought to stop the cortical spreading depression we believe is the pathophysiology of migraine aura.Magnesium also demonstrated the benefit in menstrually related migraine.  Magnesium is part of the messenger system in the serotonin cascade and it is a good muscle relaxant.  It is also useful for constipation which can be a side effect of other medications used to treat migraine. Good sources include nuts, whole grains, and tomatoes. Side Effects: loose stool/diarrhea  Riboflavin (vitamin B 2) 200 mg twice a day. This vitamin assists nerve cells in the production of ATP a principal energy storing molecule.  It  is necessary for many chemical reactions in the body.  There have been at least 3 clinical trials of riboflavin using 400 mg per day all of which suggested that migraine frequency can be decreased.  All 3 trials showed significant improvement in over half of migraine sufferers.  The supplement is found in bread, cereal, milk, meat, and poultry.  Most Americans get more riboflavin than the recommended daily allowance, however riboflavin deficiency is not necessary  for the supplements to help prevent headache. Side effects: energizing, green urine   Coenzyme Q10: This is present in almost all cells in the body and is critical component for the conversion of energy.  Recent studies have shown that a nutritional supplement of CoQ10 can reduce the frequency of migraine attacks by improving the energy production of cells as with riboflavin.  Doses of 150 mg twice a day have been shown to be effective.   Melatonin: Increasing evidence shows correlation between melatonin secretion and headache conditions.  Melatonin supplementation has decreased headache intensity and duration.  It is widely used as a sleep aid.  Sleep is natures way of dealing with migraine.  A dose of 3 mg is recommended to start for headaches including cluster headache. Higher doses up to 15 mg has been reviewed for use in Cluster headache and have been used. The rationale behind using melatonin for cluster is that many theories regarding the cause of Cluster headache center around the disruption of the normal circadian rhythm in the brain.  This helps restore the normal circadian rhythm.   HEADACHE DIET: Foods and beverages which may trigger migraine Note that only 20% of headache patients are food sensitive. You will know if you are food sensitive if you get a headache consistently 20 minutes to 2 hours after eating a certain food. Only cut out a food if it causes headaches, otherwise you might remove foods you enjoy! What matters most for diet is to eat a well balanced healthy diet full of vegetables and low fat protein, and to not miss meals.   Chocolate, other sweets ALL cheeses except cottage and cream cheese Dairy products, yogurt, sour cream, ice cream Liver Meat extracts (Bovril, Marmite, meat tenderizers) Meats or fish which have undergone aging, fermenting, pickling or smoking. These include: Hotdogs,salami,Lox,sausage, mortadellas,smoked salmon, pepperoni, Pickled herring Pods of  broad bean (English beans, Chinese pea pods, [place] (fava) beans, lima and navy beans Ripe avocado, ripe banana Yeast extracts or active yeast preparations such as Brewer's or Fleishman's (commercial bakes goods are permitted) Tomato based foods, pizza (lasagna, etc.)   MSG (monosodium glutamate) is disguised as many things; look for these common aliases: Monopotassium glutamate Autolysed yeast Hydrolysed protein Sodium caseinate "flavorings" "all natural preservatives Nutrasweet   Avoid all other foods that convincingly provoke headaches.   Resources: The Dizzy Bluford Aid Your Headache Diet, migrainestrong.com  https://zamora-andrews.com/   Caffeine and Migraine For patients that have migraine, caffeine intake more than 3 days per week can lead to dependency and increased migraine frequency. I would recommend cutting back on your caffeine intake as best you can. The recommended amount of caffeine is 200-300 mg daily, although migraine patients may experience dependency at even lower doses. While you may notice an increase in headache temporarily, cutting back will be helpful for headaches in the long run. For more information on caffeine and migraine, visit: https://americanmigrainefoundation.org/resource-library/caffeine-and-migraine/   Headache Prevention Strategies:   1. Maintain a headache diary; learn to identify and avoid triggers.  - This can be  a simple note where you log when you had a headache, associated symptoms, and medications used - There are several smartphone apps developed to help track migraines: Migraine Buddy, Migraine Monitor, Curelator N1-Headache App   Common triggers include: Emotional triggers: Emotional/Upset family or friends Emotional/Upset occupation Business reversal/success Anticipation anxiety Crisis-serious Post-crisis periodNew job/position   Physical triggers: Vacation Day Weekend Strenuous  Exercise High Altitude Location New Move Menstrual Day Physical Illness Oversleep/Not enough sleep Weather changes Light: Photophobia or light sesnitivity treatment involves a balance between desensitization and reduction in overly strong input. Use dark polarized glasses outside, but not inside. Avoid bright or fluorescent light, but do not dim environment to the point that going into a normally lit room hurts. Consider FL-41 tint lenses, which reduce the most irritating wavelengths without blocking too much light.  These can be obtained at axonoptics.com or theraspecs.com Foods: see list above.   2. Limit use of acute treatments (over-the-counter medications, triptans, etc.) to no more than 2 days per week or 10 days per month to prevent medication overuse headache (rebound headache).     3. Follow a regular schedule (including weekends and holidays): Don't skip meals. Eat a balanced diet. 8 hours of sleep nightly. Minimize stress. Exercise 30 minutes per day. Being overweight is associated with a 5 times increased risk of chronic migraine. Keep well hydrated and drink 6-8 glasses of water per day.   4. Initiate non-pharmacologic measures at the earliest onset of your headache. Rest and quiet environment. Relax and reduce stress. Breathe2Relax is a free app that can instruct you on    some simple relaxtion and breathing techniques. Http://Dawnbuse.com is a    free website that provides teaching videos on relaxation.  Also, there are  many apps that   can be downloaded for "mindful" relaxation.  An app called YOGA NIDRA will help walk you through mindfulness. Another app called Calm can be downloaded to give you a structured mindfulness guide with daily reminders and skill development. Headspace for guided meditation Mindfulness Based Stress Reduction Online Course: www.palousemindfulness.com Cold compresses.   5. Don't wait!! Take the maximum allowable dosage of prescribed medication at  the first sign of migraine.   6. Compliance:  Take prescribed medication regularly as directed and at the first sign of a migraine.   7. Communicate:  Call your physician when problems arise, especially if your headaches change, increase in frequency/severity, or become associated with neurological symptoms (weakness, numbness, slurred speech, etc.). Proceed to emergency room if you experience new or worsening symptoms or symptoms do not resolve, if you have new neurologic symptoms or if headache is severe, or for any concerning symptom.   8. Headache/pain management therapies: Consider various complementary methods, including medication, behavioral therapy, psychological counselling, biofeedback, massage therapy, acupuncture, dry needling, and other modalities.  Such measures may reduce the need for medications. Counseling for pain management, where patients learn to function and ignore/minimize their pain, seems to work very well.   9. Recommend changing family's attention and focus away from patient's headaches. Instead, emphasize daily activities. If first question of day is 'How are your headaches/Do you have a headache today?', then patient will constantly think about headaches, thus making them worse. Goal is to re-direct attention away from headaches, toward daily activities and other distractions.   10. Helpful Websites: www.AmericanHeadacheSociety.org PatentHood.ch www.headaches.org TightMarket.nl www.achenet.org

## 2023-09-23 ENCOUNTER — Telehealth: Payer: Self-pay | Admitting: Pharmacist

## 2023-09-23 ENCOUNTER — Ambulatory Visit: Payer: MEDICAID | Admitting: Family Medicine

## 2023-09-23 ENCOUNTER — Encounter: Payer: Self-pay | Admitting: Family Medicine

## 2023-09-23 VITALS — BP 110/70 | Ht 64.0 in | Wt 202.0 lb

## 2023-09-23 DIAGNOSIS — G43009 Migraine without aura, not intractable, without status migrainosus: Secondary | ICD-10-CM

## 2023-09-23 MED ORDER — ONDANSETRON 4 MG PO TBDP: 4.0000 mg | ORAL_TABLET | Freq: Three times a day (TID) | ORAL | 2 refills | Status: AC | PRN

## 2023-09-23 MED ORDER — KETOROLAC TROMETHAMINE 30 MG/ML IJ SOLN: 30.0000 mg | Freq: Once | INTRAMUSCULAR | Status: DC

## 2023-09-23 MED ORDER — KETOROLAC TROMETHAMINE 60 MG/2ML IM SOLN
30.0000 mg | Freq: Once | INTRAMUSCULAR | Status: AC
  Administered 2023-09-23: 30 mg via INTRAMUSCULAR

## 2023-09-23 MED ORDER — UBRELVY 100 MG PO TABS: 100.0000 mg | ORAL_TABLET | Freq: Every day | ORAL | 11 refills | Status: AC | PRN

## 2023-09-23 NOTE — Telephone Encounter (Signed)
 Pharmacy Patient Advocate Encounter   Received notification from Patient Pharmacy that prior authorization for Ubrelvy  100MG  tablets is required/requested.   Insurance verification completed.   The patient is insured through [facility] .   Per test claim: PA required; PA submitted to above mentioned insurance via CoverMyMeds Key/confirmation #/EOC [id number] Status is pending

## 2023-09-23 NOTE — Telephone Encounter (Signed)
 Pharmacy Patient Advocate Encounter  Received notification from [facility] that Prior Authorization for Ubrelvy  100MG  tablets has been APPROVED from 09/23/2023 to 09/22/2024   PA #/Case ID/Reference #:  [id number]

## 2023-10-05 ENCOUNTER — Other Ambulatory Visit: Payer: Self-pay

## 2023-10-05 DIAGNOSIS — G43009 Migraine without aura, not intractable, without status migrainosus: Secondary | ICD-10-CM

## 2023-10-05 MED ORDER — PROPRANOLOL HCL ER 80 MG PO CP24: 80.0000 mg | ORAL_CAPSULE | Freq: Every day | ORAL | 3 refills | Status: AC

## 2023-10-15 ENCOUNTER — Other Ambulatory Visit: Payer: Self-pay | Admitting: *Deleted

## 2023-10-15 ENCOUNTER — Encounter: Payer: Self-pay | Admitting: Family Medicine

## 2023-10-15 DIAGNOSIS — G43009 Migraine without aura, not intractable, without status migrainosus: Secondary | ICD-10-CM

## 2023-10-15 MED ORDER — AJOVY 225 MG/1.5ML ~~LOC~~ SOAJ: 1.5000 mL | SUBCUTANEOUS | 11 refills | Status: AC

## 2023-11-03 ENCOUNTER — Telehealth: Payer: Self-pay | Admitting: *Deleted

## 2023-11-03 ENCOUNTER — Telehealth: Payer: Self-pay

## 2023-11-03 ENCOUNTER — Other Ambulatory Visit: Payer: Self-pay | Admitting: *Deleted

## 2023-11-03 ENCOUNTER — Other Ambulatory Visit (HOSPITAL_COMMUNITY): Payer: Self-pay

## 2023-11-03 DIAGNOSIS — G43011 Migraine without aura, intractable, with status migrainosus: Secondary | ICD-10-CM

## 2023-11-03 NOTE — Telephone Encounter (Signed)
**Note De-identified  [place]oolbright Obfuscation** Please advise 

## 2023-11-03 NOTE — Telephone Encounter (Signed)
Sent mychart to pt, waiting on response.

## 2023-11-03 NOTE — Telephone Encounter (Signed)
 PA Ajovy  needed. Pt switched from Emgality  (ineffecitve).

## 2023-11-03 NOTE — Telephone Encounter (Signed)
 Pt has not. Spoke w/ Amy. This will need to be completed prior to starting on med. Placed order and sent mychart to pt.

## 2023-11-04 ENCOUNTER — Other Ambulatory Visit: Payer: Self-pay | Admitting: *Deleted

## 2023-11-04 ENCOUNTER — Telehealth: Payer: Self-pay | Admitting: Family Medicine

## 2023-11-04 DIAGNOSIS — G43011 Migraine without aura, intractable, with status migrainosus: Secondary | ICD-10-CM

## 2023-11-04 NOTE — Telephone Encounter (Signed)
 Patient's mother called, [facility] do not have lab orders. Was told by phone staff lab orders had been sent, but [facility] do not have for her to get a pregnancy test.  Informed nurse of the fax number.  Fax Number: [phone]

## 2023-11-04 NOTE — Addendum Note (Signed)
 Addended by: JOSHUA HERRING L on: 11/04/2023 10:04 AM   Modules accepted: Orders

## 2023-11-05 ENCOUNTER — Ambulatory Visit: Payer: Self-pay | Admitting: Family Medicine

## 2023-11-05 LAB — PREGNANCY, URINE: Preg Test, Ur: NEGATIVE

## 2023-11-06 ENCOUNTER — Telehealth: Payer: Self-pay | Admitting: Family Medicine

## 2023-11-06 NOTE — Telephone Encounter (Signed)
 Pharmacy Patient Advocate Encounter   Received notification from Patient Pharmacy that prior authorization for Ajovy  is required/requested.   Insurance verification completed.   The patient is insured through [facility] MEDICAID .   Per test claim: PA required; PA submitted to above mentioned insurance via Latent Key/confirmation #/EOC [id number] Status is pending

## 2023-11-06 NOTE — Telephone Encounter (Signed)
 Pt's mother called wanting to know the update on the Ajovy  P.A.  She was wondering if she should get a sample while she waits for the PA. Please advise.

## 2023-11-06 NOTE — Telephone Encounter (Signed)
 Called mother back and let her know that the PA for the Ajovy  was sent in on yesterday, and due to the office being closed today the PA will be completed Monday and then we will have to wait for insurance approval.   Will ask Amy on Monday morning for Ajovy  Sample.

## 2023-11-09 NOTE — Telephone Encounter (Signed)
 Phone room: please call mother back. PA Ajovy  approved from 11/06/2023 to 02/05/2024. They should now be able to pick this up from the pharmacy

## 2023-11-09 NOTE — Telephone Encounter (Signed)
 Can you call mother please? Not sure the phone room does this, thanks

## 2023-11-10 NOTE — Telephone Encounter (Signed)
 Called mother back at [phone].

## 2023-11-10 NOTE — Telephone Encounter (Signed)
 LVM for mother relayed Ajovy  approved and to call back if they have any issues picking up prescription from pharmacy.

## 2023-11-17 ENCOUNTER — Other Ambulatory Visit (HOSPITAL_COMMUNITY): Payer: Self-pay

## 2023-11-17 NOTE — Telephone Encounter (Signed)
 Pharmacy Patient Advocate Encounter  Received notification from [facility] MEDICAID that Prior Authorization for Ajovy  has been APPROVED from 11/06/23 to 02/05/24 Med already filled 11/10/23. Next fill 12/02/23  PA #/Case ID/Reference #: [id number]

## 2024-01-04 ENCOUNTER — Ambulatory Visit: Payer: MEDICAID | Admitting: Family Medicine

## 2024-01-27 ENCOUNTER — Ambulatory Visit: Payer: MEDICAID | Admitting: Family Medicine

## 2024-03-14 ENCOUNTER — Telehealth: Payer: Self-pay | Admitting: Family Medicine

## 2024-03-14 ENCOUNTER — Telehealth: Payer: Self-pay

## 2024-03-14 ENCOUNTER — Other Ambulatory Visit (HOSPITAL_COMMUNITY): Payer: Self-pay

## 2024-03-14 DIAGNOSIS — G43011 Migraine without aura, intractable, with status migrainosus: Secondary | ICD-10-CM

## 2024-03-14 NOTE — Telephone Encounter (Signed)
 Pharmacy Patient Advocate Encounter   Received notification from Physician's Office that prior authorization for Ubrelvy  is required/requested.   Insurance verification completed.   The patient is insured through [facility] MEDICAID.   Per test claim: The current 30 day co-pay is, $4.00.  No PA needed at this time. This test claim was processed through [facility]- copay amounts may vary at other pharmacies due to pharmacy/plan contracts, or as the patient moves through the different stages of their insurance plan.

## 2024-03-14 NOTE — Telephone Encounter (Signed)
 Pt's mother states  [facility]  Is telling her that a PA Is needed for AJOVY  225 MG/1.5ML SOAJ & Ubrogepant  (UBRELVY ) 100 MG TABS

## 2024-03-14 NOTE — Telephone Encounter (Signed)
 Hello Prescriber!  We are in the process of submitting a prior authorization for your patient for Ajovy . We are reaching out for clinical guidance for the following information to complete the request: Plan requiring documentation of clinical benefit since starting therapy.   Thank you! Pharmacy Team

## 2024-03-16 NOTE — Telephone Encounter (Signed)
 Pt called in and stated that she has had decrease in intensity and frequency of migraines/has and decrease cluster period. Pt has experienced improvement overall in therapy. Pt does use lifestyle modification (behavioral therapy, exercise, eating healthy). PT has the nexplanon.

## 2024-03-16 NOTE — Telephone Encounter (Signed)
 Lvm 1st attempt by hf1/14/26 to ak if pregnant or possiblility of pregnancy

## 2024-03-18 NOTE — Telephone Encounter (Signed)
 Pt mother called back  very frustrated and wishing to speak to nurse. Pt mother is not understating why Pt medication is not filled. I have explained that Pt medication  needs PA the patient mother  stated she has been waiting on a call back or a response but have not gotten any . Pt stated if  Pt needs Pregnancy test  , then an order need to be placed. Pt mother  is requesting to speak to Nurse . Pt mother stated  Pt is pain and may have to go to ER , Due to not having medication .( Please see Note  for Ajoy as well )  Ubrogepant  (UBRELVY ) 100 MG TABS  AJOVY  225 MG/1.5ML SOAJ

## 2024-03-18 NOTE — Telephone Encounter (Signed)
 I called and spoke to mother of pt.  Looks like the PA team is needing more information    Pt called in and stated that she has had decrease in intensity and frequency of migraines/has and decrease cluster period. Pt has experienced improvement overall in therapy. Pt does use lifestyle modification (behavioral therapy, exercise, eating healthy). PT has the nexplanon.   The provider is monitoring her pregnancy status.

## 2024-03-21 NOTE — Telephone Encounter (Signed)
 Pt mother called frustrated about not getting a call back . Informed Mother  I spoke with CMA . CMA has placed a order for Pt to get pregnancy test per on call MD  . Pt mother appreciated  the help will go get  Pt pregnancy test  and follow up about medication

## 2024-03-21 NOTE — Telephone Encounter (Signed)
 Patient called for a update on PA for Ajovy . Have been waiting for response. Could someone call me back.

## 2024-03-21 NOTE — Addendum Note (Signed)
 Addended by: ONEITA HOIST E on: 03/21/2024 02:51 PM   Modules accepted: Orders

## 2024-03-21 NOTE — Telephone Encounter (Signed)
 Ordered and advised [facility] to go to [facility] on [address] they should have a lab

## 2024-03-22 ENCOUNTER — Other Ambulatory Visit: Payer: Self-pay

## 2024-03-22 ENCOUNTER — Other Ambulatory Visit: Payer: MEDICAID

## 2024-03-22 ENCOUNTER — Other Ambulatory Visit (HOSPITAL_COMMUNITY): Payer: Self-pay

## 2024-03-22 ENCOUNTER — Telehealth: Payer: Self-pay

## 2024-03-22 DIAGNOSIS — G43011 Migraine without aura, intractable, with status migrainosus: Secondary | ICD-10-CM

## 2024-03-22 NOTE — Telephone Encounter (Signed)
 I called the pts mother and she was IRATE She stated that she has had issues from the start. The pts mother stated that Dr. INES would get an earful. I replied that she is no longer employed with us . The pts mother was in [place], mad, and disappointment and stated that she was not informed of this. From the start I stated that this may require a pregnancy test, I sent this to work in provider [facility]) and I asked the patient via mychart. The patient responded NOT AT ALL. I then routed to pa team for them to complete and they never responded. The patient then assumed that she needed one. I sent to Dr. Rosemarie (work in next day) and he approved a pregnancy test. I then ordered it to be done thru [facility] the day prior to this call. The patient then proceeded to go to their pcp DR. COX [facility] and they stated that they cannot do the urine pregnancy test because their provider at that office have to order it (like here we have a [facility] person on onsite) the patient's mother stated to them with me on the phone that why are you sending me to [facility] when you are a [facility] personnel? The mother began raising her voice at that person at Dr. Stacy office as she was not understanding why can't she get the test done that her daughter needs so that she has her meds. I checked to see if we could offer a sample and we had none onsite. They then drove to [facility] [address] but they were busy and the pt has root canal at 10 and they quickly realized they needed to scrap that idea and try again another day. This patient mother is wanting to stay on but requesting Dr. GREGG. They are sincerely upset that they were never notified of Dr. Sharion departure and that every step along the way. She appreciated my help and showed me sincere gratitude. She would like a call from upper management to discuss.

## 2024-03-22 NOTE — Telephone Encounter (Signed)
 Pharmacy Patient Advocate Encounter  Received notification from [facility] MEDICAID that Prior Authorization for AJOVY  (fremanezumab -vfrm) injection 225MG /1.5ML auto-injectors has been APPROVED from 03-22-2024 to 03-22-2025. Ran test claim, Copay is $4.00. This test claim was processed through [facility]- copay amounts may vary at other pharmacies due to pharmacy/plan contracts, or as the patient moves through the different stages of their insurance plan.   PA #/Case ID/Reference #: [id number]

## 2024-03-22 NOTE — Telephone Encounter (Signed)
 Noted.

## 2024-03-22 NOTE — Telephone Encounter (Signed)
 Called and spoke to pts mother and stated that the ajovy  was approved  from 03-22-2024 to 03-22-2025. Ran test claim, Copay is $4.00.  I tolf mother to not do the pregnancy test as the pa has been approved. Mother was so appreciative. I explained that there was a miscommunication and that I was extremely sorry for the inconvenience. I also mentioned to her that she should've received a mychart message when Dr. Ines left and that she had read it. She did not recall it. I did let her know that Dr. Gregg was assigned to her as requested and that seemed to help relieve her worry and frustration. The pts mother voiced gratitude and understanding to disregard the pregnancy test. She did wish to complain to a manager on how things was handled

## 2024-03-22 NOTE — Telephone Encounter (Signed)
 Pharmacy Patient Advocate Encounter   Received notification from [facility] that prior authorization for AJOVY  (fremanezumab -vfrm) injection 225MG /1.5ML auto-injectors is required/requested.   Insurance verification completed.   The patient is insured through [facility] MEDICAID.   Per test claim: PA required; PA submitted to above mentioned insurance via Latent Key/confirmation #/EOC [facility] Status is pending

## 2024-03-24 ENCOUNTER — Telehealth: Payer: Self-pay | Admitting: Adult Health

## 2024-03-24 NOTE — Telephone Encounter (Signed)
 I called the patient's mother left a voicemail she had requested to speak to a production designer, theatre/television/film.

## 2024-09-19 ENCOUNTER — Ambulatory Visit: Payer: MEDICAID | Admitting: Family Medicine

## 2024-09-22 ENCOUNTER — Ambulatory Visit: Payer: MEDICAID | Admitting: Family Medicine
